# Patient Record
Sex: Female | Born: 1945 | Race: White | Hispanic: No | Marital: Married | Smoking: Never smoker
Health system: Southern US, Community
[De-identification: ages and names within clinical notes are randomized; demographics above are authoritative.]

## PROBLEM LIST (undated history)

## (undated) DIAGNOSIS — I739 Peripheral vascular disease, unspecified: Secondary | ICD-10-CM

## (undated) DIAGNOSIS — I2699 Other pulmonary embolism without acute cor pulmonale: Secondary | ICD-10-CM

## (undated) DIAGNOSIS — Z87440 Personal history of urinary (tract) infections: Secondary | ICD-10-CM

## (undated) DIAGNOSIS — Z9289 Personal history of other medical treatment: Secondary | ICD-10-CM

## (undated) DIAGNOSIS — R569 Unspecified convulsions: Secondary | ICD-10-CM

## (undated) DIAGNOSIS — R51 Headache: Secondary | ICD-10-CM

## (undated) DIAGNOSIS — I82409 Acute embolism and thrombosis of unspecified deep veins of unspecified lower extremity: Secondary | ICD-10-CM

## (undated) HISTORY — DX: Personal history of other medical treatment: Z92.89

## (undated) HISTORY — DX: Other pulmonary embolism without acute cor pulmonale: I26.99

## (undated) HISTORY — PX: OTHER SURGICAL HISTORY: SHX169

---

## 1994-12-14 HISTORY — PX: KNEE SURGERY: SHX244

## 2003-12-15 DIAGNOSIS — I2699 Other pulmonary embolism without acute cor pulmonale: Secondary | ICD-10-CM

## 2003-12-15 DIAGNOSIS — I82409 Acute embolism and thrombosis of unspecified deep veins of unspecified lower extremity: Secondary | ICD-10-CM

## 2003-12-15 HISTORY — DX: Acute embolism and thrombosis of unspecified deep veins of unspecified lower extremity: I82.409

## 2003-12-15 HISTORY — DX: Other pulmonary embolism without acute cor pulmonale: I26.99

## 2008-09-24 ENCOUNTER — Emergency Department (HOSPITAL_COMMUNITY): Admission: EM | Admit: 2008-09-24 | Discharge: 2008-09-24 | Payer: Self-pay | Admitting: Emergency Medicine

## 2008-12-14 HISTORY — PX: INSERTION OF VENA CAVA FILTER: SHX5871

## 2009-03-08 ENCOUNTER — Inpatient Hospital Stay (HOSPITAL_COMMUNITY): Admission: EM | Admit: 2009-03-08 | Discharge: 2009-03-11 | Payer: Self-pay | Admitting: Emergency Medicine

## 2009-03-08 ENCOUNTER — Ambulatory Visit: Payer: Self-pay | Admitting: Vascular Surgery

## 2009-03-09 ENCOUNTER — Ambulatory Visit: Payer: Self-pay | Admitting: Surgery

## 2009-03-09 ENCOUNTER — Encounter (INDEPENDENT_AMBULATORY_CARE_PROVIDER_SITE_OTHER): Payer: Self-pay | Admitting: Internal Medicine

## 2009-03-11 ENCOUNTER — Encounter (INDEPENDENT_AMBULATORY_CARE_PROVIDER_SITE_OTHER): Payer: Self-pay | Admitting: Internal Medicine

## 2009-03-11 DIAGNOSIS — Z9289 Personal history of other medical treatment: Secondary | ICD-10-CM

## 2009-03-11 HISTORY — DX: Personal history of other medical treatment: Z92.89

## 2009-10-08 ENCOUNTER — Ambulatory Visit (HOSPITAL_COMMUNITY): Admission: RE | Admit: 2009-10-08 | Discharge: 2009-10-08 | Payer: Self-pay | Admitting: Cardiology

## 2009-10-11 ENCOUNTER — Ambulatory Visit (HOSPITAL_COMMUNITY): Admission: RE | Admit: 2009-10-11 | Discharge: 2009-10-11 | Payer: Self-pay | Admitting: Cardiology

## 2010-01-07 ENCOUNTER — Encounter: Admission: RE | Admit: 2010-01-07 | Discharge: 2010-01-07 | Payer: Self-pay | Admitting: Interventional Radiology

## 2010-03-13 ENCOUNTER — Ambulatory Visit: Payer: Self-pay | Admitting: Oncology

## 2010-03-20 ENCOUNTER — Encounter: Admission: RE | Admit: 2010-03-20 | Discharge: 2010-03-20 | Payer: Self-pay | Admitting: Obstetrics and Gynecology

## 2011-03-19 LAB — CREATININE, SERUM
Creatinine, Ser: 0.77 mg/dL (ref 0.4–1.2)
GFR calc Af Amer: >60 mL/min (ref >60)

## 2011-03-19 LAB — BASIC METABOLIC PANEL
BUN: 16 mg/dL (ref 6–23)
Calcium: 9.1 mg/dL (ref 8.4–10.5)
Creatinine, Ser: 0.83 mg/dL (ref 0.4–1.2)

## 2011-03-19 LAB — CBC
RBC: 4.14 MIL/uL (ref 3.87–5.11)
WBC: 5.4 10*3/uL (ref 4.0–10.5)

## 2011-03-19 LAB — BUN: BUN: 13 mg/dL (ref 6–23)

## 2011-03-26 LAB — LIPID PANEL
LDL Cholesterol: 137 mg/dL — ABNORMAL HIGH (ref 0–99)
Total CHOL/HDL Ratio: 2.5 RATIO
VLDL: 9 mg/dL (ref 0–40)

## 2011-03-26 LAB — BASIC METABOLIC PANEL
BUN: 15 mg/dL (ref 6–23)
CO2: 22 mEq/L (ref 19–32)
Calcium: 8.4 mg/dL (ref 8.4–10.5)
Calcium: 8.7 mg/dL (ref 8.4–10.5)
Chloride: 103 mEq/L (ref 96–112)
Creatinine, Ser: 0.58 mg/dL (ref 0.4–1.2)
Creatinine, Ser: 0.64 mg/dL (ref 0.4–1.2)
Creatinine, Ser: 0.81 mg/dL (ref 0.4–1.2)
GFR calc Af Amer: >60 mL/min (ref >60)
GFR calc Af Amer: >60 mL/min (ref >60)
GFR calc non Af Amer: >60 mL/min (ref >60)
GFR calc non Af Amer: >60 mL/min (ref >60)
GFR calc non Af Amer: >60 mL/min (ref >60)
Glucose, Bld: 88 mg/dL (ref 70–99)
Potassium: 4.3 mEq/L (ref 3.5–5.1)
Potassium: 4.3 mEq/L (ref 3.5–5.1)
Sodium: 134 mEq/L — ABNORMAL LOW (ref 135–145)

## 2011-03-26 LAB — CBC
HCT: 32.1 % — ABNORMAL LOW (ref 36.0–46.0)
HCT: 35.3 % — ABNORMAL LOW (ref 36.0–46.0)
Hemoglobin: 11.1 g/dL — ABNORMAL LOW (ref 12.0–15.0)
Hemoglobin: 11.8 g/dL — ABNORMAL LOW (ref 12.0–15.0)
MCHC: 33.3 g/dL (ref 30.0–36.0)
MCHC: 33.7 g/dL (ref 30.0–36.0)
MCV: 87.9 fL (ref 78.0–100.0)
Platelets: 287 10*3/uL (ref 150–400)
Platelets: 301 10*3/uL (ref 150–400)
RBC: 3.74 MIL/uL — ABNORMAL LOW (ref 3.87–5.11)
RBC: 4.06 MIL/uL (ref 3.87–5.11)
RDW: 13.9 % (ref 11.5–15.5)
RDW: 14.1 % (ref 11.5–15.5)
RDW: 14.1 % (ref 11.5–15.5)
WBC: 4.7 10*3/uL (ref 4.0–10.5)
WBC: 4.7 10*3/uL (ref 4.0–10.5)
WBC: 5.4 10*3/uL (ref 4.0–10.5)

## 2011-03-26 LAB — DIFFERENTIAL
Basophils Absolute: 0 10*3/uL (ref 0.0–0.1)
Eosinophils Absolute: 0.1 10*3/uL (ref 0.0–0.7)
Eosinophils Relative: 2 % (ref 0–5)
Lymphocytes Relative: 32 % (ref 12–46)
Lymphs Abs: 1.5 10*3/uL (ref 0.7–4.0)
Monocytes Absolute: 0.3 10*3/uL (ref 0.1–1.0)
Monocytes Relative: 8 % (ref 3–12)
Neutro Abs: 2.7 10*3/uL (ref 1.7–7.7)

## 2011-03-26 LAB — TSH: TSH: 1.459 u[IU]/mL (ref 0.350–4.500)

## 2011-03-26 LAB — PROTIME-INR
INR: 1.1 (ref 0.00–1.49)
INR: 1.2 (ref 0.00–1.49)
INR: 1.4 (ref 0.00–1.49)
Prothrombin Time: 13.9 seconds (ref 11.6–15.2)
Prothrombin Time: 14.7 seconds (ref 11.6–15.2)
Prothrombin Time: 15.3 seconds — ABNORMAL HIGH (ref 11.6–15.2)
Prothrombin Time: 17.4 seconds — ABNORMAL HIGH (ref 11.6–15.2)

## 2011-03-26 LAB — ANTIPHOSPHOLIPID SYNDROME EVAL, BLD
Anticardiolipin IgA: 11 [APL'U] — ABNORMAL LOW (ref <13)
Antiphosphatidylserine IgA: <20 APS U/mL (ref <20.0)
Antiphosphatidylserine IgM: <25 MPS U/mL (ref <25.0)
Lupus Anticoagulant: NOT DETECTED

## 2011-03-26 LAB — PROTEIN C, TOTAL: Protein C, Total: 108 % (ref 70–140)

## 2011-03-26 LAB — CARDIAC PANEL(CRET KIN+CKTOT+MB+TROPI)
CK, MB: 0.5 ng/mL (ref 0.3–4.0)
CK, MB: 0.6 ng/mL (ref 0.3–4.0)
Relative Index: INVALID (ref 0.0–2.5)
Relative Index: INVALID (ref 0.0–2.5)
Total CK: 39 U/L (ref 7–177)
Troponin I: 0.01 ng/mL (ref 0.00–0.06)

## 2011-03-26 LAB — POCT I-STAT, CHEM 8
BUN: 17 mg/dL (ref 6–23)
Calcium, Ion: 1.06 mmol/L — ABNORMAL LOW (ref 1.12–1.32)
Glucose, Bld: 91 mg/dL (ref 70–99)
HCT: 37 % (ref 36.0–46.0)
Potassium: 4.8 mEq/L (ref 3.5–5.1)

## 2011-03-26 LAB — CK TOTAL AND CKMB (NOT AT ARMC)
Relative Index: INVALID (ref 0.0–2.5)
Total CK: 50 U/L (ref 7–177)

## 2011-03-26 LAB — PROTEIN S, TOTAL: Protein S Ag, Total: 167 % — ABNORMAL HIGH (ref 70–140)

## 2011-03-26 LAB — TROPONIN I: Troponin I: <0.01 ng/mL (ref 0.00–0.06)

## 2011-03-26 LAB — FACTOR 5 LEIDEN

## 2011-04-28 NOTE — Procedures (Signed)
DUPLEX DEEP VENOUS EXAM - LOWER EXTREMITY   INDICATION:  Right lower extremity pain and swelling.   HISTORY:  Edema:  Yes.  Trauma/Surgery:  No.  Pain:  Yes.  PE:  No.  Previous DVT:  Yes.  Anticoagulants:  None.  Other:   DUPLEX EXAM:                CFV   SFV   PopV  PTV    GSV                R  L  R  L  R  L  R   L  R  L  Thrombosis    +  o  +     +     +      o  Spontaneous   0  +  0     0     0      +  Phasic        0  +  0     0     0      +  Augmentation  0  +  0     0     0      +  Compressible  0  +  0     0     0      +  Competent     0  +  0     0     0      +   Legend:  + - yes  o - no  p - partial  D - decreased   IMPRESSION:  Acute deep venous thrombosis noted in the right common  femoral vein, superficial femoral vein, popliteal, posterior tibial, and  the peroneal veins.   Notified Dr. Collins Scotland with results.    _____________________________  Di Kindle. Edilia Bo, M.D.   MG/MEDQ  D:  03/08/2009  T:  03/08/2009  Job:  [id number]

## 2011-04-28 NOTE — H&P (Signed)
Evelyn [name], Evelyn [name]              ACCOUNT NO.:  [id number]   MEDICAL RECORD NO.:  [id number]          PATIENT TYPE:  INP   LOCATION:  1832                         FACILITY:  [facility]   PHYSICIAN:  Eduard Clos, MDDATE OF BIRTH:  11-May-1946   DATE OF ADMISSION:  03/08/2009  DATE OF DISCHARGE:                              HISTORY & PHYSICAL   PRIMARY CARE PHYSICIAN:  At [facility].  She was seen yesterday by Dr. Herb Grays.   CHIEF COMPLAINT:  Swelling of the right lower extremity.   HISTORY OF PRESENT ILLNESS:  A 65 year old female with a history of  epilepsy since age 24, has been seizure free for the last 8 years,  history of previous DVT of the left lower extremity 4 years ago had gone  to Dr. Herb Grays yesterday after the patient was found to have  increased swelling in the right calf over the last 10 days.  The patient  originally had urinary respiratory tract infection over the last 64-month  that was treated with antibiotics.  She was very fatigued and was  spending most of the time in the bed.  She started noticing swelling in  the right lower extremity for the last week and a half and decided to go  to Dr. Herb Grays yesterday, who referred her to a vein specialist  where she had a Doppler done today morning which showed extensive clot  in the right lower extremity and was referred to the ER.   The patient also stated that she did have some pleuritic chest pain 3-  days ago, which resolved spontaneously.  The patient had a CT of the  chest which showed extensive bilateral pulmonary embolism.  The patient  has been admitted for further observation.  The patient personally  denies any chest pain, shortness of breath, denies any tachycardia,  dizziness, loss of consciousness, weakness of limbs.  Denies any  abdominal pain, nausea, vomiting, diarrhea, fever, chills or cough.   PAST MEDICAL HISTORY:  1. History of DVT 4 years ago.  She was treated with Coumadin for 3-4   months and was stopped.  2. History of epilepsy since age 47, seizure free for the last 8      years.   PAST SURGICAL HISTORY:  The patient had a left knee surgery.   MEDICATIONS PRIOR TO ADMISSION:  1. Topamax 75 mg in a.m. and 50 in p.m.  2. Mysoline 250 mg in a.m. and 1/2 in evening and 1 at night.  3. Lamictal 250 mg p.o. b.i.d.  4. Benadryl 25 mg p.o. at bedtime p.r.n. insomnia.   ALLERGIES:  TEGRETOL.   SOCIAL HISTORY:  The patient denies smoking cigarettes, drinking alcohol  or using illegal drugs.   FAMILY HISTORY:  Not significant for any pulmonary embolism, MI or CAD.   REVIEW OF SYSTEMS:  As per history of present illness, nothing else  significant.   PHYSICAL EXAMINATION:  GENERAL:  Patient examined at bedside, not in  acute distress.  VITAL SIGNS:  Blood pressure 134/76, pulse 80 per minute, temperature  97, respirations 18 per minute.  O2 saturations 100%.  HEENT:  Anicteric.  No pallor.  CHEST:  Bilateral air entry present.  No rhonchi, no crepitation.  HEART:  S1-S2 heard.  ABDOMEN:  Soft, nontender.  Bowel sounds heard.  CNS:  Alert, awake, oriented to time, place and person.  Moves upper and  lower extremities 5/5.  EXTREMITIES:  Peripheral pulses felt.  Mild swelling in the right lower  extremity.  Peripheral pulses felt.   LABORATORY DATA:  CT angio of the chest is positive for extensive  pulmonary embolism.  The patient had an outpatient Doppler of the lower  extremities; results are not available, but per ER physician it was  positive for right lower extremity DVT extensive.  CBC - WBC 4.7,  hemoglobin 12.6, hematocrit 37, platelets 310.  Basic metabolic panel;  sodium 134, potassium 4.8, chloride 109, glucose 91, BUN 17, creatinine  0.9.   ASSESSMENT:  1. Bilateral extensive pulmonary embolism.  The patient is      hemodynamically stable.  2. Right lower extremity deep venous thrombosis.  3. History of previous deep venous thrombosis in the left  lower      extremity.  4. Epilepsy, seizure free for 8 years.   PLAN:  Will admit patient to telemetry.  Start patient on Coumadin and  Lovenox.  Get a 2-D echo to check for any right ventricular strain or  pulmonary hypertension.  Will add labs for factor V Leiden, antithrombin  III, protein C, protein S, and antiphospholipid antibodies.  Further  recommendation as condition evolves.  I have explained to the patient as  this is the second episode, the patient will probably will need Coumadin  lifelong.      Eduard Clos, MD  Electronically Signed     ANK/MEDQ  D:  03/08/2009  T:  03/08/2009  Job:  [id number]

## 2011-04-28 NOTE — Discharge Summary (Signed)
Evelyn [name], Evelyn [name]              ACCOUNT NO.:  [id number]   MEDICAL RECORD NO.:  [id number]          PATIENT TYPE:  INP   LOCATION:  3736                         FACILITY:  [facility]   PHYSICIAN:  Isidor Holts, M.D.  DATE OF BIRTH:  [birth year]   DATE OF ADMISSION:  03/08/2009  DATE OF DISCHARGE:  03/11/2009                               DISCHARGE SUMMARY   PRIMARY MEDICAL DOCTOR:  Tammy R. Collins Scotland, M.D.   DISCHARGE DIAGNOSES:  1. Right lower extremity deep venous thrombosis/bilateral pulmonary      emboli, status post inferior vena cava filter, March 10, 2009.  2. History of previous left lower extremity deep venous thrombosis.  3. Seizure disorder.  4. Remote history of left knee surgery.   DISCHARGE MEDICATIONS:  1. Topamax 75 mg p.o. q.a.m. and 50 mg p.o. q.p.m.  2. Mysoline 250 mg p.o. q.a.m., 125 mg p.o. q.p.m., 250 mg p.o.      bedtime.  3. Lamictal 250 mg p.o. b.i.d.  4. Benadryl 25 mg p.o. p.r.n. at bedtime.  5. Darvocet-N 100 one p.o. p.r.n. q.6 h. A total of 20 pills have been      dispensed.  6. Coumadin 10 mg p.o. at 6 p.m. daily, otherwise per INR.  7. Lovenox 100 mg subcutaneously daily until INR equal 2-3 for 48      hours, then stop.   PROCEDURES:  1. Chest CT angiogram dated March 08, 2009.  This showed extensive      pulmonary embolus with large clot identified in the right main      pulmonary artery and descending right interlobar artery.  Clot is      also seen in the descending left intralobar artery.  Lungs      demonstrate only some slight atelectatic change, no nodule, mass or      consolidative process, airways unremarkable.  2. Bilateral lower extremity venous Doppler dated March 09, 2009.      This showed occlusive clot in the posterior right tibial vein,      popliteal profunda and femoral veins.  The common femoral vein is      partially occluded.  No DVT was noted in the left lower extremity.  3. Ultrasound-guided IVC filter placement done March 10, 2009 by Dr.      Richarda Overlie, interventional radiologist.  This was an uncomplicated      procedure with satisfactory deployment of retrievable IVC filter.   CONSULTATIONS:  Dr. Abundio Miu, interventional radiologist.   ADMISSION HISTORY:  As in H and P note of March 08, 2009 dictated by Dr.  Midge Minium.  However, in brief, this 65 year old female, with  known history of seizure disorder diagnosed since age 64 years, now  seizure-free for the past 8 years, history of remote left knee surgery  approximately 10 years ago, status post DVT left lower extremity  approximately 4-5 years ago, treated with Coumadin anticoagulation for  about 3-4 months, now presenting with right calf swelling of  approximately 10 days duration.  She had experienced pleuritic-type  chest pain approximately 3 days ago, which has since  resolved.  She was  seen by her primary MD, Dr. Herb Grays, who then referred her to a vein  specialist, where she had the left Doppler done in the a.m. of March 08, 2009 showing extensive clot in the right lower extremity.  She was  referred to the emergency department and subsequent chest CT angiogram  demonstrated bilateral PE.  She was admitted for further evaluation,  investigation and management.   CLINICAL COURSE:  1. Venous thromboembolic disease:  For details of the presentation,      refer to admission history above.  The patient's chest CT angiogram      as mentioned above, confirmed bilateral pulmonary emboli.  For      details, refer to procedure list above.  The patient was placed on      concomitant subcutaneous Lovenox and p.o. Coumadin.  She remained      hemodynamically stable, and as of March 11, 2009 appeared      asymptomatic, had no discernible shortness of breath or chest pain.      Lower extremity venous Doppler confirmed extensive right lower      extremity DVT and the patient underwent IVC filter placement on      March 10, 2009, an  uncomplicated procedure carried out by Dr. Richarda Overlie, interventional radiologist.  As of the a.m. of March 11, 2009, INR was still subtherapeutic at 1.4, and the patient was on      day #4 of Lovenox treatment.  She is anticipated to complete a      mandatory 5-day Lovenox overlap with Coumadin on March 12, 2009,      but Lovenox is to be continued until INR is therapeutic at between      2-3, for 48 hours.  This has been communicated to patient.   1. Seizure disorder:  There were no problems referable to this.   DISPOSITION:  The patient was on March 11, 2009 asymptomatic, very keen  to go home, considered clinically stable, and therefore discharged  accordingly.  Vitals in the a.m. of March 11, 2009 showed a BP of  122/76, pulse rate was  normal at 79, respiratory rate was 18, and the  patient was saturating at 98% on room air.  Although thrombophilic  profile was ordered at the time of presentation, as of the date of this  dictation on March 11, 2009 results were still pending.  The patient did  undergo 2D echocardiogram in the a.m. of March 11, 2009, and report is  still pending.  Given the fact that this is recurrent venous  thromboembolic disease and a large clot burden, it is anticipated that  the patient will be on long-term, i.e., lifelong anticoagulation.  However, final decision will be deferred to patient's primary MD.  The  patient is recommended to abstain from her regular duties for at least 1  week and then thereafter, as indicated by her primary MD.   ACTIVITY:  As tolerated.  Recommended to increase activity slowly.   DIET:  No restrictions.   FOLLOW-UP INSTRUCTIONS:  The patient is to follow up with her primary  MD, Dr. Herb Grays within 1 week of discharge.  She has been instructed  to call for an appointment.  In addition, she has been instructed to  have INR checked at Dr. Alda Berthold office in the a.m. of March 13, 2009.  Dr. Collins Scotland will adjust her  Coumadin  dosage thereafter, as indicated.  All  this has been communicated to patient and her spouse.  They verbalized  understanding.      Isidor Holts, M.D.  Electronically Signed     CO/MEDQ  D:  03/11/2009  T:  03/11/2009  Job:  [id number]   cc:   Tammy R. Collins Scotland, M.D.

## 2013-02-01 ENCOUNTER — Encounter (HOSPITAL_COMMUNITY): Payer: Self-pay | Admitting: Emergency Medicine

## 2013-02-01 ENCOUNTER — Emergency Department (HOSPITAL_COMMUNITY)
Admission: EM | Admit: 2013-02-01 | Discharge: 2013-02-01 | Disposition: A | Discharge status: Home / Self Care | Payer: BC Managed Care – PPO | Attending: Emergency Medicine | Admitting: Emergency Medicine

## 2013-02-01 ENCOUNTER — Emergency Department (HOSPITAL_COMMUNITY): Payer: BC Managed Care – PPO

## 2013-02-01 DIAGNOSIS — Y9389 Activity, other specified: Secondary | ICD-10-CM | POA: Insufficient documentation

## 2013-02-01 DIAGNOSIS — Z86718 Personal history of other venous thrombosis and embolism: Secondary | ICD-10-CM | POA: Insufficient documentation

## 2013-02-01 DIAGNOSIS — Z7901 Long term (current) use of anticoagulants: Secondary | ICD-10-CM | POA: Insufficient documentation

## 2013-02-01 DIAGNOSIS — I82409 Acute embolism and thrombosis of unspecified deep veins of unspecified lower extremity: Secondary | ICD-10-CM | POA: Insufficient documentation

## 2013-02-01 DIAGNOSIS — S42209A Unspecified fracture of upper end of unspecified humerus, initial encounter for closed fracture: Secondary | ICD-10-CM | POA: Insufficient documentation

## 2013-02-01 DIAGNOSIS — Y9239 Other specified sports and athletic area as the place of occurrence of the external cause: Secondary | ICD-10-CM | POA: Insufficient documentation

## 2013-02-01 DIAGNOSIS — Z79899 Other long term (current) drug therapy: Secondary | ICD-10-CM | POA: Insufficient documentation

## 2013-02-01 DIAGNOSIS — G40909 Epilepsy, unspecified, not intractable, without status epilepticus: Secondary | ICD-10-CM | POA: Insufficient documentation

## 2013-02-01 DIAGNOSIS — W1789XA Other fall from one level to another, initial encounter: Secondary | ICD-10-CM | POA: Insufficient documentation

## 2013-02-01 HISTORY — DX: Acute embolism and thrombosis of unspecified deep veins of unspecified lower extremity: I82.409

## 2013-02-01 HISTORY — DX: Unspecified convulsions: R56.9

## 2013-02-01 MED ORDER — OXYCODONE-ACETAMINOPHEN 5-325 MG PO TABS: 1.0000 | ORAL_TABLET | ORAL | Status: DC | PRN

## 2013-02-01 MED ORDER — HYDROCODONE-ACETAMINOPHEN 5-325 MG PO TABS
1.0000 | ORAL_TABLET | Freq: Once | ORAL | Status: AC
  Administered 2013-02-01: 1 via ORAL
  Filled 2013-02-01: qty 1

## 2013-02-01 NOTE — ED Provider Notes (Signed)
History  This chart was scribed for Evelyn Racer, MD by Shari Heritage, ED Scribe. The patient was seen in room D35C/D35C. Patient's care was started at 1717.   CSN: [id number]  Arrival date & time 02/01/13  1716   First MD Initiated Contact with Patient 02/01/13 1717      Chief Complaint  Patient presents with  . Fall    Patient is a 67 y.o. female presenting with fall. The history is provided by the patient. No language interpreter was used.  Fall The accident occurred 1 to 2 hours ago. The fall occurred while recreating/playing. She fell from a height of 3 to 5 ft. She landed on grass. There was no blood loss. The point of impact was the right shoulder. The pain is moderate. Pertinent negatives include no numbness, no headaches, no loss of consciousness and no tingling. She has tried immobilization for the symptoms.    HPI Comments: Shirlean Berman is a 67 y.o. female with history of DVT in bilateral lower extremities and seizures who presents to the Emergency Department complaining of a moderate to severe, constant, dull right shoulder and upper arm pain resulting from a fall that occurred 2 hours ago. Patient states that she was bending over to adjust a yard boulder when she lost her balance and fell landing directly onto her shoulder. She denies head injury or LOC. No neck pain, back pain, headaches, numbness, weakness or tingling. Patient is currently on Coumadin. Last INR was yesterday, 2.44. She reports no other significant past medical or surgical history.    Past Medical History  Diagnosis Date  . Seizures   . DVT (deep venous thrombosis)     History reviewed. No pertinent past surgical history.  History reviewed. No pertinent family history.  History  Substance Use Topics  . Smoking status: Not on file  . Smokeless tobacco: Not on file  . Alcohol Use: Not on file    OB History   Grav Para Term Preterm Abortions TAB SAB Ect Mult Living                  Review  of Systems  HENT: Negative for neck pain.   Musculoskeletal: Negative for back pain.  Neurological: Negative for tingling, loss of consciousness, weakness, numbness and headaches.  All other systems reviewed and are negative.    Allergies  Tegretol  Home Medications  No current outpatient prescriptions on file.  Triage Vitals: Pulse 71  Temp(Src) 98.6 F (37 C) (Oral)  Resp 16  SpO2 99%  Physical Exam  Constitutional: She is oriented to person, place, and time. She appears well-developed and well-nourished.  HENT:  Head: Normocephalic and atraumatic.  No head or neck trauma.  Eyes: Conjunctivae and EOM are normal. Pupils are equal, round, and reactive to light.  Neck: Normal range of motion. Neck supple.  Cardiovascular: Normal rate, regular rhythm, normal heart sounds and intact distal pulses.   Pulmonary/Chest: Effort normal and breath sounds normal.  Abdominal: Soft.  Musculoskeletal: She exhibits tenderness.       Cervical back: She exhibits no bony tenderness.  Tenderness at the proximal right humerus without deformity. No clavicular pain. No posterior cervical midline tenderness.   Neurological: She is alert and oriented to person, place, and time.  Good grip strength. ROM fully intact. Sensation intact. 5/5 strength throughout.  Skin: Skin is warm and dry.    ED Course  Procedures (including critical care time) DIAGNOSTIC STUDIES: Oxygen Saturation is 99% on room  air, normal by my interpretation.    COORDINATION OF CARE: 5:31 PM- Patient informed of current plan for treatment and evaluation and agrees with plan at this time.   6:40 PM- Updated patient on results of x-ray which shows proximal humeral fracture. Have ordered foam sling and long arm splint. Will give 1 tablet of Vicodin 5-325 mg. Patient will follow up with orthopedics. Advised to return for worsening pain or numbness.  9:37 PM- Spoke with ortho who has reviewed patient's chart and has agreed to see  her for followup.   Dg Humerus Right  02/01/2013  *RADIOLOGY REPORT*  Clinical Data: Larey Seat.  RIGHT HUMERUS - 2+ VIEW  Comparison: None  Findings: There is a comminuted fracture of the humeral neck.  This is not appeared evolve humeral head.  The [facility] joint and glenohumeral joints are intact.  The right lung is clear.  IMPRESSION: Comminuted and displaced spiral type fracture involving the humeral neck and upper humeral shaft.   Original Report Authenticated By: Rudie Meyer, M.D.      1. Proximal humerus fracture, right, closed, initial encounter       MDM  I personally performed the services described in this documentation, which was scribed in my presence. The recorded information has been reviewed and is accurate.     Evelyn Racer, MD 02/01/13 [phone]

## 2013-02-01 NOTE — ED Notes (Signed)
Per EMS: pt fell while bending over to pick up a rock; pt c/o right shoulder pain; no obvious deformity noted; pt given fentanyl in route; denies LOC; pt takes coumadin

## 2013-02-01 NOTE — ED Notes (Signed)
Was discharging pt when Dr. Ranae Palms came into room and st's ortho MD is coming to see pt.

## 2013-02-01 NOTE — Progress Notes (Signed)
Orthopedic Tech Progress Note Patient Details:  Evelyn [name] 14-Jun-1946 [id number]  Ortho Devices Type of Ortho Device: Ace wrap;Arm sling;Post (long arm) splint Ortho Device/Splint Location: (R) UE Ortho Device/Splint Interventions: Application   Jennye Moccasin 02/01/2013, 7:04 PM

## 2013-03-01 ENCOUNTER — Ambulatory Visit: Payer: Self-pay | Admitting: Cardiology

## 2013-03-01 DIAGNOSIS — I82409 Acute embolism and thrombosis of unspecified deep veins of unspecified lower extremity: Secondary | ICD-10-CM | POA: Insufficient documentation

## 2013-03-01 HISTORY — DX: Acute embolism and thrombosis of unspecified deep veins of unspecified lower extremity: I82.409

## 2013-05-09 ENCOUNTER — Encounter: Payer: Self-pay | Admitting: *Deleted

## 2013-05-10 ENCOUNTER — Encounter: Payer: Self-pay | Admitting: Cardiovascular Disease

## 2013-05-10 ENCOUNTER — Ambulatory Visit (INDEPENDENT_AMBULATORY_CARE_PROVIDER_SITE_OTHER): Payer: BC Managed Care – PPO | Admitting: Pharmacist Clinician (PhC)/ Clinical Pharmacy Specialist

## 2013-05-10 ENCOUNTER — Encounter: Payer: Self-pay | Admitting: Cardiology

## 2013-05-10 ENCOUNTER — Ambulatory Visit (INDEPENDENT_AMBULATORY_CARE_PROVIDER_SITE_OTHER): Payer: BC Managed Care – PPO | Admitting: Cardiovascular Disease

## 2013-05-10 VITALS — BP 110/68 | HR 71 | Ht 66.0 in | Wt 144.0 lb

## 2013-05-10 DIAGNOSIS — I82403 Acute embolism and thrombosis of unspecified deep veins of lower extremity, bilateral: Secondary | ICD-10-CM

## 2013-05-10 DIAGNOSIS — Z7901 Long term (current) use of anticoagulants: Secondary | ICD-10-CM

## 2013-05-10 DIAGNOSIS — I82409 Acute embolism and thrombosis of unspecified deep veins of unspecified lower extremity: Secondary | ICD-10-CM

## 2013-05-10 DIAGNOSIS — I2699 Other pulmonary embolism without acute cor pulmonale: Secondary | ICD-10-CM | POA: Insufficient documentation

## 2013-05-10 HISTORY — DX: Other pulmonary embolism without acute cor pulmonale: I26.99

## 2013-05-10 NOTE — Progress Notes (Signed)
05/10/2013 Dairl Ponder   1946/08/19  [id number]  Primary Physician Hoyle Sauer, MD Primary Cardiologist: Runell Gess MD Roseanne Reno   HPI:  Ms. 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 Onalee Hua Hardings. She has a history of bilateral DVT and pulmonary emboli on Coumadin anticoagulation status post IVC filter placement. She has no other cardiac risk factors. She apparently had a hypercoagulable workup by Dr. Isaiah Serge at [facility] however she is unaware of the findings.   Current Outpatient Prescriptions  Medication Sig Dispense Refill  . cetirizine (ZYRTEC) 10 MG tablet Take 10 mg by mouth daily.      Marland Kitchen doxylamine, Sleep, (UNISOM) 25 MG tablet Take 25 mg by mouth at bedtime as needed for sleep.      Marland Kitchen HYDROcodone-acetaminophen (NORCO/VICODIN) 5-325 MG per tablet Take 1 tablet by mouth every 6 (six) hours as needed (headaches).      . lamoTRIgine (LAMICTAL) 100 MG tablet Take 50 mg by mouth 2 (two) times daily. Takes with 200mg  tablets for a total dose of 250mg  twice a day      . lamoTRIgine (LAMICTAL) 200 MG tablet Take 200 mg by mouth 2 (two) times daily. Takes with 50mg  tablets for a total dose of 250mg  twice a day      . primidone (MYSOLINE) 250 MG tablet Take 250-375 mg by mouth 3 (three) times daily. 1 tablet in the morning; 1.5 tablets in the evening      . topiramate (TOPAMAX) 25 MG tablet Take 50-75 mg by mouth 2 (two) times daily. 3 tablets in the morning; 2 tablets in the evening      . warfarin (COUMADIN) 7.5 MG tablet Take 11.25-15 mg by mouth daily. 1 & 1/2 mon, wed, fri and 2 tablets all other days       No current facility-administered medications for this visit.    Allergies  Allergen Reactions  . Tegretol (Carbamazepine)     History   Social History  . Marital Status: Married    Spouse Name: N/A    Number of  Children: N/A  . Years of Education: N/A   Occupational History  . Not on file.   Social History Main Topics  . Smoking status: Never Smoker   . Smokeless tobacco: Not on file  . Alcohol Use: Not on file  . Drug Use: Not on file  . Sexually Active: Not on file   Other Topics Concern  . Not on file   Social History Narrative  . No narrative on file     Review of Systems: General: negative for chills, fever, night sweats or weight changes.  Cardiovascular: negative for chest pain, dyspnea on exertion, edema, orthopnea, palpitations, paroxysmal nocturnal dyspnea or shortness of breath Dermatological: negative for rash Respiratory: negative for cough or wheezing Urologic: negative for hematuria Abdominal: negative for nausea, vomiting, diarrhea, bright red blood per rectum, melena, or hematemesis Neurologic: negative for visual changes, syncope, or dizziness All other systems reviewed and are otherwise negative except as noted above.    Blood pressure 110/68, pulse 71, height 5\' 6"  (1.676 m), weight 144 lb (65.318 kg).  General appearance: alert and no distress Neck: no adenopathy, no carotid bruit, no JVD, supple, symmetrical, trachea midline and thyroid not enlarged, symmetric, no tenderness/mass/nodules Lungs: clear to auscultation bilaterally Heart: regular rate and rhythm, S1, S2 normal, no murmur, click,  rub or gallop Abdomen: soft, non-tender; bowel sounds normal; no masses,  no organomegaly Extremities: extremities normal, atraumatic, no cyanosis or edema Pulses: 2+ and symmetric  EKG normal sinus rhythm at 71 without ST or T wave changes  ASSESSMENT AND PLAN:   DVT (deep venous thrombosis) Stable on Coumadin anticoagulation. She is not a candidate for the novel oral anticoagulants because of her seizure medications.      Runell Gess MD FACP,FACC,FAHA, [facility] 05/10/2013 4:37 PM

## 2013-05-10 NOTE — Patient Instructions (Addendum)
Your physician wants you to follow-up in:  12 months.  You will receive a reminder letter in the mail two months in advance. If you don't receive a letter, please call our office to schedule the follow-up appointment.   

## 2013-05-10 NOTE — Assessment & Plan Note (Signed)
Stable on Coumadin anticoagulation. She is not a candidate for the novel oral anticoagulants because of her seizure medications.

## 2013-05-15 ENCOUNTER — Ambulatory Visit: Payer: BC Managed Care – PPO | Admitting: Pharmacist Clinician (PhC)/ Clinical Pharmacy Specialist

## 2013-05-17 ENCOUNTER — Other Ambulatory Visit: Payer: Self-pay | Admitting: Pharmacist Clinician (PhC)/ Clinical Pharmacy Specialist

## 2013-05-17 MED ORDER — WARFARIN SODIUM 7.5 MG PO TABS: 11.2500 mg | ORAL_TABLET | Freq: Every day | ORAL | Status: DC

## 2013-05-22 ENCOUNTER — Other Ambulatory Visit: Payer: Self-pay | Admitting: Orthopedic Surgery

## 2013-05-22 DIAGNOSIS — M79621 Pain in right upper arm: Secondary | ICD-10-CM

## 2013-05-23 ENCOUNTER — Ambulatory Visit
Admission: RE | Admit: 2013-05-23 | Discharge: 2013-05-23 | Disposition: A | Discharge status: Home / Self Care | Payer: BC Managed Care – PPO | Source: Ambulatory Visit | Attending: Orthopedic Surgery | Admitting: Orthopedic Surgery

## 2013-05-23 ENCOUNTER — Other Ambulatory Visit: Payer: Self-pay | Admitting: Orthopedic Surgery

## 2013-05-23 DIAGNOSIS — M79621 Pain in right upper arm: Secondary | ICD-10-CM

## 2013-06-02 ENCOUNTER — Ambulatory Visit (INDEPENDENT_AMBULATORY_CARE_PROVIDER_SITE_OTHER): Payer: BC Managed Care – PPO | Admitting: Pharmacist Clinician (PhC)/ Clinical Pharmacy Specialist

## 2013-06-02 VITALS — BP 110/62 | HR 76

## 2013-06-02 DIAGNOSIS — Z7901 Long term (current) use of anticoagulants: Secondary | ICD-10-CM

## 2013-06-02 DIAGNOSIS — I82409 Acute embolism and thrombosis of unspecified deep veins of unspecified lower extremity: Secondary | ICD-10-CM

## 2013-06-02 LAB — POCT INR: INR: 3.2

## 2013-06-26 ENCOUNTER — Ambulatory Visit (INDEPENDENT_AMBULATORY_CARE_PROVIDER_SITE_OTHER): Payer: BC Managed Care – PPO | Admitting: Pharmacist Clinician (PhC)/ Clinical Pharmacy Specialist

## 2013-06-26 VITALS — BP 110/62 | HR 68

## 2013-06-26 DIAGNOSIS — Z7901 Long term (current) use of anticoagulants: Secondary | ICD-10-CM

## 2013-06-26 DIAGNOSIS — I82409 Acute embolism and thrombosis of unspecified deep veins of unspecified lower extremity: Secondary | ICD-10-CM

## 2013-07-11 ENCOUNTER — Ambulatory Visit (INDEPENDENT_AMBULATORY_CARE_PROVIDER_SITE_OTHER): Payer: BC Managed Care – PPO | Admitting: Pharmacist Clinician (PhC)/ Clinical Pharmacy Specialist

## 2013-07-11 DIAGNOSIS — Z7901 Long term (current) use of anticoagulants: Secondary | ICD-10-CM

## 2013-07-11 DIAGNOSIS — I82409 Acute embolism and thrombosis of unspecified deep veins of unspecified lower extremity: Secondary | ICD-10-CM

## 2013-07-11 LAB — POCT INR: INR: 2.3

## 2013-07-17 ENCOUNTER — Telehealth: Payer: Self-pay | Admitting: Pharmacist Clinician (PhC)/ Clinical Pharmacy Specialist

## 2013-07-17 NOTE — Telephone Encounter (Signed)
She is having Ortho Surgery on 8-19-Need to know how to handle her Coumadin.

## 2013-07-18 NOTE — Telephone Encounter (Signed)
Will send to Dr. Allyson Sabal for clearance.

## 2013-07-19 ENCOUNTER — Encounter (HOSPITAL_COMMUNITY): Payer: Self-pay | Admitting: Pharmacy Technician

## 2013-07-21 ENCOUNTER — Other Ambulatory Visit (HOSPITAL_COMMUNITY): Payer: BC Managed Care – PPO

## 2013-07-21 NOTE — Telephone Encounter (Signed)
Will need Lovenox bridging

## 2013-07-22 NOTE — Pre-Procedure Instructions (Addendum)
Evelyn [name]  07/22/2013   Your procedure is scheduled on: August 19  Report to [facility] at 06:00 AM.  Call this number if you have problems the morning of surgery: [phone]   Remember:   Do not eat food or drink liquids after midnight.   Take these medicines the morning of surgery with A SIP OF WATER: Follow Lovenox Bridge Instructions by Dr. Hazle Coca office, Zyrtec, Hydrocodone or Oxycodone (if needed), Lamictal, Primidone, Topamax    STOP Hair Volume pill, Omega 3, Multiple Vitamins, Co Q10, Calcium with vitamin D, Glucosamine August 1/14  Do not wear jewelry, make-up or nail polish.  Do not wear lotions, powders, or perfumes. You may wear deodorant.  Do not shave 48 hours prior to surgery. Men may shave face and neck.  Do not bring valuables to the hospital.  [facility] is not responsible for any belongings or valuables.  Contacts, dentures or bridgework may not be worn into surgery.  Leave suitcase in the car. After surgery it may be brought to your room.  For patients admitted to the hospital, checkout time is 11:00 AM the day of discharge.   Patients discharged the day of surgery will not be allowed to drive home.  Name and phone number of your driver: Family/ Friend  Special Instructions: Shower using CHG 2 nights before surgery and the night before surgery.  If you shower the day of surgery use CHG.  Use special wash - you have one bottle of CHG for all showers.  You should use approximately 1/3 of the bottle for each shower.   Please read over the following fact sheets that you were given: Pain Booklet, Coughing and Deep Breathing and Surgical Site Infection Prevention

## 2013-07-24 ENCOUNTER — Encounter (HOSPITAL_COMMUNITY)
Admission: RE | Admit: 2013-07-24 | Discharge: 2013-07-24 | Disposition: A | Discharge status: Home / Self Care | Payer: BC Managed Care – PPO | Source: Ambulatory Visit | Attending: Anesthesiology | Admitting: Anesthesiology

## 2013-07-24 ENCOUNTER — Encounter (HOSPITAL_COMMUNITY)
Admission: RE | Admit: 2013-07-24 | Discharge: 2013-07-24 | Disposition: A | Discharge status: Home / Self Care | Payer: BC Managed Care – PPO | Source: Ambulatory Visit | Attending: Orthopedic Surgery | Admitting: Orthopedic Surgery

## 2013-07-24 ENCOUNTER — Encounter: Payer: Self-pay | Admitting: Pharmacist Clinician (PhC)/ Clinical Pharmacy Specialist

## 2013-07-24 ENCOUNTER — Encounter (HOSPITAL_COMMUNITY): Payer: Self-pay

## 2013-07-24 DIAGNOSIS — Z01812 Encounter for preprocedural laboratory examination: Secondary | ICD-10-CM | POA: Insufficient documentation

## 2013-07-24 DIAGNOSIS — Z01818 Encounter for other preprocedural examination: Secondary | ICD-10-CM | POA: Insufficient documentation

## 2013-07-24 HISTORY — DX: Personal history of urinary (tract) infections: Z87.440

## 2013-07-24 HISTORY — DX: Peripheral vascular disease, unspecified: I73.9

## 2013-07-24 HISTORY — DX: Headache: R51

## 2013-07-24 LAB — URINALYSIS, ROUTINE W REFLEX MICROSCOPIC
Glucose, UA: NEGATIVE mg/dL
Leukocytes, UA: NEGATIVE
Nitrite: NEGATIVE
Protein, ur: NEGATIVE mg/dL
pH: 7 (ref 5.0–8.0)

## 2013-07-24 LAB — BASIC METABOLIC PANEL
CO2: 24 mEq/L (ref 19–32)
Calcium: 9.1 mg/dL (ref 8.4–10.5)
Creatinine, Ser: 0.65 mg/dL (ref 0.50–1.10)
GFR calc Af Amer: >90 mL/min (ref >90)
Sodium: 137 mEq/L (ref 135–145)

## 2013-07-24 LAB — CBC
HCT: 35.3 % — ABNORMAL LOW (ref 36.0–46.0)
Hemoglobin: 11.8 g/dL — ABNORMAL LOW (ref 12.0–15.0)
MCH: 29.1 pg (ref 26.0–34.0)
MCHC: 33.4 g/dL (ref 30.0–36.0)
MCV: 86.9 fL (ref 78.0–100.0)
Platelets: 202 10*3/uL (ref 150–400)
RBC: 4.06 MIL/uL (ref 3.87–5.11)
RDW: 15.3 % (ref 11.5–15.5)
WBC: 5.5 10*3/uL (ref 4.0–10.5)

## 2013-07-24 LAB — APTT: aPTT: 35 seconds (ref 24–37)

## 2013-07-24 LAB — PROTIME-INR
INR: 2.15 — ABNORMAL HIGH (ref 0.00–1.49)
Prothrombin Time: 23.3 seconds — ABNORMAL HIGH (ref 11.6–15.2)

## 2013-07-24 MED ORDER — ENOXAPARIN SODIUM 60 MG/0.6ML ~~LOC~~ SOLN: 60.0000 mg | Freq: Two times a day (BID) | SUBCUTANEOUS | Status: DC

## 2013-07-24 NOTE — Telephone Encounter (Signed)
Spoke with patient, will pick up lovenox dosing schedule Wednesday and I will review with her at that time.  Has done injections in the past.  Rx sent to [facility]

## 2013-07-24 NOTE — Telephone Encounter (Signed)
I spoke with Evelyn [name] and Evelyn [name] about the lovenox bridging.  They are agreeable with the plan.  I also spoke with [facility] and informed her of the lovenox bridging

## 2013-07-24 NOTE — Telephone Encounter (Signed)
Please advise 

## 2013-07-24 NOTE — Progress Notes (Signed)
No orders at peadmit. Office called

## 2013-07-25 NOTE — Progress Notes (Signed)
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 Carola Frost.   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 Isaiah Serge at [facility]), seizure disorder, non-smoker, headaches.  PCP is Dr. Felipa Eth.  Cardiologist is Dr. Nanetta Batty.  He is aware of plans for surgery and recommended a Lovenox bridge.   EKG on 05/10/13 showed NSR, septal infarct (age undetermined) with T wave abnormality.  T wave abnormality is more apparent in V2, otherwise overall stable since 03/25/12.  Echo on 03/11/09 showed:  Overall left ventricular systolic function was normal. Left ventricular ejection fraction was estimated , range being 60% to 65 %. There were no left ventricular regional wall motion abnormalities. Left ventricular diastolic function parameters were normal.  CXR on 07/24/13 showed no active disease.  Preoperative labs noted.  PT/INR elevated. She is due to meet with [facility] for Lovenox bridging instructions on 07/26/13.  Will plan to repeat PT/PTT on arrival the day of surgery and proceed if results are reasonable.  Velna Ochs [facility] Short Stay Center/Anesthesiology Phone [phone] 07/25/2013 1:41 PM

## 2013-07-31 MED ORDER — CEFAZOLIN SODIUM-DEXTROSE 2-3 GM-% IV SOLR
2.0000 g | INTRAVENOUS | Status: AC
  Administered 2013-08-01: 2 g via INTRAVENOUS
  Filled 2013-07-31: qty 50

## 2013-07-31 MED ORDER — LACTATED RINGERS IV SOLN: INTRAVENOUS | Status: DC

## 2013-07-31 MED ORDER — CHLORHEXIDINE GLUCONATE 4 % EX LIQD: 60.0000 mL | Freq: Once | CUTANEOUS | Status: DC

## 2013-08-01 ENCOUNTER — Ambulatory Visit (HOSPITAL_COMMUNITY): Payer: BC Managed Care – PPO

## 2013-08-01 ENCOUNTER — Encounter (HOSPITAL_COMMUNITY): Payer: Self-pay | Admitting: Vascular Surgery

## 2013-08-01 ENCOUNTER — Ambulatory Visit (HOSPITAL_COMMUNITY)
Admission: RE | Admit: 2013-08-01 | Discharge: 2013-08-01 | Disposition: A | Discharge status: Home / Self Care | Payer: BC Managed Care – PPO | Source: Ambulatory Visit | Attending: Orthopedic Surgery | Admitting: Orthopedic Surgery

## 2013-08-01 ENCOUNTER — Encounter (HOSPITAL_COMMUNITY): Payer: Self-pay | Admitting: Surgery

## 2013-08-01 ENCOUNTER — Ambulatory Visit (HOSPITAL_COMMUNITY): Payer: BC Managed Care – PPO | Admitting: Certified Registered"

## 2013-08-01 ENCOUNTER — Encounter (HOSPITAL_COMMUNITY)
Admission: RE | Disposition: A | Discharge status: Home / Self Care | Payer: Self-pay | Source: Ambulatory Visit | Attending: Orthopedic Surgery

## 2013-08-01 DIAGNOSIS — IMO0002 Reserved for concepts with insufficient information to code with codable children: Secondary | ICD-10-CM | POA: Insufficient documentation

## 2013-08-01 DIAGNOSIS — Z7901 Long term (current) use of anticoagulants: Secondary | ICD-10-CM | POA: Insufficient documentation

## 2013-08-01 DIAGNOSIS — Z86711 Personal history of pulmonary embolism: Secondary | ICD-10-CM | POA: Insufficient documentation

## 2013-08-01 DIAGNOSIS — Z86718 Personal history of other venous thrombosis and embolism: Secondary | ICD-10-CM | POA: Insufficient documentation

## 2013-08-01 HISTORY — PX: ORIF HUMERUS FRACTURE: SHX2126

## 2013-08-01 LAB — PROTIME-INR
INR: 1.03 (ref 0.00–1.49)
Prothrombin Time: 13.3 seconds (ref 11.6–15.2)

## 2013-08-01 LAB — HEPATIC FUNCTION PANEL
ALT: 9 U/L (ref 0–35)
Bilirubin, Direct: <0.1 mg/dL (ref 0.0–0.3)
Total Protein: 5.9 g/dL — ABNORMAL LOW (ref 6.0–8.3)

## 2013-08-01 LAB — APTT: aPTT: 27 seconds (ref 24–37)

## 2013-08-01 SURGERY — OPEN REDUCTION INTERNAL FIXATION (ORIF) PROXIMAL HUMERUS FRACTURE
Anesthesia: General | Site: Arm Upper | Laterality: Right | Wound class: Clean

## 2013-08-01 MED ORDER — HYDROMORPHONE HCL PF 1 MG/ML IJ SOLN
INTRAMUSCULAR | Status: AC
  Filled 2013-08-01: qty 1

## 2013-08-01 MED ORDER — FENTANYL CITRATE 0.05 MG/ML IJ SOLN
INTRAMUSCULAR | Status: DC | PRN
  Administered 2013-08-01 (×2): 50 ug via INTRAVENOUS
  Administered 2013-08-01: 25 ug via INTRAVENOUS
  Administered 2013-08-01: 75 ug via INTRAVENOUS
  Administered 2013-08-01: 50 ug via INTRAVENOUS

## 2013-08-01 MED ORDER — OXYCODONE HCL 5 MG/5ML PO SOLN: 5.0000 mg | Freq: Once | ORAL | Status: AC | PRN

## 2013-08-01 MED ORDER — PROPOFOL 10 MG/ML IV BOLUS
INTRAVENOUS | Status: DC | PRN
  Administered 2013-08-01: 120 mg via INTRAVENOUS

## 2013-08-01 MED ORDER — PROMETHAZINE HCL 25 MG/ML IJ SOLN: 6.2500 mg | INTRAMUSCULAR | Status: DC | PRN

## 2013-08-01 MED ORDER — ROCURONIUM BROMIDE 100 MG/10ML IV SOLN
INTRAVENOUS | Status: DC | PRN
  Administered 2013-08-01 (×2): 10 mg via INTRAVENOUS
  Administered 2013-08-01: 40 mg via INTRAVENOUS
  Administered 2013-08-01: 20 mg via INTRAVENOUS
  Administered 2013-08-01: 10 mg via INTRAVENOUS

## 2013-08-01 MED ORDER — LIDOCAINE HCL (CARDIAC) 20 MG/ML IV SOLN
INTRAVENOUS | Status: DC | PRN
  Administered 2013-08-01: 60 mg via INTRAVENOUS

## 2013-08-01 MED ORDER — DEXAMETHASONE SODIUM PHOSPHATE 10 MG/ML IJ SOLN
INTRAMUSCULAR | Status: DC | PRN
  Administered 2013-08-01: 8 mg via INTRAVENOUS

## 2013-08-01 MED ORDER — OXYCODONE HCL 5 MG PO TABS
5.0000 mg | ORAL_TABLET | Freq: Once | ORAL | Status: AC | PRN
  Administered 2013-08-01: 5 mg via ORAL

## 2013-08-01 MED ORDER — PHENYLEPHRINE HCL 10 MG/ML IJ SOLN
INTRAMUSCULAR | Status: DC | PRN
  Administered 2013-08-01 (×2): 40 ug via INTRAVENOUS

## 2013-08-01 MED ORDER — MEPERIDINE HCL 25 MG/ML IJ SOLN: 6.2500 mg | INTRAMUSCULAR | Status: DC | PRN

## 2013-08-01 MED ORDER — NEOSTIGMINE METHYLSULFATE 1 MG/ML IJ SOLN
INTRAMUSCULAR | Status: DC | PRN
  Administered 2013-08-01: 3 mg via INTRAVENOUS

## 2013-08-01 MED ORDER — OXYCODONE HCL 5 MG PO TABS
ORAL_TABLET | ORAL | Status: AC
  Filled 2013-08-01: qty 1

## 2013-08-01 MED ORDER — 0.9 % SODIUM CHLORIDE (POUR BTL) OPTIME
TOPICAL | Status: DC | PRN
  Administered 2013-08-01: 1000 mL

## 2013-08-01 MED ORDER — MIDAZOLAM HCL 5 MG/5ML IJ SOLN
INTRAMUSCULAR | Status: DC | PRN
  Administered 2013-08-01: 2 mg via INTRAVENOUS

## 2013-08-01 MED ORDER — GLYCOPYRROLATE 0.2 MG/ML IJ SOLN
INTRAMUSCULAR | Status: DC | PRN
  Administered 2013-08-01: 0.4 mg via INTRAVENOUS

## 2013-08-01 MED ORDER — LACTATED RINGERS IV SOLN
INTRAVENOUS | Status: DC | PRN
  Administered 2013-08-01: 07:00:00 via INTRAVENOUS

## 2013-08-01 MED ORDER — ROPIVACAINE HCL 5 MG/ML IJ SOLN
INTRAMUSCULAR | Status: DC | PRN
  Administered 2013-08-01: 30 mL

## 2013-08-01 MED ORDER — ONDANSETRON HCL 4 MG/2ML IJ SOLN
INTRAMUSCULAR | Status: DC | PRN
  Administered 2013-08-01: 4 mg via INTRAVENOUS

## 2013-08-01 MED ORDER — HYDROMORPHONE HCL PF 1 MG/ML IJ SOLN
0.2500 mg | INTRAMUSCULAR | Status: DC | PRN
  Administered 2013-08-01 (×2): 0.5 mg via INTRAVENOUS

## 2013-08-01 SURGICAL SUPPLY — 91 items
BANDAGE GAUZE ELAST BULKY 4 IN (GAUZE/BANDAGES/DRESSINGS) IMPLANT
BENZOIN TINCTURE PRP APPL 2/3 (GAUZE/BANDAGES/DRESSINGS) IMPLANT
BIT DRILL 2.5X110 QC LCP DISP (BIT) ×2 IMPLANT
BIT DRILL 2.8 (BIT) ×1
BIT DRILL CANN QC 2.8X165 (BIT) ×1 IMPLANT
BNDG COHESIVE 4X5 TAN STRL (GAUZE/BANDAGES/DRESSINGS) ×2 IMPLANT
BONE CANC CHIPS 40CC CAN1/2 (Bone Implant) ×2 IMPLANT
BRUSH SCRUB DISP (MISCELLANEOUS) ×4 IMPLANT
CANISTER SUCTION 2500CC (MISCELLANEOUS) ×2 IMPLANT
CATH ROBINSON RED A/P 14FR (CATHETERS) ×2 IMPLANT
CHIPS CANC BONE 40CC CAN1/2 (Bone Implant) ×1 IMPLANT
CLOTH BEACON ORANGE TIMEOUT ST (SAFETY) ×2 IMPLANT
COVER SURGICAL LIGHT HANDLE (MISCELLANEOUS) ×2 IMPLANT
DRAPE C-ARM 42X72 X-RAY (DRAPES) ×2 IMPLANT
DRAPE C-ARMOR (DRAPES) ×2 IMPLANT
DRAPE INCISE IOBAN 66X45 STRL (DRAPES) ×2 IMPLANT
DRAPE ORTHO SPLIT 77X108 STRL (DRAPES) ×2
DRAPE SURG 17X11 SM STRL (DRAPES) ×4 IMPLANT
DRAPE SURG ORHT 6 SPLT 77X108 (DRAPES) ×2 IMPLANT
DRAPE U-SHAPE 47X51 STRL (DRAPES) ×4 IMPLANT
DRILL BIT 2.8MM (BIT) ×1
DRSG ADAPTIC 3X8 NADH LF (GAUZE/BANDAGES/DRESSINGS) ×2 IMPLANT
DRSG PAD ABDOMINAL 8X10 ST (GAUZE/BANDAGES/DRESSINGS) ×2 IMPLANT
ELECT REM PT RETURN 9FT ADLT (ELECTROSURGICAL) ×2
ELECTRODE REM PT RTRN 9FT ADLT (ELECTROSURGICAL) ×1 IMPLANT
EVACUATOR 1/8 PVC DRAIN (DRAIN) IMPLANT
FACESHIELD STD STERILE (MASK) ×2 IMPLANT
GEL ULTRASOUND 20GR AQUASONIC (MISCELLANEOUS) ×2 IMPLANT
GLOVE BIO SURGEON STRL SZ7.5 (GLOVE) ×4 IMPLANT
GLOVE BIO SURGEON STRL SZ8 (GLOVE) ×4 IMPLANT
GLOVE BIOGEL PI IND STRL 6.5 (GLOVE) ×4 IMPLANT
GLOVE BIOGEL PI IND STRL 7.0 (GLOVE) ×1 IMPLANT
GLOVE BIOGEL PI IND STRL 7.5 (GLOVE) ×1 IMPLANT
GLOVE BIOGEL PI IND STRL 8 (GLOVE) ×1 IMPLANT
GLOVE BIOGEL PI INDICATOR 6.5 (GLOVE) ×4
GLOVE BIOGEL PI INDICATOR 7.0 (GLOVE) ×1
GLOVE BIOGEL PI INDICATOR 7.5 (GLOVE) ×1
GLOVE BIOGEL PI INDICATOR 8 (GLOVE) ×1
GLOVE ECLIPSE 6.5 STRL STRAW (GLOVE) ×10 IMPLANT
GOWN PREVENTION PLUS XLARGE (GOWN DISPOSABLE) ×2 IMPLANT
GOWN PREVENTION PLUS XXLARGE (GOWN DISPOSABLE) IMPLANT
GOWN STRL NON-REIN LRG LVL3 (GOWN DISPOSABLE) ×10 IMPLANT
GUIDEWIRE THREADED 150MM (WIRE) ×8 IMPLANT
KIT BASIN OR (CUSTOM PROCEDURE TRAY) ×2 IMPLANT
KIT INFUSE LRG II (Orthopedic Implant) ×2 IMPLANT
KIT ROOM TURNOVER OR (KITS) ×2 IMPLANT
LOOP VESSEL MAXI BLUE (MISCELLANEOUS) IMPLANT
MANIFOLD NEPTUNE II (INSTRUMENTS) IMPLANT
NS IRRIG 1000ML POUR BTL (IV SOLUTION) ×2 IMPLANT
PACK TOTAL JOINT (CUSTOM PROCEDURE TRAY) ×2 IMPLANT
PAD ARMBOARD 7.5X6 YLW CONV (MISCELLANEOUS) ×4 IMPLANT
PLATE LCP 3.5 PROX HUM 3HX90 (Plate) ×2 IMPLANT
SCREW CORTEX 3.5 24MM (Screw) ×1 IMPLANT
SCREW CORTEX 3.5 26MM (Screw) ×1 IMPLANT
SCREW CORTEX 3.5 30MM (Screw) ×1 IMPLANT
SCREW LOCK CORT ST 3.5X24 (Screw) ×1 IMPLANT
SCREW LOCK CORT ST 3.5X26 (Screw) ×1 IMPLANT
SCREW LOCK CORT ST 3.5X30 (Screw) ×1 IMPLANT
SCREW LOCK T15 FT 24X3.5X2.9X (Screw) ×1 IMPLANT
SCREW LOCK T15 FT 40X3.5XST (Screw) ×3 IMPLANT
SCREW LOCKING 3.5X24 (Screw) ×1 IMPLANT
SCREW LOCKING 3.5X40 (Screw) ×3 IMPLANT
SCREW LOCKING 3.5X45 (Screw) ×6 IMPLANT
SCREW LOCKING 3.5X50 (Screw) ×4 IMPLANT
SLING ARM FOAM STRAP MED (SOFTGOODS) ×2 IMPLANT
SPONGE GAUZE 4X4 12PLY (GAUZE/BANDAGES/DRESSINGS) ×2 IMPLANT
SPONGE LAP 18X18 X RAY DECT (DISPOSABLE) IMPLANT
STAPLER VISISTAT 35W (STAPLE) IMPLANT
STOCKINETTE IMPERVIOUS 9X36 MD (GAUZE/BANDAGES/DRESSINGS) ×2 IMPLANT
STOCKINETTE IMPERVIOUS LG (DRAPES) IMPLANT
STRIP CLOSURE SKIN 1/2X4 (GAUZE/BANDAGES/DRESSINGS) ×2 IMPLANT
SUCTION FRAZIER TIP 10 FR DISP (SUCTIONS) ×2 IMPLANT
SUT ETHIBOND 5 LR DA (SUTURE) IMPLANT
SUT FIBERWIRE #2 38 T-5 BLUE (SUTURE)
SUT PDS AB 2-0 CT1 27 (SUTURE) IMPLANT
SUT PROLENE 3 0 PS 2 (SUTURE) ×2 IMPLANT
SUT PROLENE 6 0 C 1 30 (SUTURE) ×2 IMPLANT
SUT SILK 3 0 (SUTURE) ×1
SUT SILK 3-0 18XBRD TIE 12 (SUTURE) ×1 IMPLANT
SUT VIC AB 0 CT1 27 (SUTURE) ×1
SUT VIC AB 0 CT1 27XBRD ANBCTR (SUTURE) ×1 IMPLANT
SUT VIC AB 2-0 CT1 27 (SUTURE) ×1
SUT VIC AB 2-0 CT1 TAPERPNT 27 (SUTURE) ×1 IMPLANT
SUT VIC AB 2-0 CT3 27 (SUTURE) IMPLANT
SUTURE FIBERWR #2 38 T-5 BLUE (SUTURE) IMPLANT
SYR 5ML LL (SYRINGE) IMPLANT
TOWEL OR 17X24 6PK STRL BLUE (TOWEL DISPOSABLE) ×2 IMPLANT
TOWEL OR 17X26 10 PK STRL BLUE (TOWEL DISPOSABLE) ×4 IMPLANT
TRAY FOLEY CATH 16FRSI W/METER (SET/KITS/TRAYS/PACK) IMPLANT
WATER STERILE IRR 1000ML POUR (IV SOLUTION) IMPLANT
YANKAUER SUCT BULB TIP NO VENT (SUCTIONS) ×2 IMPLANT

## 2013-08-01 NOTE — Anesthesia Procedure Notes (Addendum)
Anesthesia Regional Block:  Supraclavicular block  Pre-Anesthetic Checklist: ,, timeout performed, Correct Patient, Correct Site, Correct Laterality, Correct Procedure, Correct Position, site marked, Risks and benefits discussed,  Surgical consent,  Pre-op evaluation,  At surgeon's request and post-op pain management  Laterality: Right  Prep: chloraprep       Needles:  Injection technique: Single-shot  Needle Type: Stimiplex     Needle Length: 10cm 10 cm     Additional Needles:  Procedures: ultrasound guided (picture in chart)  Motor weakness within 5 minutes. Supraclavicular block Narrative:  Start time: 08/01/2013 7:45 AM End time: 08/01/2013 7:55 AM Injection made incrementally with aspirations every 5 mL.  Performed by: Personally  Anesthesiologist: [place]  Additional Notes: Risks, benefits and alternative to block explained extensively.  Patient tolerated procedure well, without complications. U/S picture in paper chart.  Supraclavicular block Procedure Name: Intubation Date/Time: 08/01/2013 8:31 AM Performed by: Ellin Goodie Pre-anesthesia Checklist: Patient identified, Emergency Drugs available, Suction available, Patient being monitored and Timeout performed Patient Re-evaluated:Patient Re-evaluated prior to inductionOxygen Delivery Method: Circle system utilized Preoxygenation: Pre-oxygenation with 100% oxygen Intubation Type: IV induction Ventilation: Mask ventilation without difficulty Laryngoscope Size: Mac and 3 Grade View: Grade II Tube type: Oral Tube size: 7.5 mm Number of attempts: 1 Airway Equipment and Method: Stylet Placement Confirmation: ETT inserted through vocal cords under direct vision,  positive ETCO2 and breath sounds checked- equal and bilateral Secured at: 23 cm Tube secured with: Tape Dental Injury: Teeth and Oropharynx as per pre-operative assessment  Comments: Easy atraumatic induction and intubation with MAC 3 blade.  Dr.  Renold Don verified placement of ETT.  Carlynn Herald, CRNA

## 2013-08-01 NOTE — Progress Notes (Signed)
Report to Maria T. RN as primary caregiver. 

## 2013-08-01 NOTE — Brief Op Note (Signed)
08/01/2013  12:13 PM  PATIENT:  Evelyn [name]  67 y.o. female  PRE-OPERATIVE DIAGNOSIS:  Nonunion right proximal humerus  POST-OPERATIVE DIAGNOSIS:  Nonunion right proximal humerus  PROCEDURE:  Procedure(s): NON-UNION REPAIR RIGHT PROXIMAL HUMERUS FRACTURE (Right) with Infuse and allograft  SURGEON:  Surgeon(s) and Role:    * Budd Palmer, MD - Primary  ANESTHESIA:   general plus regional block  EBL:  Total I/O In: 1300 [I.V.:1300] Out: 1200 [Urine:1000; Blood:200]  BLOOD ADMINISTERED:none  DRAINS: none   LOCAL MEDICATIONS USED:  NONE  SPECIMEN:  No Specimen  DISPOSITION OF SPECIMEN:  N/A  COUNTS:  YES  TOURNIQUET:  * No tourniquets in log *  DICTATION: .Other Dictation: Dictation Number [id number]  PLAN OF CARE: Discharge to home after PACU  PATIENT DISPOSITION:  PACU - hemodynamically stable.   Delay start of Pharmacological VTE agent (>24hrs) due to surgical blood loss or risk of bleeding: no

## 2013-08-01 NOTE — Preoperative (Signed)
Beta Blockers   Reason not to administer Beta Blockers:Not Applicable 

## 2013-08-01 NOTE — Transfer of Care (Signed)
Immediate Anesthesia Transfer of Care Note  Patient: Evelyn [name]  Procedure(s) Performed: Procedure(s): NON-UNION REPAIR RIGHT PROXIMAL HUMERUS FRACTURE (Right)  Patient Location: PACU  Anesthesia Type:General  Level of Consciousness: awake, alert  and oriented  Airway & Oxygen Therapy: Patient connected to face mask oxygen  Post-op Assessment: Report given to PACU RN  Post vital signs: stable  Complications: No apparent anesthesia complications

## 2013-08-01 NOTE — Progress Notes (Signed)
Nurse called Dr. Carola Frost in regards to recent lab work obtained. Patient had a CBC and a CBC with Diff was ordered after recent Pre-Admit visit. Patient also had a BMET and a CMET was ordered. Dr. Carola Frost stated that a CBC was fine and it would be ok to add hepatic liver function lab instead of obtaining a entire CMET. Evelyn [name] (Research scientist (medical)) notified of this.

## 2013-08-01 NOTE — Progress Notes (Signed)
Report given to Gean Quint, RN assuming care of patient. Patient c/o right upper lip swollen and feels like it got pinched

## 2013-08-01 NOTE — H&P (Signed)
Evelyn [name] is an 67 y.o. female.   Chief Complaint: R prox humerus nonunion HPI: 67 yo RHD female with persistent weakness of R sh and CT scan that demonstrates olgigotrophic nonunion of surgical neck now 6 months out.  D/w patient and her husband risks and benefits of surgical repair and grafting options, autograft, allograft, and Infuse. The patient and her husband recognized those to include the possibility of infection, nerve injury, vessel injury, persistent nonunion, wound breakdown, arthritis, symptomatic hardware, DVT/ PE, (particularlly given her history), loss of motion, and need for further surgery among others.  They understood these risks and wished to proceed.   Past Medical History  Diagnosis Date  . Seizures   . 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 PE (pulmonary embolism) 2005  . History of echocardiogram 03/11/09    nl EF 60-65%  . Peripheral vascular disease     dvt leg rt  . History of recurrent UTIs     feels like may have one now  . ZOXWRUEA(540.9)     Past Surgical History  Procedure Laterality Date  . Knee surgery Left 96    kneecap fx  . Insertion of vena cava filter  10    Family History  Problem Relation Age of Onset  . Diabetes Mother   . Stroke Father    Social History:  reports that she has never smoked. She does not have any smokeless tobacco history on file. She reports that she does not drink alcohol or use illicit drugs.  Allergies:  Allergies  Allergen Reactions  . Tegretol [Carbamazepine] Rash    Medications Prior to Admission  Medication Sig Dispense Refill  . Calcium Citrate-Vitamin D (CALCIUM CITRATE + D PO) Take 1 tablet by mouth 2 (two) times daily with a meal.      . cetirizine (ZYRTEC) 10 MG tablet Take 10 mg by mouth daily.      . Coenzyme Q10 50 MG CAPS Take 1 capsule by mouth every  morning.      Marland Kitchen doxylamine, Sleep, (UNISOM) 25 MG tablet Take 25 mg by mouth at bedtime.       . enoxaparin (LOVENOX) 60 MG/0.6ML injection Inject 0.6 mLs (60 mg total) into the skin every 12 (twelve) hours.  16 Syringe  0  . Glucosamine-Chondroitin (GLUCOSAMINE CHONDR COMPLEX PO) Take 1 tablet by mouth 2 (two) times daily with a meal.      . lamoTRIgine (LAMICTAL) 100 MG tablet Take 50 mg by mouth 2 (two) times daily. Takes with 200mg  tablets for a total dose of 250mg  twice a day      . lamoTRIgine (LAMICTAL) 200 MG tablet Take 200 mg by mouth 2 (two) times daily. Takes with 50mg  tablets for a total dose of 250mg  twice a day      . Multiple Vitamin (MULTIVITAMIN WITH MINERALS) TABS tablet Take 1 tablet by mouth 2 (two) times daily with a meal.      . Omega-3 Fatty Acids (OMEGA 3 PO) Take 1 capsule by mouth every morning.      Marland Kitchen OVER THE COUNTER MEDICATION Take 1 tablet by mouth every morning. Hair Volume.      Marland Kitchen OVER THE COUNTER MEDICATION Place 1 spray into the nose as needed (dry/bloody nose). Equate Saline Nasal Spray.      Marland Kitchen oxyCODONE-acetaminophen (  PERCOCET/ROXICET) 5-325 MG per tablet Take 1 tablet by mouth every 8 (eight) hours as needed for pain.      . primidone (MYSOLINE) 250 MG tablet Take 125-250 mg by mouth 3 (three) times daily. Take one tablet (250 mg) at 10:00am; Take one half-tablet (125 mg) at 4:00 pm; Take one tablet (250 mg) at 10:00 pm.      . topiramate (TOPAMAX) 25 MG tablet Take 50-75 mg by mouth 2 (two) times daily. 3 tablets in the morning; 2 tablets in the evening      . warfarin (COUMADIN) 7.5 MG tablet Take 11.25-15 mg by mouth daily. Take 2 tablets (15 mg) on Monday and Friday. Take 1 and one-half (11.25 mg) on Sunday, Tuesday, Wednesday, Thursday, Saturday.      Marland Kitchen HYDROcodone-acetaminophen (VICODIN) 5-500 MG per tablet Take 1 tablet by mouth every 4 (four) hours as needed for pain (headaches).        Results for orders placed during the hospital encounter of 08/01/13  (from the past 48 hour(s))  APTT     Status: None   Collection Time    08/01/13  7:12 AM      Result Value Range   aPTT 27  24 - 37 seconds  PROTIME-INR     Status: None   Collection Time    08/01/13  7:12 AM      Result Value Range   Prothrombin Time 13.3  11.6 - 15.2 seconds   INR 1.03  0.00 - 1.49  HEPATIC FUNCTION PANEL     Status: Abnormal   Collection Time    08/01/13  7:12 AM      Result Value Range   Total Protein 5.9 (*) 6.0 - 8.3 g/dL   Albumin 3.0 (*) 3.5 - 5.2 g/dL   AST 27  0 - 37 U/L   ALT 9  0 - 35 U/L   Alkaline Phosphatase 102  39 - 117 U/L   Total Bilirubin 0.1 (*) 0.3 - 1.2 mg/dL   Bilirubin, Direct <1.6  0.0 - 0.3 mg/dL   Indirect Bilirubin NOT CALCULATED  0.3 - 0.9 mg/dL   No results found.  ROS as noted above.  Blood pressure 116/56, pulse 69, temperature 97.6 F (36.4 C), temperature source Oral, resp. rate 20, SpO2 100.00%. A&O, very pleasant RRR CTA S/ NT/ ND RUEx  shoulder outstanding PROM  elbow, wrist, digits- no skin wounds, nontender, no instability, no blocks to motion  Sens  Ax/R/M/U intact  Mot   Ax/ R/ PIN/ M/ AIN/ U intact  Rad 2+    Assessment/Plan R prox humerus nonunion for surgical repair with plating and Infuse allografting Excellent block preop so anticipating outpatient depending on post-op evaluation.  Myrene Galas, MD [facility] [phone] (p)   08/01/2013, 8:17 AM

## 2013-08-01 NOTE — Anesthesia Preprocedure Evaluation (Addendum)
Anesthesia Evaluation  Patient identified by MRN, date of birth, ID band Patient awake    Reviewed: Allergy & Precautions, H&P , NPO status , Patient's Chart, lab work & pertinent test results, reviewed documented beta blocker date and time   Airway Mallampati: II TM Distance: >3 FB     Dental  (+) Dental Advisory Given and Teeth Intact   Pulmonary neg pulmonary ROS,  breath sounds clear to auscultation        Cardiovascular DVT - Peripheral Vascular Disease negative cardio ROS  Rhythm:Regular  Echo 2010  Overall left ventricular systolic function was normal. Left ventricular ejection fraction was estimated, range being 60% to 65 %. There were no left ventricular regional wall motion abnormalities. Left ventricular diastolic function parameters were normal.    Neuro/Psych  Headaches, Seizures -, Well Controlled,  negative psych ROS   GI/Hepatic negative GI ROS, Neg liver ROS,   Endo/Other  negative endocrine ROS  Renal/GU negative Renal ROS     Musculoskeletal negative musculoskeletal ROS (+)   Abdominal (+)  Abdomen: soft. Bowel sounds: normal.  Peds  Hematology negative hematology ROS (+)   Anesthesia Other Findings Pt has IVC filter and takes coumadin last dose approx 4 days ago.    Reproductive/Obstetrics negative OB ROS                      Anesthesia Physical Anesthesia Plan  ASA: II  Anesthesia Plan: General   Post-op Pain Management:    Induction: Intravenous  Airway Management Planned:   Additional Equipment:   Intra-op Plan:   Post-operative Plan:   Informed Consent: I have reviewed the patients History and Physical, chart, labs and discussed the procedure including the risks, benefits and alternatives for the proposed anesthesia with the patient or authorized representative who has indicated his/her understanding and acceptance.   Dental advisory given  Plan Discussed  with: CRNA  Anesthesia Plan Comments:         Anesthesia Quick Evaluation

## 2013-08-01 NOTE — Anesthesia Postprocedure Evaluation (Signed)
Anesthesia Post Note  Patient: Evelyn [name]  Procedure(s) Performed: Procedure(s) (LRB): NON-UNION REPAIR RIGHT PROXIMAL HUMERUS FRACTURE (Right)  Anesthesia type: general  Patient location: PACU  Post pain: Pain level controlled  Post assessment: Patient's Cardiovascular Status Stable  Last Vitals:  Filed Vitals:   08/01/13 1424  BP: 104/52  Pulse:   Temp: 36.7 C  Resp:     Post vital signs: Reviewed and stable  Level of consciousness: sedated  Complications: No apparent anesthesia complications

## 2013-08-02 ENCOUNTER — Encounter (HOSPITAL_COMMUNITY): Payer: Self-pay

## 2013-08-02 ENCOUNTER — Emergency Department (HOSPITAL_COMMUNITY): Payer: BC Managed Care – PPO

## 2013-08-02 ENCOUNTER — Emergency Department (HOSPITAL_COMMUNITY)
Admission: EM | Admit: 2013-08-02 | Discharge: 2013-08-02 | Disposition: A | Discharge status: Home / Self Care | Payer: BC Managed Care – PPO | Attending: Emergency Medicine | Admitting: Emergency Medicine

## 2013-08-02 DIAGNOSIS — IMO0002 Reserved for concepts with insufficient information to code with codable children: Secondary | ICD-10-CM | POA: Insufficient documentation

## 2013-08-02 DIAGNOSIS — Z7901 Long term (current) use of anticoagulants: Secondary | ICD-10-CM | POA: Insufficient documentation

## 2013-08-02 DIAGNOSIS — Y9301 Activity, walking, marching and hiking: Secondary | ICD-10-CM | POA: Insufficient documentation

## 2013-08-02 DIAGNOSIS — Z87440 Personal history of urinary (tract) infections: Secondary | ICD-10-CM | POA: Insufficient documentation

## 2013-08-02 DIAGNOSIS — Z79899 Other long term (current) drug therapy: Secondary | ICD-10-CM | POA: Insufficient documentation

## 2013-08-02 DIAGNOSIS — Z8679 Personal history of other diseases of the circulatory system: Secondary | ICD-10-CM | POA: Insufficient documentation

## 2013-08-02 DIAGNOSIS — G40909 Epilepsy, unspecified, not intractable, without status epilepticus: Secondary | ICD-10-CM | POA: Insufficient documentation

## 2013-08-02 DIAGNOSIS — S41009A Unspecified open wound of unspecified shoulder, initial encounter: Secondary | ICD-10-CM | POA: Insufficient documentation

## 2013-08-02 DIAGNOSIS — S4980XA Other specified injuries of shoulder and upper arm, unspecified arm, initial encounter: Secondary | ICD-10-CM | POA: Insufficient documentation

## 2013-08-02 DIAGNOSIS — Z86718 Personal history of other venous thrombosis and embolism: Secondary | ICD-10-CM | POA: Insufficient documentation

## 2013-08-02 DIAGNOSIS — Z86711 Personal history of pulmonary embolism: Secondary | ICD-10-CM | POA: Insufficient documentation

## 2013-08-02 DIAGNOSIS — W1809XA Striking against other object with subsequent fall, initial encounter: Secondary | ICD-10-CM | POA: Insufficient documentation

## 2013-08-02 DIAGNOSIS — Y9289 Other specified places as the place of occurrence of the external cause: Secondary | ICD-10-CM | POA: Insufficient documentation

## 2013-08-02 DIAGNOSIS — S46909A Unspecified injury of unspecified muscle, fascia and tendon at shoulder and upper arm level, unspecified arm, initial encounter: Secondary | ICD-10-CM | POA: Insufficient documentation

## 2013-08-02 DIAGNOSIS — R55 Syncope and collapse: Secondary | ICD-10-CM | POA: Insufficient documentation

## 2013-08-02 LAB — URINALYSIS, ROUTINE W REFLEX MICROSCOPIC
Hgb urine dipstick: NEGATIVE
Specific Gravity, Urine: 1.012 (ref 1.005–1.030)
Urobilinogen, UA: 0.2 mg/dL (ref 0.0–1.0)
pH: 6.5 (ref 5.0–8.0)

## 2013-08-02 LAB — CBC WITH DIFFERENTIAL/PLATELET
Eosinophils Absolute: 0 10*3/uL (ref 0.0–0.7)
Hemoglobin: 11.1 g/dL — ABNORMAL LOW (ref 12.0–15.0)
Lymphocytes Relative: 11 % — ABNORMAL LOW (ref 12–46)
Lymphs Abs: 1.2 10*3/uL (ref 0.7–4.0)
Monocytes Relative: 10 % (ref 3–12)
Neutro Abs: 8.7 10*3/uL — ABNORMAL HIGH (ref 1.7–7.7)
Neutrophils Relative %: 79 % — ABNORMAL HIGH (ref 43–77)
Platelets: 181 10*3/uL (ref 150–400)
RBC: 3.72 MIL/uL — ABNORMAL LOW (ref 3.87–5.11)
WBC: 11.1 10*3/uL — ABNORMAL HIGH (ref 4.0–10.5)

## 2013-08-02 LAB — PROTIME-INR
INR: 1.43 (ref 0.00–1.49)
Prothrombin Time: 17.1 seconds — ABNORMAL HIGH (ref 11.6–15.2)

## 2013-08-02 LAB — BASIC METABOLIC PANEL
BUN: 14 mg/dL (ref 6–23)
CO2: 20 mEq/L (ref 19–32)
Chloride: 103 mEq/L (ref 96–112)
GFR calc non Af Amer: >90 mL/min (ref >90)
Glucose, Bld: 111 mg/dL — ABNORMAL HIGH (ref 70–99)
Potassium: 3.6 mEq/L (ref 3.5–5.1)
Sodium: 136 mEq/L (ref 135–145)

## 2013-08-02 LAB — URINE MICROSCOPIC-ADD ON

## 2013-08-02 LAB — D-DIMER, QUANTITATIVE: D-Dimer, Quant: <0.27 ug/mL-FEU (ref 0.00–0.48)

## 2013-08-02 MED ORDER — MORPHINE SULFATE 4 MG/ML IJ SOLN
4.0000 mg | Freq: Once | INTRAMUSCULAR | Status: DC
Start: 2013-08-02 — End: 2013-08-02
  Filled 2013-08-02: qty 1

## 2013-08-02 MED ORDER — OXYCODONE-ACETAMINOPHEN 5-325 MG PO TABS
1.0000 | ORAL_TABLET | Freq: Once | ORAL | Status: AC
  Administered 2013-08-02: 1 via ORAL
  Filled 2013-08-02: qty 1

## 2013-08-02 MED ORDER — SODIUM CHLORIDE 0.9 % IV BOLUS (SEPSIS)
1000.0000 mL | Freq: Once | INTRAVENOUS | Status: AC
  Administered 2013-08-02: 1000 mL via INTRAVENOUS

## 2013-08-02 NOTE — ED Provider Notes (Signed)
CSN: [id number]     Arrival date & time 08/02/13  0503 History     First MD Initiated Contact with Patient 08/02/13 0518     Chief Complaint  Patient presents with  . Fall   (Consider location/radiation/quality/duration/timing/severity/associated sxs/prior Treatment) HPI Comments: 67 year old female presenting after her syncope. She woke up in the middle the night and went to the bathroom. After using the bathroom she was walking back to her bedroom and she suddenly became lightheaded. She then fell to the floor unconscious. She struck the back of her head. She complains of back pain (which was present before fall) and shoulder pain.    Patient is a 67 y.o. female presenting with syncope.  Loss of Consciousness Episode history:  Single Most recent episode:  Today Duration:  1 minute Timing:  Constant Progression:  Resolved Chronicity:  New Context comment:  Right shoulder surgery yesterday Witnessed: yes   Relieved by:  Nothing Worsened by:  Nothing tried Associated symptoms: dizziness   Associated symptoms: no chest pain, no difficulty breathing, no fever, no nausea, no shortness of breath and no vomiting     Past Medical History  Diagnosis Date  . Seizures   . 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 PE (pulmonary embolism) 2005  . History of echocardiogram 03/11/09    nl EF 60-65%  . Peripheral vascular disease     dvt leg rt  . History of recurrent UTIs     feels like may have one now  . OZDGUYQI(347.4)    Past Surgical History  Procedure Laterality Date  . Knee surgery Left 96    kneecap fx  . Insertion of vena cava filter  10   Family History  Problem Relation Age of Onset  . Diabetes Mother   . Stroke Father    History  Substance Use Topics  . Smoking status: Never Smoker   . Smokeless tobacco: Not on file  . Alcohol  Use: No   OB History   Grav Para Term Preterm Abortions TAB SAB Ect Mult Living                 Review of Systems  Constitutional: Negative for fever.  HENT: Negative for congestion.   Respiratory: Negative for cough and shortness of breath.   Cardiovascular: Positive for syncope. Negative for chest pain.  Gastrointestinal: Negative for nausea, vomiting, abdominal pain and diarrhea.  Neurological: Positive for dizziness.  All other systems reviewed and are negative.    Allergies  Tegretol  Home Medications   Current Outpatient Rx  Name  Route  Sig  Dispense  Refill  . Calcium Citrate-Vitamin D (CALCIUM CITRATE + D PO)   Oral   Take 1 tablet by mouth 2 (two) times daily with a meal.         . cetirizine (ZYRTEC) 10 MG tablet   Oral   Take 10 mg by mouth daily.         . Coenzyme Q10 50 MG CAPS   Oral   Take 1 capsule by mouth every morning.         Marland Kitchen doxylamine, Sleep, (UNISOM) 25 MG tablet   Oral   Take 25 mg by mouth at bedtime.          . Glucosamine-Chondroitin (GLUCOSAMINE CHONDR COMPLEX PO)  Oral   Take 1 tablet by mouth 2 (two) times daily with a meal.         . lamoTRIgine (LAMICTAL) 100 MG tablet   Oral   Take 50 mg by mouth 2 (two) times daily. Takes with 200mg  tablets for a total dose of 250mg  twice a day         . lamoTRIgine (LAMICTAL) 200 MG tablet   Oral   Take 200 mg by mouth 2 (two) times daily. Takes with 50mg  tablets for a total dose of 250mg  twice a day         . Multiple Vitamin (MULTIVITAMIN WITH MINERALS) TABS tablet   Oral   Take 1 tablet by mouth 2 (two) times daily with a meal.         . Omega-3 Fatty Acids (OMEGA 3 PO)   Oral   Take 1 capsule by mouth every morning.         Marland Kitchen OVER THE COUNTER MEDICATION   Oral   Take 1 tablet by mouth every morning. Hair Volume.         Marland Kitchen OVER THE COUNTER MEDICATION   Nasal   Place 1 spray into the nose as needed (dry/bloody nose). Equate Saline Nasal Spray.          Marland Kitchen oxyCODONE-acetaminophen (PERCOCET/ROXICET) 5-325 MG per tablet   Oral   Take 1 tablet by mouth every 8 (eight) hours as needed for pain.         . primidone (MYSOLINE) 250 MG tablet   Oral   Take 125-250 mg by mouth 3 (three) times daily. Take one tablet (250 mg) at 10:00am; Take one half-tablet (125 mg) at 4:00 pm; Take one tablet (250 mg) at 10:00 pm.         . topiramate (TOPAMAX) 25 MG tablet   Oral   Take 50-75 mg by mouth 2 (two) times daily. 3 tablets in the morning; 2 tablets in the evening         . warfarin (COUMADIN) 7.5 MG tablet   Oral   Take 15 mg by mouth daily. For 4 days then have INR checked         . enoxaparin (LOVENOX) 60 MG/0.6ML injection   Subcutaneous   Inject 0.6 mLs (60 mg total) into the skin every 12 (twelve) hours.   16 Syringe   0    BP 100/42  Pulse 80  Temp(Src) 98.7 F (37.1 C) (Oral)  Resp 20  SpO2 97% Physical Exam  Nursing note and vitals reviewed. Constitutional: She is oriented to person, place, and time. She appears well-developed and well-nourished. No distress.  HENT:  Head: Normocephalic and atraumatic.  Mouth/Throat: Oropharynx is clear and moist.  Eyes: Conjunctivae are normal. Pupils are equal, round, and reactive to light. No scleral icterus.  Neck: Neck supple.  Cardiovascular: Normal rate, regular rhythm, normal heart sounds and intact distal pulses.   No murmur heard. Pulmonary/Chest: Effort normal and breath sounds normal. No stridor. No respiratory distress. She has no rales.  Abdominal: Soft. Bowel sounds are normal. She exhibits no distension. There is no tenderness.  Musculoskeletal: Normal range of motion.       Right shoulder: She exhibits laceration (surgical incision c/d/i). She exhibits normal pulse.       Cervical back: She exhibits no tenderness and no bony tenderness.       Thoracic back: She exhibits tenderness.       Lumbar back: She exhibits tenderness.  Neurological: She is alert and oriented  to person, place, and time.  Skin: Skin is warm and dry. No rash noted.  Psychiatric: She has a normal mood and affect. Her behavior is normal.    ED Course   Procedures (including critical care time)  Labs Reviewed - No data to display Dg Thoracic Spine 2 View  08/02/2013   *RADIOLOGY REPORT*  Clinical Data: The patient passed out and fell.  Steps with back pain.  Surgery on the right humorous yesterday.  THORACIC SPINE - 2 VIEW  Comparison: Chest 07/24/2013  Findings: Diffuse bone demineralization.  Normal alignment normal alignment of the thoracic vertebrae.  Mild hypertrophic changes in the endplates.  No vertebral compression deformities. Intervertebral disc space heights are mostly preserved.  No focal bone lesions or bone destruction visualized.  Bone cortex and trabecular architecture appear intact.  No paraspinal soft tissue swelling.  IMPRESSION: Mild degenerative changes in the thoracic spine.  Normal alignment. No displaced fractures identified.   Original Report Authenticated By: Burman Nieves, M.D.   Dg Lumbar Spine 2-3 Views  08/02/2013   *RADIOLOGY REPORT*  Clinical Data: The patient passed out and fell.  Back pain. Surgery on right humorous yesterday.  LUMBAR SPINE - 2-3 VIEW  Comparison: None.  Findings: Five lumbar type vertebrae.  Normal alignment of the lumbar vertebrae.  Intervertebral disc space heights are preserved. No vertebral compression deformities.  Degenerative changes with endplate hypertrophic changes.  No focal bone lesion or bone destruction.  Bone cortex and trabecular architecture appear intact.  Inferior vena caval filter.  IMPRESSION: Mild degenerative changes in the lumbar spine.  Normal alignment. No displaced fractures identified.   Original Report Authenticated By: Burman Nieves, M.D.   Ct Head Wo Contrast  08/02/2013   *RADIOLOGY REPORT*  Clinical Data:  Syncopal episode and fall.  Dizziness.  CT HEAD WITHOUT CONTRAST CT CERVICAL SPINE WITHOUT CONTRAST   Technique:  Multidetector CT imaging of the head and cervical spine was performed following the standard protocol without intravenous contrast.  Multiplanar CT image reconstructions of the cervical spine were also generated.  Comparison:   None  CT HEAD  Findings: Mild ventricular dilatation probably representing central atrophy although normal pressure hydrocephalus is not excluded.  No significant sulci atrophy.  No mass effect or midline shift.  No abnormal extra-axial fluid collections.  Gray-white matter junctions are distinct.  Basal cisterns are not effaced.  No evidence of acute intracranial hemorrhage.  No depressed skull fractures.  Visualized paranasal sinuses and mastoid air cells are not opacified.  IMPRESSION: No acute intracranial abnormalities.  Ventricular dilatation which could represent central atrophy or normal pressure hydrocephalus. Clinical correlation is recommended.  CT CERVICAL SPINE  Findings: There is reversal of the usual cervical lordosis.  This is likely due to patient positioning but ligamentous injury muscle spasm can also have this appearance.  Degenerative changes with narrowed cervical interspaces and endplate hypertrophic changes at C3-4 and C5-6 levels.  No anterior subluxation of the cervical vertebrae.  Facet joints demonstrate normal alignment. Degenerative changes in the facet joints.  Lateral masses of C1 appear symmetrical.  The odontoid process appears intact.  No vertebral compression deformities.  No prevertebral soft tissue swelling.  No focal bone lesion or bone destruction.  Bone cortex and trabecular architecture appear intact.  Vascular calcifications in the cervical carotid arteries.  IMPRESSION: Degenerative changes in the cervical spine.  Reversal of cervical lordosis.  No displaced fractures identified.   Original Report Authenticated By: Chrissie Noa  Andria Meuse, M.D.   Ct Cervical Spine Wo Contrast  08/02/2013   *RADIOLOGY REPORT*  Clinical Data:  Syncopal episode  and fall.  Dizziness.  CT HEAD WITHOUT CONTRAST CT CERVICAL SPINE WITHOUT CONTRAST  Technique:  Multidetector CT imaging of the head and cervical spine was performed following the standard protocol without intravenous contrast.  Multiplanar CT image reconstructions of the cervical spine were also generated.  Comparison:   None  CT HEAD  Findings: Mild ventricular dilatation probably representing central atrophy although normal pressure hydrocephalus is not excluded.  No significant sulci atrophy.  No mass effect or midline shift.  No abnormal extra-axial fluid collections.  Gray-white matter junctions are distinct.  Basal cisterns are not effaced.  No evidence of acute intracranial hemorrhage.  No depressed skull fractures.  Visualized paranasal sinuses and mastoid air cells are not opacified.  IMPRESSION: No acute intracranial abnormalities.  Ventricular dilatation which could represent central atrophy or normal pressure hydrocephalus. Clinical correlation is recommended.  CT CERVICAL SPINE  Findings: There is reversal of the usual cervical lordosis.  This is likely due to patient positioning but ligamentous injury muscle spasm can also have this appearance.  Degenerative changes with narrowed cervical interspaces and endplate hypertrophic changes at C3-4 and C5-6 levels.  No anterior subluxation of the cervical vertebrae.  Facet joints demonstrate normal alignment. Degenerative changes in the facet joints.  Lateral masses of C1 appear symmetrical.  The odontoid process appears intact.  No vertebral compression deformities.  No prevertebral soft tissue swelling.  No focal bone lesion or bone destruction.  Bone cortex and trabecular architecture appear intact.  Vascular calcifications in the cervical carotid arteries.  IMPRESSION: Degenerative changes in the cervical spine.  Reversal of cervical lordosis.  No displaced fractures identified.   Original Report Authenticated By: Burman Nieves, M.D.   Today's  radiology studies independently viewed by me.     EKG - NSR, rate 75, normal axis, normal intervals, no ST/T changes, no brugada's, normal QTc,  1. Syncope     MDM  Syncope in setting of recent surgery.  Occurred after going to the bathroom.  CT head and c spine negative.  Back plain films negative.  BPs borderline, but pt reports this is her baseline. Syncope possibly from combination of dehydration, narcotic pain meds, and orthostatic response.  Labwork pending.  She also has hx of PE, but no CP, SOB, tachycardia, or hypoxia.  DDimer pending. Care transferred to Dr. Wilkie Aye pending remainder of workup.    Candyce Churn, MD 08/03/13 [phone]

## 2013-08-02 NOTE — ED Notes (Signed)
Syncopal epsiode while in restroom, per ems, nystagmus, and pt reports being dizzy. Recent shoulder surgery

## 2013-08-02 NOTE — ED Notes (Signed)
Family at bedside. 

## 2013-08-02 NOTE — ED Notes (Signed)
MD aware of BP, pt sitting up in bed, states laying down is too painful, pt alert and oriented, skin pink warm and dry, no distress noted, denies feeling dizzy, fluid bolus initiated. Pt states her BP normally runs low.

## 2013-08-02 NOTE — ED Notes (Signed)
Removed from backboard

## 2013-08-02 NOTE — ED Notes (Signed)
Called lab for update on pending results, states the labs are currently running and results should process soon. Delay explained to patient.

## 2013-08-02 NOTE — Op Note (Signed)
NAMESAIDY, ORMAND              ACCOUNT NO.:  [id number]  MEDICAL RECORD NO.:  [id number]  LOCATION:  [facility]                         FACILITY:  [facility]  PHYSICIAN:  Doralee Albino. Carola Frost, M.D. 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  PROCEDURE:  Repair of right humerus nonunion with the infuse and the cancellous allograft, and open reduction and internal fixation using Synthes locking plate.  SURGEON:  Doralee Albino. Carola Frost, M.D.  ASSISTANT:  None.  ANESTHESIA:  General.  COMPLICATIONS:  None.  I/O:  1300 mL.  IV FLUIDS:  Urinary output 1000 mL.  EBL 100 mL.  SPECIMENS:  None.  COMPLICATIONS:  None.  DISPOSITION:  To PACU.  CONDITION:  Stable.  BRIEF SUMMARY OF INDICATION FOR PROCEDURE:  Fulmore is a 67 year old right-hand dominant female who sustained a right proximal humerus fracture nearly 6 months ago.  Because of her multiple medical problems and a reasonable alignment she opted for nonsurgical management, but had persistent lack of active motion despite outstanding passive motion of the right shoulder.  CT scan confirmed a nonunion.  We discussed preoperatively the risks and benefits of the surgery including possibility of infection, nerve injury, vessel injury, persistent nonunion, symptomatic hardware, DVT, PE particularly given her history of pulmonary embolus, and need for further surgery in any event of complication.  She and her husband understood these complications clearly and did wish to proceed despite the risks.  BRIEF DESCRIPTION OF PROCEDURE:  Ms. Sherk received preoperative antibiotics, was taken to the operating room, where general anesthesia was induced.  Her right upper extremity was prepped  and draped in the usual sterile fashion, as she laid supine on the radiolucent table.  A standard deltopectoral approach was made retracting the cephalic vein laterally with the deltoid.  The clavipectoral fascia was then incised and deep retractors were placed.  The biceps tendon was identified and the pseudo capsule also was visualized which was quite prominent and did have some pseudarthrosis that was present.  This was evacuated and all of the pseudo capsule excised.  The bone ends were cleaned with curette and rongeur.  The bone ends were rasped as well back to bleeding healthy surface.  After a thorough clean out revealed a sizable defect the large Infuse sponge was placed getting excellent coverage along the outer cortex of the bone posteriorly, and then cancellous graft was placed deep to this.  A third of the sponge was then placed along the medial side, such that the graft was placed into it and then the Infuse flapped over anteriorly and similarly also along the lateral aspect of the bone, the remaining 3rd of the Infuse sponge was placed with more allograft and then this flapped over as well anteriorly in stepwise fashion, and the entire defect was filled.  We obtained provisional fixation with K- wires using the Synthes proximal humeral locking plate.  This was followed by standard fixation while my assistant maintained reduction and restoration  of length, and then final standard screws were placed followed by multiple locking screws.  The screw position, length, and trajectory was checked on multiple images with C-arm.  The wound was irrigated thoroughly, closed in standard layered fashion using a 0 Vicryl to reapproximate the deltopectoral interval, 2-0 Vicryl, and then a running 3-0 Prolene with Steri-Strips for the skin.  Sterile gently compressive dressing was applied and then a sling.  The patient was awakened from anesthesia and transported to the PACU in  stable condition.  We did place a I&O catheter at the end of the procedure.  PROGNOSIS:  Ms. Devora will be allowed gentle passive motion of the shoulder primarily focusing on pendulum for the next 2 weeks.  We will plan to see her back in the office for removal of her sutures in 10-14 days.  She may shower in 2 days.  She will have Percocet, plain oxycodone, and Zofran as needed postoperatively.  Discharge to home is anticipated if the block continues to produce good pain control.  She is at increased risk for persistent nonunion given the size of the defect and the well established pseudoarthrosis, but we are hopeful these factors mitigate by the solid fixation and biologic graft.     Doralee Albino. Carola Frost, M.D.     [facility]MEDQ  D:  08/01/2013  T:  08/02/2013  Job:  [id number]

## 2013-08-02 NOTE — ED Notes (Signed)
Pt may self administer own lovenox per home health orders per EDP [place].

## 2013-08-02 NOTE — ED Provider Notes (Signed)
Patient was received in signout by Dr. Levin Bacon. Lab work has been reviewed. D-dimer is negative. Patient has had IV hydration and has ambulated without difficulty.  The patient will be discharged home.   Shon Baton, MD 08/02/13 1059

## 2013-08-07 ENCOUNTER — Ambulatory Visit (INDEPENDENT_AMBULATORY_CARE_PROVIDER_SITE_OTHER): Payer: BC Managed Care – PPO | Admitting: Pharmacist Clinician (PhC)/ Clinical Pharmacy Specialist

## 2013-08-07 DIAGNOSIS — I82409 Acute embolism and thrombosis of unspecified deep veins of unspecified lower extremity: Secondary | ICD-10-CM

## 2013-08-07 DIAGNOSIS — Z7901 Long term (current) use of anticoagulants: Secondary | ICD-10-CM

## 2013-08-08 ENCOUNTER — Ambulatory Visit: Payer: BC Managed Care – PPO | Admitting: Pharmacist Clinician (PhC)/ Clinical Pharmacy Specialist

## 2013-08-21 ENCOUNTER — Ambulatory Visit (INDEPENDENT_AMBULATORY_CARE_PROVIDER_SITE_OTHER): Payer: BC Managed Care – PPO | Admitting: Pharmacist Clinician (PhC)/ Clinical Pharmacy Specialist

## 2013-08-21 VITALS — BP 120/66 | HR 76

## 2013-08-21 DIAGNOSIS — Z7901 Long term (current) use of anticoagulants: Secondary | ICD-10-CM

## 2013-08-21 DIAGNOSIS — I82409 Acute embolism and thrombosis of unspecified deep veins of unspecified lower extremity: Secondary | ICD-10-CM

## 2013-09-04 ENCOUNTER — Ambulatory Visit (INDEPENDENT_AMBULATORY_CARE_PROVIDER_SITE_OTHER): Payer: BC Managed Care – PPO | Admitting: Pharmacist Clinician (PhC)/ Clinical Pharmacy Specialist

## 2013-09-04 VITALS — BP 118/64 | HR 80

## 2013-09-04 DIAGNOSIS — Z7901 Long term (current) use of anticoagulants: Secondary | ICD-10-CM

## 2013-09-04 DIAGNOSIS — I82409 Acute embolism and thrombosis of unspecified deep veins of unspecified lower extremity: Secondary | ICD-10-CM

## 2013-09-04 LAB — POCT INR: INR: 2.5

## 2013-09-11 ENCOUNTER — Telehealth: Payer: Self-pay | Admitting: Pharmacist Clinician (PhC)/ Clinical Pharmacy Specialist

## 2013-09-18 ENCOUNTER — Telehealth: Payer: Self-pay | Admitting: Cardiovascular Disease

## 2013-09-18 NOTE — Telephone Encounter (Signed)
Scheduled appt for 10/27

## 2013-09-18 NOTE — Telephone Encounter (Signed)
Please regarding next coumadin visit.  Someone was supposed to call her back bout next appt

## 2013-10-09 ENCOUNTER — Ambulatory Visit (INDEPENDENT_AMBULATORY_CARE_PROVIDER_SITE_OTHER): Payer: BC Managed Care – PPO | Admitting: Pharmacist Clinician (PhC)/ Clinical Pharmacy Specialist

## 2013-10-09 VITALS — BP 120/72 | HR 88

## 2013-10-09 DIAGNOSIS — Z7901 Long term (current) use of anticoagulants: Secondary | ICD-10-CM

## 2013-10-09 DIAGNOSIS — I82409 Acute embolism and thrombosis of unspecified deep veins of unspecified lower extremity: Secondary | ICD-10-CM

## 2013-10-09 LAB — POCT INR: INR: 2.8

## 2013-11-13 ENCOUNTER — Ambulatory Visit (INDEPENDENT_AMBULATORY_CARE_PROVIDER_SITE_OTHER): Payer: BC Managed Care – PPO | Admitting: Pharmacist Clinician (PhC)/ Clinical Pharmacy Specialist

## 2013-11-13 VITALS — BP 120/64 | HR 76

## 2013-11-13 DIAGNOSIS — I82409 Acute embolism and thrombosis of unspecified deep veins of unspecified lower extremity: Secondary | ICD-10-CM

## 2013-11-13 DIAGNOSIS — Z7901 Long term (current) use of anticoagulants: Secondary | ICD-10-CM

## 2013-12-11 ENCOUNTER — Ambulatory Visit (INDEPENDENT_AMBULATORY_CARE_PROVIDER_SITE_OTHER): Payer: BC Managed Care – PPO | Admitting: Pharmacist Clinician (PhC)/ Clinical Pharmacy Specialist

## 2013-12-11 VITALS — BP 126/68 | HR 76

## 2013-12-11 DIAGNOSIS — Z7901 Long term (current) use of anticoagulants: Secondary | ICD-10-CM

## 2013-12-11 DIAGNOSIS — I82409 Acute embolism and thrombosis of unspecified deep veins of unspecified lower extremity: Secondary | ICD-10-CM

## 2013-12-11 LAB — POCT INR: INR: 2.3

## 2014-01-08 ENCOUNTER — Ambulatory Visit (INDEPENDENT_AMBULATORY_CARE_PROVIDER_SITE_OTHER): Payer: BC Managed Care – PPO | Admitting: Pharmacist Clinician (PhC)/ Clinical Pharmacy Specialist

## 2014-01-08 VITALS — BP 132/68 | HR 76

## 2014-01-08 DIAGNOSIS — Z7901 Long term (current) use of anticoagulants: Secondary | ICD-10-CM

## 2014-01-08 DIAGNOSIS — I82409 Acute embolism and thrombosis of unspecified deep veins of unspecified lower extremity: Secondary | ICD-10-CM

## 2014-01-08 LAB — POCT INR: INR: 2.5

## 2014-02-05 ENCOUNTER — Ambulatory Visit (INDEPENDENT_AMBULATORY_CARE_PROVIDER_SITE_OTHER): Payer: BC Managed Care – PPO | Admitting: Pharmacist Clinician (PhC)/ Clinical Pharmacy Specialist

## 2014-02-05 VITALS — BP 122/70 | HR 66

## 2014-02-05 DIAGNOSIS — I82409 Acute embolism and thrombosis of unspecified deep veins of unspecified lower extremity: Secondary | ICD-10-CM

## 2014-02-05 DIAGNOSIS — Z7901 Long term (current) use of anticoagulants: Secondary | ICD-10-CM

## 2014-02-05 LAB — POCT INR: INR: 2.6

## 2014-02-05 MED ORDER — WARFARIN SODIUM 7.5 MG PO TABS
ORAL_TABLET | ORAL | Status: DC
Start: 2014-02-05 — End: 2014-05-18

## 2014-03-19 ENCOUNTER — Ambulatory Visit (INDEPENDENT_AMBULATORY_CARE_PROVIDER_SITE_OTHER): Payer: BC Managed Care – PPO | Admitting: Pharmacist Clinician (PhC)/ Clinical Pharmacy Specialist

## 2014-03-19 DIAGNOSIS — Z7901 Long term (current) use of anticoagulants: Secondary | ICD-10-CM

## 2014-03-19 DIAGNOSIS — I82409 Acute embolism and thrombosis of unspecified deep veins of unspecified lower extremity: Secondary | ICD-10-CM

## 2014-03-19 LAB — POCT INR: INR: 1.5

## 2014-04-04 NOTE — Telephone Encounter (Signed)
Encounter closed---TP 04/04/14 

## 2014-04-06 ENCOUNTER — Ambulatory Visit (INDEPENDENT_AMBULATORY_CARE_PROVIDER_SITE_OTHER): Payer: BC Managed Care – PPO | Admitting: Pharmacist Clinician (PhC)/ Clinical Pharmacy Specialist

## 2014-04-06 VITALS — BP 128/66 | HR 68

## 2014-04-06 DIAGNOSIS — I82409 Acute embolism and thrombosis of unspecified deep veins of unspecified lower extremity: Secondary | ICD-10-CM

## 2014-04-06 DIAGNOSIS — Z7901 Long term (current) use of anticoagulants: Secondary | ICD-10-CM

## 2014-04-06 LAB — POCT INR: INR: 4.1

## 2014-04-18 ENCOUNTER — Ambulatory Visit (INDEPENDENT_AMBULATORY_CARE_PROVIDER_SITE_OTHER): Payer: BC Managed Care – PPO | Admitting: Pharmacist Clinician (PhC)/ Clinical Pharmacy Specialist

## 2014-04-18 DIAGNOSIS — Z7901 Long term (current) use of anticoagulants: Secondary | ICD-10-CM

## 2014-04-18 DIAGNOSIS — I82409 Acute embolism and thrombosis of unspecified deep veins of unspecified lower extremity: Secondary | ICD-10-CM

## 2014-04-18 LAB — POCT INR: INR: 1.2

## 2014-04-25 ENCOUNTER — Ambulatory Visit: Payer: BC Managed Care – PPO | Admitting: Pharmacist Clinician (PhC)/ Clinical Pharmacy Specialist

## 2014-04-25 ENCOUNTER — Ambulatory Visit (INDEPENDENT_AMBULATORY_CARE_PROVIDER_SITE_OTHER): Payer: BC Managed Care – PPO | Admitting: Pharmacist Clinician (PhC)/ Clinical Pharmacy Specialist

## 2014-04-25 DIAGNOSIS — I82409 Acute embolism and thrombosis of unspecified deep veins of unspecified lower extremity: Secondary | ICD-10-CM

## 2014-04-25 DIAGNOSIS — Z7901 Long term (current) use of anticoagulants: Secondary | ICD-10-CM

## 2014-04-25 LAB — POCT INR: INR: 2.4

## 2014-05-18 ENCOUNTER — Ambulatory Visit (INDEPENDENT_AMBULATORY_CARE_PROVIDER_SITE_OTHER): Payer: BC Managed Care – PPO | Admitting: Pharmacist Clinician (PhC)/ Clinical Pharmacy Specialist

## 2014-05-18 DIAGNOSIS — Z7901 Long term (current) use of anticoagulants: Secondary | ICD-10-CM

## 2014-05-18 DIAGNOSIS — I82409 Acute embolism and thrombosis of unspecified deep veins of unspecified lower extremity: Secondary | ICD-10-CM

## 2014-05-18 LAB — POCT INR: INR: 1.7

## 2014-05-18 MED ORDER — WARFARIN SODIUM 7.5 MG PO TABS: ORAL_TABLET | ORAL | Status: DC

## 2014-06-08 ENCOUNTER — Ambulatory Visit (INDEPENDENT_AMBULATORY_CARE_PROVIDER_SITE_OTHER): Payer: BC Managed Care – PPO | Admitting: Pharmacist Clinician (PhC)/ Clinical Pharmacy Specialist

## 2014-06-08 DIAGNOSIS — I82409 Acute embolism and thrombosis of unspecified deep veins of unspecified lower extremity: Secondary | ICD-10-CM

## 2014-06-08 DIAGNOSIS — Z7901 Long term (current) use of anticoagulants: Secondary | ICD-10-CM

## 2014-06-08 LAB — POCT INR: INR: 2.7

## 2014-07-03 ENCOUNTER — Encounter: Payer: Self-pay | Admitting: Gastroenterology

## 2014-07-04 ENCOUNTER — Ambulatory Visit (INDEPENDENT_AMBULATORY_CARE_PROVIDER_SITE_OTHER): Payer: BC Managed Care – PPO | Admitting: Pharmacist Clinician (PhC)/ Clinical Pharmacy Specialist

## 2014-07-04 ENCOUNTER — Encounter: Payer: Self-pay | Admitting: Cardiovascular Disease

## 2014-07-04 ENCOUNTER — Ambulatory Visit (INDEPENDENT_AMBULATORY_CARE_PROVIDER_SITE_OTHER): Payer: BC Managed Care – PPO | Admitting: Cardiovascular Disease

## 2014-07-04 VITALS — BP 110/64 | HR 77 | Ht 66.0 in | Wt 143.6 lb

## 2014-07-04 DIAGNOSIS — I82409 Acute embolism and thrombosis of unspecified deep veins of unspecified lower extremity: Secondary | ICD-10-CM

## 2014-07-04 DIAGNOSIS — Z7901 Long term (current) use of anticoagulants: Secondary | ICD-10-CM

## 2014-07-04 DIAGNOSIS — I2699 Other pulmonary embolism without acute cor pulmonale: Secondary | ICD-10-CM

## 2014-07-04 DIAGNOSIS — I82403 Acute embolism and thrombosis of unspecified deep veins of lower extremity, bilateral: Secondary | ICD-10-CM

## 2014-07-04 LAB — POCT INR: INR: 3.2

## 2014-07-04 NOTE — Progress Notes (Signed)
07/04/2014 ANTWONETTE FELIZ   [birth year]  [id number]  Primary Physician Tivis Ringer, MD Primary Cardiologist: [name] MD Renae Gloss   HPI:  Ms. 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 Shanon Brow Hardings. She has a history of bilateral DVT and pulmonary emboli on Coumadin anticoagulation status post IVC filter placement. She has no other cardiac risk factors. She apparently had a hypercoagulable workup by Dr. Joan Flores at [facility] however she is unaware of the findings. Since I saw her last 05/10/13 she has been completely asymptomatic    Current Outpatient Prescriptions  Medication Sig Dispense Refill  . Calcium Citrate-Vitamin D (CALCIUM CITRATE + D PO) Take 1 tablet by mouth 2 (two) times daily with a meal.      . cetirizine (ZYRTEC) 10 MG tablet Take 10 mg by mouth daily.      . Coenzyme Q10 50 MG CAPS Take 1 capsule by mouth every morning.      Marland Kitchen doxylamine, Sleep, (UNISOM) 25 MG tablet Take 25 mg by mouth at bedtime.       . Glucosamine-Chondroitin (GLUCOSAMINE CHONDR COMPLEX PO) Take 1 tablet by mouth 2 (two) times daily with a meal.      . lamoTRIgine (LAMICTAL) 100 MG tablet Take 50 mg by mouth 2 (two) times daily. Takes with 200mg  tablets for a total dose of 250mg  twice a day      . lamoTRIgine (LAMICTAL) 200 MG tablet Take 200 mg by mouth 2 (two) times daily. Takes with 50mg  tablets for a total dose of 250mg  twice a day      . Multiple Vitamin (MULTIVITAMIN WITH MINERALS) TABS tablet Take 1 tablet by mouth 2 (two) times daily with a meal.      . Omega-3 Fatty Acids (OMEGA 3 PO) Take 1 capsule by mouth every morning.      Marland Kitchen OVER THE COUNTER MEDICATION Take 1 tablet by mouth every morning. Hair Volume.      Marland Kitchen OVER THE COUNTER MEDICATION Place 1 spray into the nose as needed (dry/bloody nose). Equate Saline Nasal Spray.      Marland Kitchen  oxyCODONE-acetaminophen (PERCOCET/ROXICET) 5-325 MG per tablet Take 1 tablet by mouth every 8 (eight) hours as needed for pain.      . primidone (MYSOLINE) 250 MG tablet Take 125-250 mg by mouth 3 (three) times daily. Take one tablet (250 mg) at 10:00am; Take one half-tablet (125 mg) at 4:00 pm; Take one tablet (250 mg) at 10:00 pm.      . topiramate (TOPAMAX) 25 MG tablet Take 50-75 mg by mouth 2 (two) times daily. 3 tablets in the morning; 2 tablets in the evening      . warfarin (COUMADIN) 7.5 MG tablet Take 1 & 1/2 - 2 tablets by mouth daily as directed  165 tablet  1   No current facility-administered medications for this visit.    Allergies  Allergen Reactions  . Tegretol [Carbamazepine] Rash    History   Social History  . Marital Status: Married    Spouse Name: N/A    Number of Children: N/A  . Years of Education: N/A   Occupational History  . Not on file.   Social History Main Topics  . Smoking status: Never Smoker   . Smokeless tobacco: Not on file  . Alcohol Use: No  . Drug Use: No  .  Sexual Activity: Not on file   Other Topics Concern  . Not on file   Social History Narrative  . No narrative on file     Review of Systems: General: negative for chills, fever, night sweats or weight changes.  Cardiovascular: negative for chest pain, dyspnea on exertion, edema, orthopnea, palpitations, paroxysmal nocturnal dyspnea or shortness of breath Dermatological: negative for rash Respiratory: negative for cough or wheezing Urologic: negative for hematuria Abdominal: negative for nausea, vomiting, diarrhea, bright red blood per rectum, melena, or hematemesis Neurologic: negative for visual changes, syncope, or dizziness All other systems reviewed and are otherwise negative except as noted above.    Blood pressure 110/64, pulse 77, height 5\' 6"  (1.676 m), weight 143 lb 9.6 oz (65.137 kg).  General appearance: alert and no distress Neck: no adenopathy, no carotid bruit,  no JVD, supple, symmetrical, trachea midline and thyroid not enlarged, symmetric, no tenderness/mass/nodules Lungs: clear to auscultation bilaterally Heart: regular rate and rhythm, S1, S2 normal, no murmur, click, rub or gallop Extremities: extremities normal, atraumatic, no cyanosis or edema  EKG normal sinus rhythm at 77 without ST or T wave changes  ASSESSMENT AND PLAN:   DVT (deep venous thrombosis) On Coumadin anticoagulation      [name] MD [facility], [facility] 07/04/2014 4:53 PM

## 2014-07-04 NOTE — Patient Instructions (Signed)
Your physician recommends that you schedule a follow-up appointment in the coumadin clinic as scheduled per [facility]. Back to see Dr. Gwenlyn Found as needed.

## 2014-07-04 NOTE — Assessment & Plan Note (Signed)
On Coumadin anticoagulation

## 2014-07-13 ENCOUNTER — Telehealth: Payer: Self-pay | Admitting: *Deleted

## 2014-07-13 ENCOUNTER — Encounter: Payer: Self-pay | Admitting: Gastroenterology

## 2014-07-13 ENCOUNTER — Ambulatory Visit (INDEPENDENT_AMBULATORY_CARE_PROVIDER_SITE_OTHER): Payer: BC Managed Care – PPO | Admitting: Gastroenterology

## 2014-07-13 VITALS — BP 98/64 | HR 60 | Ht 66.0 in | Wt 142.9 lb

## 2014-07-13 DIAGNOSIS — Z1211 Encounter for screening for malignant neoplasm of colon: Secondary | ICD-10-CM | POA: Insufficient documentation

## 2014-07-13 DIAGNOSIS — Z7901 Long term (current) use of anticoagulants: Secondary | ICD-10-CM

## 2014-07-13 MED ORDER — MOVIPREP 100 G PO SOLR
1.0000 | Freq: Once | ORAL | Status: DC
Start: 2014-07-13 — End: 2014-08-10

## 2014-07-13 NOTE — Progress Notes (Signed)
Reviewed, I have talked to J.Nolte,CRNA, seizure disorder, OK to do in [place], no problem using Propofol

## 2014-07-13 NOTE — Progress Notes (Signed)
07/13/2014 Evelyn [name] [id number] February 01, 1946   HISTORY OF PRESENT ILLNESS:  This is a pleasant 68 year old female who is knew to our practice.  She has been referred here by Dr. Dagmar Hait, her PCP, to discuss colonoscopy.  She had a colonoscopy in 02/2005 at which time she had a polyp removed that was a tubular adenoma (this was done in [facility], [place]).  Never had a colonoscopy repeated in 5 years as recommended.  She denies any rectal bleeding.  Says that she's had constipation her entire life.  Right now she is only using Miralax prn, which she says works for her, but its usually a few days before she has any results.  No family history of colon cancer.  She is requesting Dr. Olevia Perches for her colonoscopy.  She has history of seizures, but the last was 14 years ago.  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    Past Medical History  Diagnosis Date  . Seizures   . 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 PE (pulmonary embolism) 2005  . History of echocardiogram 03/11/09    nl EF 60-65%  . Peripheral vascular disease     dvt leg rt  . History of recurrent UTIs     feels like may have one now  . HTDSKAJG(811.5)    Past Surgical History  Procedure Laterality Date  . Knee surgery Left 96    kneecap fx  . Insertion of vena cava filter  10  . Orif humerus fracture Right 08/01/2013    Procedure: NON-UNION REPAIR RIGHT PROXIMAL HUMERUS FRACTURE;  Surgeon: Rozanna Box, MD;  Location: [place];  Service: Orthopedics;  Laterality: Right;    reports that she has never smoked. She does not have any smokeless tobacco history on file. She reports that she does not drink alcohol or use illicit drugs. family history includes Diabetes in her mother; Stroke in her father. Allergies  Allergen Reactions  . Tegretol  [Carbamazepine] Rash      Outpatient Encounter Prescriptions as of 07/13/2014  Medication Sig  . Calcium Citrate-Vitamin D (CALCIUM CITRATE + D PO) Take 1 tablet by mouth 2 (two) times daily with a meal.  . cetirizine (ZYRTEC) 10 MG tablet Take 10 mg by mouth daily.  . Coenzyme Q10 50 MG CAPS Take 1 capsule by mouth every morning.  Marland Kitchen doxylamine, Sleep, (UNISOM) 25 MG tablet Take 25 mg by mouth at bedtime.   . Glucosamine-Chondroitin (GLUCOSAMINE CHONDR COMPLEX PO) Take 1 tablet by mouth 2 (two) times daily with a meal.  . lamoTRIgine (LAMICTAL) 100 MG tablet Take 50 mg by mouth 2 (two) times daily. Takes with 200mg  tablets for a total dose of 250mg  twice a day  . lamoTRIgine (LAMICTAL) 200 MG tablet Take 200 mg by mouth 2 (two) times daily. Takes with 50mg  tablets for a total dose of 250mg  twice a day  . Multiple Vitamin (MULTIVITAMIN WITH MINERALS) TABS tablet Take 1 tablet by mouth 2 (two) times daily with a meal.  . Omega-3 Fatty Acids (OMEGA 3 PO) Take 1 capsule by mouth every morning.  Marland Kitchen OVER THE COUNTER MEDICATION Take 1 tablet by mouth every morning. Hair Volume.  Marland Kitchen OVER THE COUNTER MEDICATION Place 1 spray into  the nose as needed (dry/bloody nose). Equate Saline Nasal Spray.  Marland Kitchen oxyCODONE-acetaminophen (PERCOCET/ROXICET) 5-325 MG per tablet Take 1 tablet by mouth every 8 (eight) hours as needed for pain.  . primidone (MYSOLINE) 250 MG tablet Take 125-250 mg by mouth 3 (three) times daily. Take one tablet (250 mg) at 10:00am; Take one half-tablet (125 mg) at 4:00 pm; Take one tablet (250 mg) at 10:00 pm.  . topiramate (TOPAMAX) 25 MG tablet Take 50-75 mg by mouth 2 (two) times daily. 3 tablets in the morning; 2 tablets in the evening  . warfarin (COUMADIN) 7.5 MG tablet Take 1 & 1/2 - 2 tablets by mouth daily as directed     REVIEW OF SYSTEMS  : All other systems reviewed and negative except where noted in the History of Present Illness.   PHYSICAL EXAM: BP 98/64  Pulse 60  Ht 5'  6" (1.676 m)  Wt 142 lb 14.4 oz (64.819 kg)  BMI 23.08 kg/m2 General: Well developed white female in no acute distress Head: Normocephalic and atraumatic Eyes:  Sclerae anicteric, conjunctiva pink. Ears: Normal auditory acuity  Lungs: Clear throughout to auscultation Heart: Regular rate and rhythm Abdomen: Soft, non-distended.  Normal bowel sounds.  Non-tender. Rectal: Deferred.  Will be done at the time of colonoscopy. Musculoskeletal: Symmetrical with no gross deformities  Skin: No lesions on visible extremities Extremities: No edema  Neurological: Alert oriented x 4, grossly non-focal Psychological:  Alert and cooperative. Normal mood and affect  ASSESSMENT AND PLAN: -Screening colonoscopy:  Will schedule with Dr. Olevia Perches at patient's request.  The risks, benefits, and alternatives were discussed with the patient and she consents to proceed.  The risks benefits and alternatives to a temporary hold of anti-coagulants/anti-platelets for the procedure were discussed with the patient she consents to proceed. Obtain clearance to hold coumadin from Dr. Gwenlyn Found or Dr. Dagmar Hait. -Constipation:  Will begin using Miralax daily. -History of DVT's and PE's:  On coumadin but also has IVC filter. -Chronic anticoagulation with coumadin -History of seizures:  Last seizure was 13 years ago

## 2014-07-13 NOTE — Telephone Encounter (Signed)
07/13/2014   RE: Evelyn [name]  DOB: 1946/06/11 MRN: [id number]   Dear Dr. Gwenlyn Found,    We have scheduled the above patient for an Colonoscopy. Our records show that she is on anticoagulation therapy.   Please advise as to how long the patient may come off her therapy of Coumadin prior to the procedure, which is scheduled for 08-10-2014.  Please fax back/ or route the completed form to Evette Georges., CMA   Sincerely,    Hope Pigeon

## 2014-07-13 NOTE — Patient Instructions (Signed)
You have been scheduled for a colonoscopy with Dr. Olevia Perches. Please follow written instructions given to you at your visit today.  Please pick up your prep kit at the pharmacy within the next 1-3 days. If you use inhalers (even only as needed), please bring them with you on the day of your procedure. Your physician has requested that you go to www.startemmi.com and enter the access code given to you at your visit today. This web site gives a general overview about your procedure. However, you should still follow specific instructions given to you by our office regarding your preparation for the procedure.

## 2014-07-16 ENCOUNTER — Encounter: Payer: Self-pay | Admitting: Internal Medicine

## 2014-07-16 NOTE — Telephone Encounter (Signed)
Pt has IVC filter. OK to interrupt coumadin A/C for colonoscopy. JJB ----- Message ----- From: Carlyle Dolly, CMA Sent: 07/13/2014 9:17 AM To: [name], MD   [facility] for patient to call back

## 2014-07-16 NOTE — Telephone Encounter (Signed)
Patient notified to stop Coumadin 5 days before procedure  Patient verbalized understanding

## 2014-07-27 ENCOUNTER — Ambulatory Visit (INDEPENDENT_AMBULATORY_CARE_PROVIDER_SITE_OTHER): Payer: BC Managed Care – PPO | Admitting: Pharmacist Clinician (PhC)/ Clinical Pharmacy Specialist

## 2014-07-27 DIAGNOSIS — Z7901 Long term (current) use of anticoagulants: Secondary | ICD-10-CM

## 2014-07-27 DIAGNOSIS — I82409 Acute embolism and thrombosis of unspecified deep veins of unspecified lower extremity: Secondary | ICD-10-CM

## 2014-07-27 LAB — POCT INR: INR: 2.5

## 2014-08-02 ENCOUNTER — Telehealth: Payer: Self-pay | Admitting: Pharmacist Clinician (PhC)/ Clinical Pharmacy Specialist

## 2014-08-02 NOTE — Telephone Encounter (Signed)
Patient has not heard from you regarding Lovenox.

## 2014-08-03 MED ORDER — ENOXAPARIN SODIUM 60 MG/0.6ML ~~LOC~~ SOLN: 60.0000 mg | Freq: Two times a day (BID) | SUBCUTANEOUS | Status: DC

## 2014-08-03 NOTE — Telephone Encounter (Signed)
Reviewed bridging with patient, will mail hard copy to her today.

## 2014-08-03 NOTE — Telephone Encounter (Signed)
[name], MD Tommy Medal, RPH-CPP            No concerns   JJB      Previous Messages      ----- Message -----  From: Tommy Medal, RPH-CPP  Sent: 08/02/2014 2:35 PM  To: Chauncy Lean, RN, [name], MD   Dr. Gwenlyn Found   Evelyn [name] has been given clearance to hold warfarin x 5 days for colonoscopy. She has IVC filter after DVT. She was told she didn't need lovenox, but has had it in the past and is more comfortable with it. Any concerns if I set that up for her?   Erasmo Downer     [facility] for patient to call with date of colonoscopy

## 2014-08-10 ENCOUNTER — Encounter: Payer: Self-pay | Admitting: Internal Medicine

## 2014-08-10 ENCOUNTER — Ambulatory Visit (AMBULATORY_SURGERY_CENTER): Payer: BC Managed Care – PPO | Admitting: Internal Medicine

## 2014-08-10 VITALS — BP 132/76 | HR 65 | Temp 97.2°F | Resp 19 | Ht 66.0 in | Wt 142.0 lb

## 2014-08-10 DIAGNOSIS — Z86010 Personal history of colonic polyps: Secondary | ICD-10-CM

## 2014-08-10 DIAGNOSIS — Z1211 Encounter for screening for malignant neoplasm of colon: Secondary | ICD-10-CM

## 2014-08-10 DIAGNOSIS — D120 Benign neoplasm of cecum: Secondary | ICD-10-CM

## 2014-08-10 DIAGNOSIS — D126 Benign neoplasm of colon, unspecified: Secondary | ICD-10-CM

## 2014-08-10 MED ORDER — SODIUM CHLORIDE 0.9 % IV SOLN: 500.0000 mL | INTRAVENOUS | Status: DC

## 2014-08-10 NOTE — Op Note (Signed)
[place]  [address]. [place], [address]   COLONOSCOPY PROCEDURE REPORT  PATIENT: Evelyn [name], Evelyn [name]  MR#: [id number] BIRTHDATE: 21-Jan-1946 , 69  yrs. old GENDER: Female ENDOSCOPIST: [place] Dragon, MD REFERRED [id number] Avva, M.D. PROCEDURE DATE:  08/10/2014 PROCEDURE:   Colonoscopy with cold biopsy polypectomy First Screening Colonoscopy - Avg.  risk and is 50 yrs.  old or older - No.  Prior Negative Screening - Now for repeat screening. N/A  History of Adenoma - Now for follow-up colonoscopy & has been > or = to 3 yrs.  Yes hx of adenoma.  Has been 3 or more years since last colonoscopy.  Polyps Removed Today? Yes. ASA CLASS:   Class II INDICATIONS:pprior colonoscopy in March 2006 showed tubular adenoma. The patient has been on Coumadin which is on hold she is bridged with Lovenox. MEDICATIONS: MAC sedation, administered by CRNA and propofol (Diprivan) 300mg  IV  DESCRIPTION OF PROCEDURE:   After the risks benefits and alternatives of the procedure were thoroughly explained, informed consent was obtained.  A digital rectal exam revealed no abnormalities of the rectum.   The LB [id number]  endoscope was introduced through the anus and advanced to the cecum, which was identified by both the appendix and ileocecal valve. No adverse events experienced.   The quality of the prep was excellent, using MoviPrep  The instrument was then slowly withdrawn as the colon was fully examined.      COLON FINDINGS: A sessile polyp ranging between 3-61mm in size was found at the cecum.  A polypectomy was performed with cold forceps. The resection was complete and the polyp tissue was completely retrieved.  Retroflexed views revealed no abnormalities. The time to cecum=13 minutes 52 seconds.  Withdrawal time=7 minutes 17 seconds.  The scope was withdrawn and the procedure completed. COMPLICATIONS: There were no complications.  ENDOSCOPIC IMPRESSION: Sessile  polyp ranging between 3-34mm in size was found at the cecum; polypectomy was performed with cold forceps  RECOMMENDATIONS: 1.  Await pathology results 2.  high fiber diet 3. Resume Coumadin tomorrow since the polyp was very small 4. Continue Lovenox until INR therapeutic, have INR checked on Mon 08/13/2014 5. recall colonoscopy pending path report  eSigned:  [place] Dragon, MD 08/10/2014 9:09 AM   cc:   PATIENT NAME:  Malicia, Blasdel MR#: [id number]

## 2014-08-10 NOTE — Progress Notes (Signed)
Called to room to assist during endoscopic procedure.  Patient ID and intended procedure confirmed with present staff. Received instructions for my participation in the procedure from the performing physician.  

## 2014-08-10 NOTE — Patient Instructions (Signed)
YOU HAD AN ENDOSCOPIC PROCEDURE TODAY AT THE [facility]: Refer to the procedure report that was given to you for any specific questions about what was found during the examination.  If the procedure report does not answer your questions, please call your gastroenterologist to clarify.  If you requested that your care partner not be given the details of your procedure findings, then the procedure report has been included in a sealed envelope for you to review at your convenience later.  YOU SHOULD EXPECT: Some feelings of bloating in the abdomen. Passage of more gas than usual.  Walking can help get rid of the air that was put into your GI tract during the procedure and reduce the bloating. If you had a lower endoscopy (such as a colonoscopy or flexible sigmoidoscopy) you may notice spotting of blood in your stool or on the toilet paper. If you underwent a bowel prep for your procedure, then you may not have a normal bowel movement for a few days.  DIET: Your first meal following the procedure should be a light meal and then it is ok to progress to your normal diet.  A half-sandwich or bowl of soup is an example of a good first meal.  Heavy or fried foods are harder to digest and may make you feel nauseous or bloated.  Likewise meals heavy in dairy and vegetables can cause extra gas to form and this can also increase the bloating.  Drink plenty of fluids but you should avoid alcoholic beverages for 24 hours.  ACTIVITY: Your care partner should take you home directly after the procedure.  You should plan to take it easy, moving slowly for the rest of the day.  You can resume normal activity the day after the procedure however you should NOT DRIVE or use heavy machinery for 24 hours (because of the sedation medicines used during the test).    SYMPTOMS TO REPORT IMMEDIATELY: A gastroenterologist can be reached at any hour.  During normal business hours, 8:30 AM to 5:00 PM Monday through Friday,  call [phone].  After hours and on weekends, please call the GI answering service at [phone] who will take a message and have the physician on call contact you.   Following lower endoscopy (colonoscopy or flexible sigmoidoscopy):  Excessive amounts of blood in the stool  Significant tenderness or worsening of abdominal pains  Swelling of the abdomen that is new, acute  Fever of 100F or higher    FOLLOW UP: If any biopsies were taken you will be contacted by phone or by letter within the next 1-3 weeks.  Call your gastroenterologist if you have not heard about the biopsies in 3 weeks.  Our staff will call the home number listed on your records the next business day following your procedure to check on you and address any questions or concerns that you may have at that time regarding the information given to you following your procedure. This is a courtesy call and so if there is no answer at the home number and we have not heard from you through the emergency physician on call, we will assume that you have returned to your regular daily activities without incident.  SIGNATURES/CONFIDENTIALITY: You and/or your care partner have signed paperwork which will be entered into your electronic medical record.  These signatures attest to the fact that that the information above on your After Visit Summary has been reviewed and is understood.  Full responsibility of the confidentiality   of this discharge information lies with you and/or your care-partner.   INFORMATION ON POLYPS AND HIGH FIBER DIET GIVEN TO YOU TODAY  RESUME COUMADIN TOMORROW 08/11/14  CONTINUE LOVENOX UNTIL INR THERAPEUTIC, HAVE INR CHECKED ON Monday 08/13/14

## 2014-08-10 NOTE — Progress Notes (Signed)
Procedure ends, to recovery, report given and VSS. 

## 2014-08-13 ENCOUNTER — Telehealth: Payer: Self-pay

## 2014-08-13 NOTE — Telephone Encounter (Signed)
Left message on answering machine. 

## 2014-08-15 ENCOUNTER — Ambulatory Visit: Payer: BC Managed Care – PPO | Admitting: Pharmacist Clinician (PhC)/ Clinical Pharmacy Specialist

## 2014-08-17 ENCOUNTER — Ambulatory Visit (INDEPENDENT_AMBULATORY_CARE_PROVIDER_SITE_OTHER): Payer: BC Managed Care – PPO | Admitting: Pharmacist Clinician (PhC)/ Clinical Pharmacy Specialist

## 2014-08-17 ENCOUNTER — Ambulatory Visit: Payer: BC Managed Care – PPO | Admitting: Pharmacist Clinician (PhC)/ Clinical Pharmacy Specialist

## 2014-08-17 DIAGNOSIS — Z7901 Long term (current) use of anticoagulants: Secondary | ICD-10-CM

## 2014-08-17 DIAGNOSIS — I82409 Acute embolism and thrombosis of unspecified deep veins of unspecified lower extremity: Secondary | ICD-10-CM

## 2014-08-17 LAB — POCT INR: INR: 2.4

## 2014-08-28 ENCOUNTER — Encounter: Payer: Self-pay | Admitting: Internal Medicine

## 2014-08-29 ENCOUNTER — Encounter: Payer: Self-pay | Admitting: *Deleted

## 2014-09-14 ENCOUNTER — Ambulatory Visit: Payer: BC Managed Care – PPO | Admitting: Pharmacist Clinician (PhC)/ Clinical Pharmacy Specialist

## 2014-09-21 ENCOUNTER — Ambulatory Visit (INDEPENDENT_AMBULATORY_CARE_PROVIDER_SITE_OTHER): Payer: BC Managed Care – PPO | Admitting: Pharmacist Clinician (PhC)/ Clinical Pharmacy Specialist

## 2014-09-21 DIAGNOSIS — I82409 Acute embolism and thrombosis of unspecified deep veins of unspecified lower extremity: Secondary | ICD-10-CM

## 2014-09-21 DIAGNOSIS — Z7901 Long term (current) use of anticoagulants: Secondary | ICD-10-CM

## 2014-09-21 LAB — POCT INR: INR: 2.5

## 2014-10-19 ENCOUNTER — Ambulatory Visit (INDEPENDENT_AMBULATORY_CARE_PROVIDER_SITE_OTHER): Payer: BC Managed Care – PPO | Admitting: Pharmacist Clinician (PhC)/ Clinical Pharmacy Specialist

## 2014-10-19 DIAGNOSIS — Z7901 Long term (current) use of anticoagulants: Secondary | ICD-10-CM

## 2014-10-19 DIAGNOSIS — I82409 Acute embolism and thrombosis of unspecified deep veins of unspecified lower extremity: Secondary | ICD-10-CM

## 2014-10-19 LAB — POCT INR: INR: 1.8

## 2014-11-07 IMAGING — CR DG CHEST 2V
3 series · 3 of 3 positions shown · non-contrast
Comparison: 05/23/2013

CLINICAL DATA: Preadmission, preop

CHEST - 2 VIEW

[w chest pa (1 of 2)]
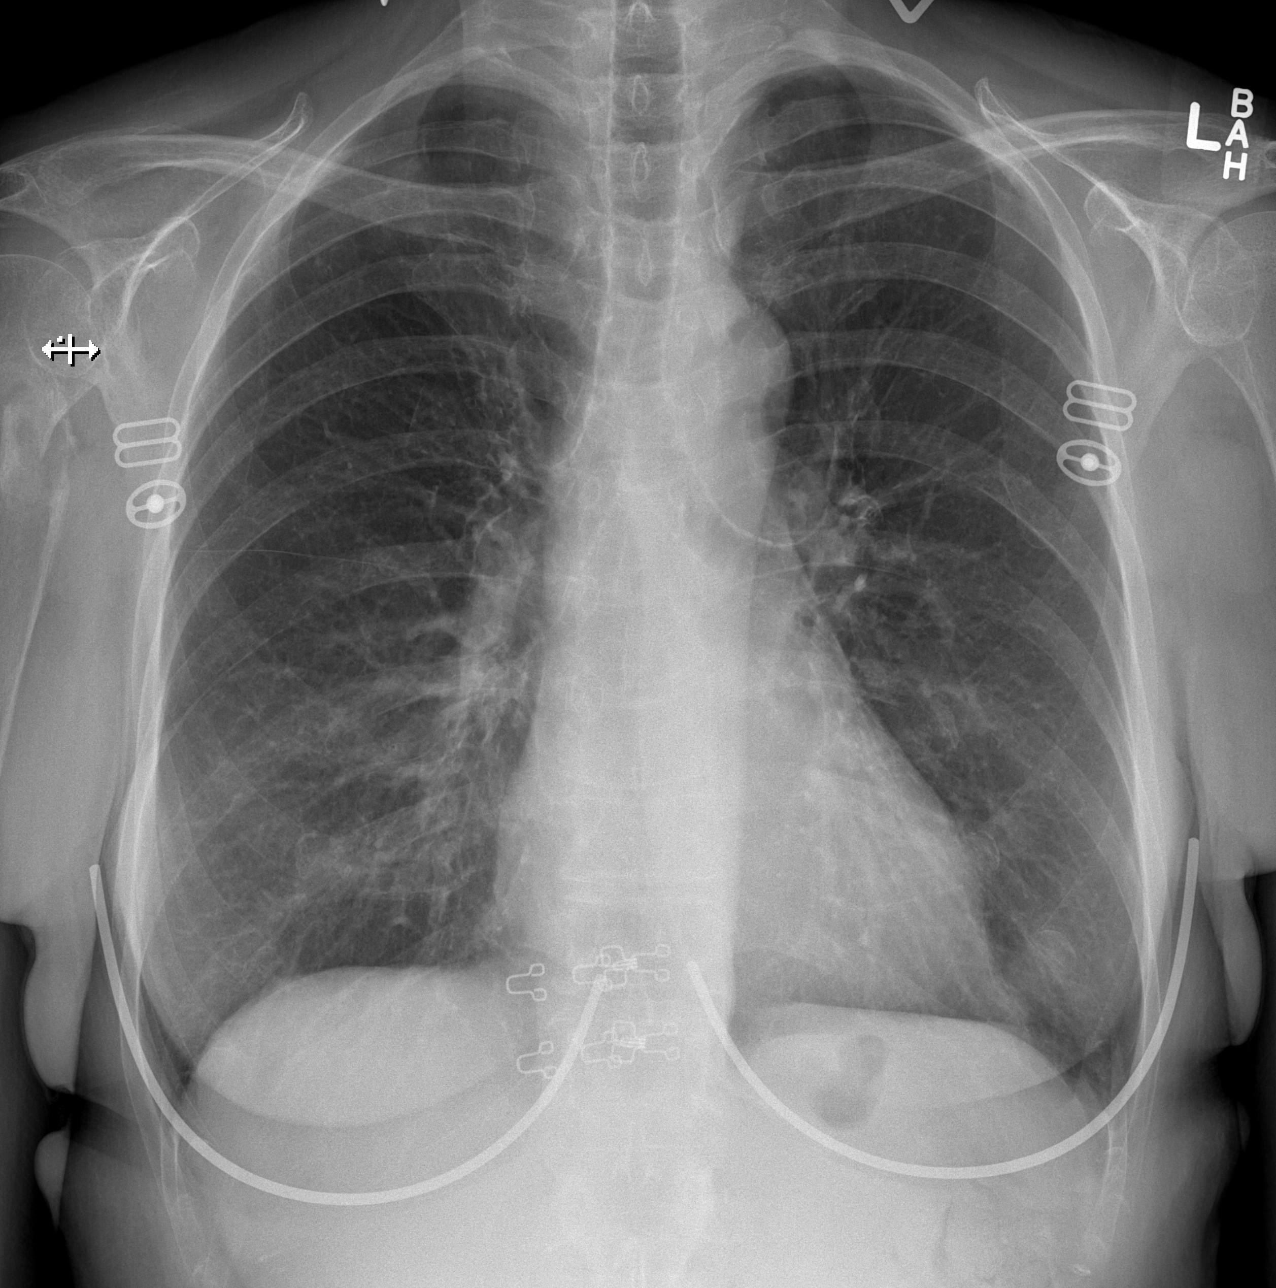

[w chest lat]
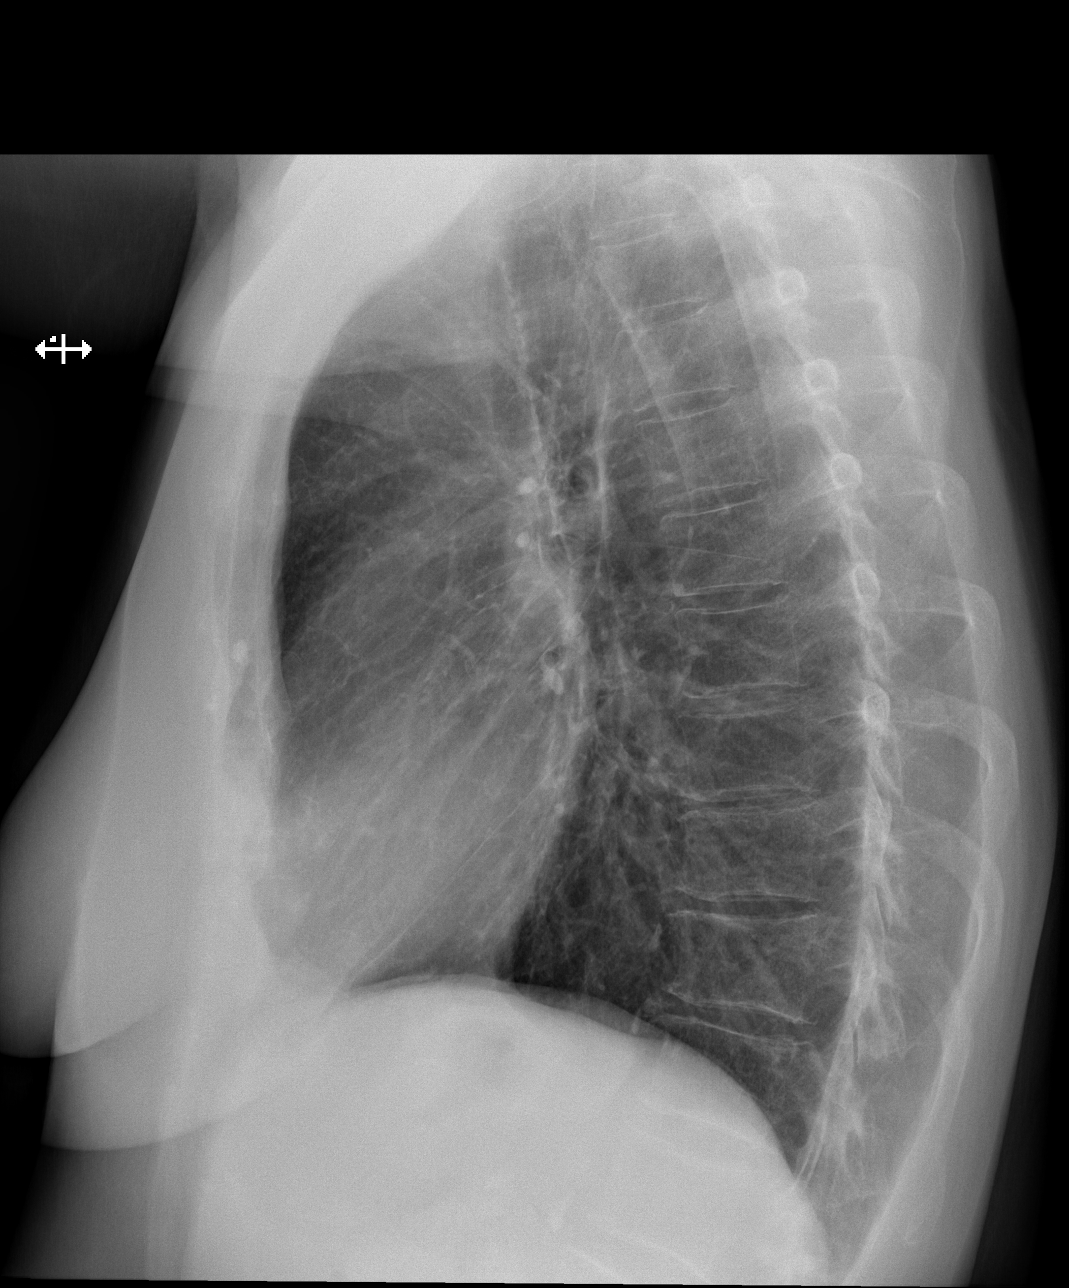

[w chest pa (2 of 2)]
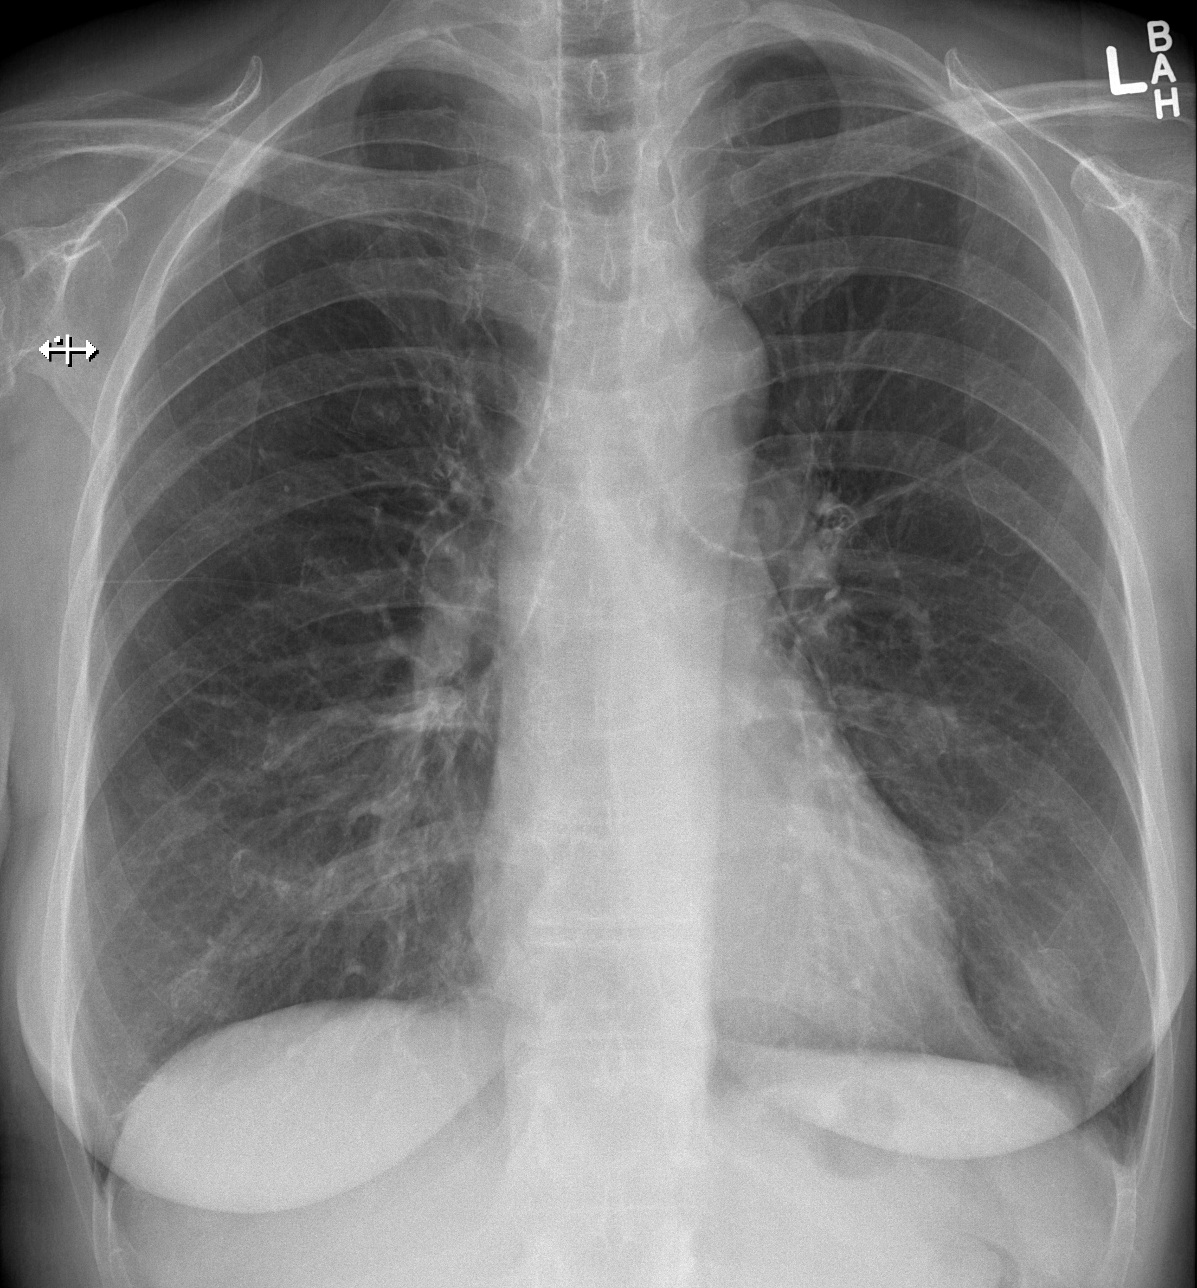

[3 of 3 positions shown; findings below may reference images not displayed]

FINDINGS: Cardiomediastinal silhouette is unremarkable.  Displaced
fracture of proximal right humerus partially visualized.

No acute infiltrate or pleural effusion.  No pulmonary edema.
IMPRESSION: No active disease.

## 2014-11-16 ENCOUNTER — Ambulatory Visit (INDEPENDENT_AMBULATORY_CARE_PROVIDER_SITE_OTHER): Payer: BC Managed Care – PPO | Admitting: Pharmacist Clinician (PhC)/ Clinical Pharmacy Specialist

## 2014-11-16 DIAGNOSIS — I82409 Acute embolism and thrombosis of unspecified deep veins of unspecified lower extremity: Secondary | ICD-10-CM

## 2014-11-16 DIAGNOSIS — Z7901 Long term (current) use of anticoagulants: Secondary | ICD-10-CM

## 2014-11-16 LAB — POCT INR: INR: 2.9

## 2014-11-16 MED ORDER — WARFARIN SODIUM 7.5 MG PO TABS: ORAL_TABLET | ORAL | Status: DC

## 2014-12-21 ENCOUNTER — Ambulatory Visit (INDEPENDENT_AMBULATORY_CARE_PROVIDER_SITE_OTHER): Payer: BLUE CROSS/BLUE SHIELD | Admitting: Pharmacist Clinician (PhC)/ Clinical Pharmacy Specialist

## 2014-12-21 DIAGNOSIS — Z7901 Long term (current) use of anticoagulants: Secondary | ICD-10-CM

## 2014-12-21 DIAGNOSIS — I82409 Acute embolism and thrombosis of unspecified deep veins of unspecified lower extremity: Secondary | ICD-10-CM

## 2014-12-21 LAB — POCT INR: INR: 2

## 2014-12-24 DIAGNOSIS — G40109 Localization-related (focal) (partial) symptomatic epilepsy and epileptic syndromes with simple partial seizures, not intractable, without status epilepticus: Secondary | ICD-10-CM

## 2015-01-18 ENCOUNTER — Ambulatory Visit (INDEPENDENT_AMBULATORY_CARE_PROVIDER_SITE_OTHER): Payer: BLUE CROSS/BLUE SHIELD | Admitting: Pharmacist Clinician (PhC)/ Clinical Pharmacy Specialist

## 2015-01-18 ENCOUNTER — Ambulatory Visit: Payer: BLUE CROSS/BLUE SHIELD | Admitting: Pharmacist Clinician (PhC)/ Clinical Pharmacy Specialist

## 2015-01-18 DIAGNOSIS — Z7901 Long term (current) use of anticoagulants: Secondary | ICD-10-CM

## 2015-01-18 DIAGNOSIS — I82409 Acute embolism and thrombosis of unspecified deep veins of unspecified lower extremity: Secondary | ICD-10-CM

## 2015-01-18 LAB — POCT INR: INR: 1.3

## 2015-02-01 ENCOUNTER — Ambulatory Visit (INDEPENDENT_AMBULATORY_CARE_PROVIDER_SITE_OTHER): Payer: BLUE CROSS/BLUE SHIELD | Admitting: Pharmacist Clinician (PhC)/ Clinical Pharmacy Specialist

## 2015-02-01 DIAGNOSIS — I82409 Acute embolism and thrombosis of unspecified deep veins of unspecified lower extremity: Secondary | ICD-10-CM

## 2015-02-01 DIAGNOSIS — Z7901 Long term (current) use of anticoagulants: Secondary | ICD-10-CM

## 2015-02-01 LAB — POCT INR: INR: 2.3

## 2015-03-01 ENCOUNTER — Ambulatory Visit (INDEPENDENT_AMBULATORY_CARE_PROVIDER_SITE_OTHER): Payer: BLUE CROSS/BLUE SHIELD | Admitting: Pharmacist Clinician (PhC)/ Clinical Pharmacy Specialist

## 2015-03-01 DIAGNOSIS — Z7901 Long term (current) use of anticoagulants: Secondary | ICD-10-CM

## 2015-03-01 DIAGNOSIS — I82409 Acute embolism and thrombosis of unspecified deep veins of unspecified lower extremity: Secondary | ICD-10-CM

## 2015-03-01 LAB — POCT INR: INR: 2.3

## 2015-03-29 ENCOUNTER — Ambulatory Visit (INDEPENDENT_AMBULATORY_CARE_PROVIDER_SITE_OTHER): Payer: BLUE CROSS/BLUE SHIELD | Admitting: Pharmacist Clinician (PhC)/ Clinical Pharmacy Specialist

## 2015-03-29 DIAGNOSIS — Z7901 Long term (current) use of anticoagulants: Secondary | ICD-10-CM | POA: Diagnosis not present

## 2015-03-29 DIAGNOSIS — I82409 Acute embolism and thrombosis of unspecified deep veins of unspecified lower extremity: Secondary | ICD-10-CM | POA: Diagnosis not present

## 2015-03-29 LAB — POCT INR: INR: 1.5

## 2015-04-12 ENCOUNTER — Ambulatory Visit (INDEPENDENT_AMBULATORY_CARE_PROVIDER_SITE_OTHER): Payer: BLUE CROSS/BLUE SHIELD | Admitting: Pharmacist Clinician (PhC)/ Clinical Pharmacy Specialist

## 2015-04-12 DIAGNOSIS — I82409 Acute embolism and thrombosis of unspecified deep veins of unspecified lower extremity: Secondary | ICD-10-CM

## 2015-04-12 DIAGNOSIS — Z7901 Long term (current) use of anticoagulants: Secondary | ICD-10-CM | POA: Diagnosis not present

## 2015-04-12 LAB — POCT INR: INR: 2.7

## 2015-04-19 ENCOUNTER — Other Ambulatory Visit: Payer: Self-pay | Admitting: Pharmacist Clinician (PhC)/ Clinical Pharmacy Specialist

## 2015-05-10 ENCOUNTER — Ambulatory Visit: Payer: BLUE CROSS/BLUE SHIELD | Admitting: Pharmacist Clinician (PhC)/ Clinical Pharmacy Specialist

## 2015-05-10 ENCOUNTER — Ambulatory Visit (INDEPENDENT_AMBULATORY_CARE_PROVIDER_SITE_OTHER): Payer: BLUE CROSS/BLUE SHIELD | Admitting: Pharmacist Clinician (PhC)/ Clinical Pharmacy Specialist

## 2015-05-10 DIAGNOSIS — I82409 Acute embolism and thrombosis of unspecified deep veins of unspecified lower extremity: Secondary | ICD-10-CM | POA: Diagnosis not present

## 2015-05-10 DIAGNOSIS — Z7901 Long term (current) use of anticoagulants: Secondary | ICD-10-CM | POA: Diagnosis not present

## 2015-05-10 LAB — POCT INR: INR: 3.6

## 2015-05-24 ENCOUNTER — Ambulatory Visit (INDEPENDENT_AMBULATORY_CARE_PROVIDER_SITE_OTHER): Payer: BLUE CROSS/BLUE SHIELD | Admitting: Pharmacist Clinician (PhC)/ Clinical Pharmacy Specialist

## 2015-05-24 DIAGNOSIS — Z7901 Long term (current) use of anticoagulants: Secondary | ICD-10-CM | POA: Diagnosis not present

## 2015-05-24 DIAGNOSIS — I82409 Acute embolism and thrombosis of unspecified deep veins of unspecified lower extremity: Secondary | ICD-10-CM | POA: Diagnosis not present

## 2015-05-24 LAB — POCT INR: INR: 4.1

## 2015-05-24 NOTE — Patient Instructions (Signed)
Xarelto - taken with largest meal of the day, once daily  Eliquis - taken twice daily  Savaysa - taken once daily

## 2015-06-07 ENCOUNTER — Ambulatory Visit (INDEPENDENT_AMBULATORY_CARE_PROVIDER_SITE_OTHER): Payer: BLUE CROSS/BLUE SHIELD | Admitting: Pharmacist

## 2015-06-07 DIAGNOSIS — Z7901 Long term (current) use of anticoagulants: Secondary | ICD-10-CM

## 2015-06-07 DIAGNOSIS — I82409 Acute embolism and thrombosis of unspecified deep veins of unspecified lower extremity: Secondary | ICD-10-CM | POA: Diagnosis not present

## 2015-06-07 LAB — POCT INR: INR: 3.1

## 2015-06-21 ENCOUNTER — Ambulatory Visit (INDEPENDENT_AMBULATORY_CARE_PROVIDER_SITE_OTHER): Payer: BLUE CROSS/BLUE SHIELD | Admitting: Pharmacist Clinician (PhC)/ Clinical Pharmacy Specialist

## 2015-06-21 DIAGNOSIS — Z7901 Long term (current) use of anticoagulants: Secondary | ICD-10-CM | POA: Diagnosis not present

## 2015-06-21 DIAGNOSIS — I82409 Acute embolism and thrombosis of unspecified deep veins of unspecified lower extremity: Secondary | ICD-10-CM | POA: Diagnosis not present

## 2015-06-21 LAB — POCT INR: INR: 5.2

## 2015-07-01 ENCOUNTER — Ambulatory Visit (INDEPENDENT_AMBULATORY_CARE_PROVIDER_SITE_OTHER): Payer: BLUE CROSS/BLUE SHIELD | Admitting: Pharmacist Clinician (PhC)/ Clinical Pharmacy Specialist

## 2015-07-01 DIAGNOSIS — I82409 Acute embolism and thrombosis of unspecified deep veins of unspecified lower extremity: Secondary | ICD-10-CM | POA: Diagnosis not present

## 2015-07-01 DIAGNOSIS — Z7901 Long term (current) use of anticoagulants: Secondary | ICD-10-CM

## 2015-07-01 LAB — POCT INR: INR: 3.5

## 2015-07-10 ENCOUNTER — Ambulatory Visit (INDEPENDENT_AMBULATORY_CARE_PROVIDER_SITE_OTHER): Payer: BLUE CROSS/BLUE SHIELD | Admitting: Pharmacist Clinician (PhC)/ Clinical Pharmacy Specialist

## 2015-07-10 DIAGNOSIS — I82409 Acute embolism and thrombosis of unspecified deep veins of unspecified lower extremity: Secondary | ICD-10-CM | POA: Diagnosis not present

## 2015-07-10 DIAGNOSIS — Z7901 Long term (current) use of anticoagulants: Secondary | ICD-10-CM

## 2015-07-10 LAB — POCT INR: INR: 5.6

## 2015-07-22 LAB — PROTIME-INR: INR: 2.7 — AB (ref 0.9–1.1)

## 2015-07-23 ENCOUNTER — Ambulatory Visit (INDEPENDENT_AMBULATORY_CARE_PROVIDER_SITE_OTHER): Payer: BLUE CROSS/BLUE SHIELD | Admitting: Pharmacist Clinician (PhC)/ Clinical Pharmacy Specialist

## 2015-07-23 DIAGNOSIS — I82409 Acute embolism and thrombosis of unspecified deep veins of unspecified lower extremity: Secondary | ICD-10-CM

## 2015-08-21 ENCOUNTER — Ambulatory Visit (INDEPENDENT_AMBULATORY_CARE_PROVIDER_SITE_OTHER): Payer: BLUE CROSS/BLUE SHIELD | Admitting: Pharmacist Clinician (PhC)/ Clinical Pharmacy Specialist

## 2015-08-21 ENCOUNTER — Other Ambulatory Visit: Payer: Self-pay | Admitting: Pharmacist Clinician (PhC)/ Clinical Pharmacy Specialist

## 2015-08-21 DIAGNOSIS — Z7901 Long term (current) use of anticoagulants: Secondary | ICD-10-CM

## 2015-08-21 DIAGNOSIS — I82409 Acute embolism and thrombosis of unspecified deep veins of unspecified lower extremity: Secondary | ICD-10-CM | POA: Diagnosis not present

## 2015-08-21 LAB — POCT INR: INR: 3.6

## 2015-08-21 MED ORDER — WARFARIN SODIUM 7.5 MG PO TABS: ORAL_TABLET | ORAL | Status: DC

## 2015-08-27 ENCOUNTER — Other Ambulatory Visit: Payer: Self-pay | Admitting: Obstetrics and Gynecology

## 2015-08-28 ENCOUNTER — Emergency Department (INDEPENDENT_AMBULATORY_CARE_PROVIDER_SITE_OTHER): Payer: BLUE CROSS/BLUE SHIELD

## 2015-08-28 ENCOUNTER — Emergency Department (HOSPITAL_COMMUNITY)
Admission: EM | Admit: 2015-08-28 | Discharge: 2015-08-28 | Disposition: A | Discharge status: Home / Self Care | Payer: BLUE CROSS/BLUE SHIELD | Source: Home / Self Care | Attending: Emergency Medicine | Admitting: Emergency Medicine

## 2015-08-28 ENCOUNTER — Encounter (HOSPITAL_COMMUNITY): Payer: Self-pay | Admitting: Emergency Medicine

## 2015-08-28 DIAGNOSIS — S60152A Contusion of left little finger with damage to nail, initial encounter: Secondary | ICD-10-CM

## 2015-08-28 DIAGNOSIS — S6992XA Unspecified injury of left wrist, hand and finger(s), initial encounter: Secondary | ICD-10-CM | POA: Diagnosis not present

## 2015-08-28 LAB — CYTOLOGY - PAP

## 2015-08-28 NOTE — ED Notes (Signed)
C/o left pinky finger injury States she smashed her pinky finger in the trunk this morning while at store Bandage finger was used as tx

## 2015-08-28 NOTE — Discharge Instructions (Signed)
You have some bruising under your fingernail. Nothing is broken. Please apply ice to the finger several times today. This will heal up nicely on its own. You can take Tylenol as needed for pain.

## 2015-08-28 NOTE — ED Provider Notes (Signed)
CSN: [id number]     Arrival date & time 08/28/15  1314 History   First MD Initiated Contact with Patient 08/28/15 1418     Chief Complaint  Patient presents with  . Finger Injury   (Consider location/radiation/quality/duration/timing/severity/associated sxs/prior Treatment) HPI She is a 69 year old woman here for evaluation of left little finger injury. She states that about 12:45pm she was at the grocery store and accidentally shut her little finger in the trunk. She states initially she had quite a bit of pain at the tip of her finger. She notes a bruise under the nail. At this time, she does not have much pain. She is able to fully flex and extend the finger without discomfort. She is on Coumadin for history of blood clots. She states her INR was checked last week and was slightly elevated. They have decreased her dose and she is following up later this week.  Past Medical History  Diagnosis Date  . Seizures   . 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 PE (pulmonary embolism) 2005  . History of echocardiogram 03/11/09    nl EF 60-65%  . Peripheral vascular disease     dvt leg rt  . History of recurrent UTIs     feels like may have one now  . BHALPFXT(024.0)    Past Surgical History  Procedure Laterality Date  . Knee surgery Left 96    kneecap fx  . Insertion of vena cava filter  10  . Orif humerus fracture Right 08/01/2013    Procedure: NON-UNION REPAIR RIGHT PROXIMAL HUMERUS FRACTURE;  Surgeon: Rozanna Box, MD;  Location: [facility];  Service: Orthopedics;  Laterality: Right;   Family History  Problem Relation Age of Onset  . Diabetes Mother   . Stroke Father    Social History  Substance Use Topics  . Smoking status: Never Smoker   . Smokeless tobacco: None  . Alcohol Use: No   OB History    No data available     Review of  Systems As in history of present illness Allergies  Tegretol  Home Medications   Prior to Admission medications   Medication Sig Start Date End Date Taking? Authorizing Provider  Calcium Citrate-Vitamin D (CALCIUM CITRATE + D PO) Take 1 tablet by mouth 2 (two) times daily with a meal.    Historical Provider, MD  cetirizine (ZYRTEC) 10 MG tablet Take 10 mg by mouth daily.    Historical Provider, MD  Coenzyme Q10 50 MG CAPS Take 1 capsule by mouth every morning.    Historical Provider, MD  doxylamine, Sleep, (UNISOM) 25 MG tablet Take 25 mg by mouth at bedtime.     Historical Provider, MD  enoxaparin (LOVENOX) 60 MG/0.6ML injection Inject 0.6 mLs (60 mg total) into the skin every 12 (twelve) hours. 08/03/14   [name], MD  Glucosamine-Chondroitin (GLUCOSAMINE CHONDR COMPLEX PO) Take 1 tablet by mouth 2 (two) times daily with a meal.    Historical Provider, MD  lamoTRIgine (LAMICTAL) 100 MG tablet Take 50 mg by mouth 2 (two) times daily. Takes with 200mg  tablets for a total dose of 250mg  twice a day    Historical Provider, MD  lamoTRIgine (LAMICTAL) 200 MG tablet Take 200 mg by mouth 2 (two) times daily. Takes with 50mg  tablets for a total dose of 250mg   twice a day    Historical Provider, MD  mirtazapine (REMERON) 7.5 MG tablet Take 0.5 tablets by mouth at bedtime. 04/26/15   Historical Provider, MD  Multiple Vitamin (MULTIVITAMIN WITH MINERALS) TABS tablet Take 1 tablet by mouth 2 (two) times daily with a meal.    Historical Provider, MD  Omega-3 Fatty Acids (OMEGA 3 PO) Take 1 capsule by mouth every morning.    Historical Provider, MD  OVER THE COUNTER MEDICATION Take 1 tablet by mouth every morning. Hair Volume.    Historical Provider, MD  OVER THE COUNTER MEDICATION Place 1 spray into the nose as needed (dry/bloody nose). Equate Saline Nasal Spray.    Historical Provider, MD  oxyCODONE-acetaminophen (PERCOCET/ROXICET) 5-325 MG per tablet Take 1 tablet by mouth every 8 (eight) hours as  needed for pain.    Historical Provider, MD  topiramate (TOPAMAX) 25 MG tablet Take 50-75 mg by mouth 2 (two) times daily. 3 tablets in the morning; 2 tablets in the evening    Historical Provider, MD  warfarin (COUMADIN) 7.5 MG tablet TAKE 1&1/2 TO 2 TABLETS BY MOUTH DAILY AS DIRECTED 08/21/15   [name], MD   Meds Ordered and Administered this Visit  Medications - No data to display  BP 135/58 mmHg  Pulse 85  Temp(Src) 97.5 F (36.4 C) (Oral)  Resp 18  SpO2 100% No data found.   Physical Exam  Constitutional: She is oriented to person, place, and time. She appears well-developed and well-nourished. No distress.  Cardiovascular: Normal rate.   Pulmonary/Chest: Effort normal.  Musculoskeletal:  Left little finger: No swelling. She does have a small subungual hematoma. Mild tenderness at the tip of the little finger. She has full active range of motion.  Neurological: She is alert and oriented to person, place, and time.    ED Course  Procedures (including critical care time)  Labs Review Labs Reviewed - No data to display  Imaging Review Dg Finger Little Left  08/28/2015   CLINICAL DATA:  Slammed finger in a car trunk.  EXAM: LEFT LITTLE FINGER 2+V  COMPARISON:  None.  FINDINGS: The joint spaces are maintained.  No acute fracture is identified.  IMPRESSION: No acute fracture.   Electronically Signed   By: Marijo Sanes M.D.   On: 08/28/2015 15:02      MDM   1. Injury of left little finger, initial encounter   2. Subungual hematoma of fifth finger of left hand, initial encounter    X-ray negative for fracture. Discussed symptomatic care with ice and Tylenol. Follow-up as needed.    Melony Overly, MD 08/28/15 [phone]

## 2015-09-09 ENCOUNTER — Ambulatory Visit (INDEPENDENT_AMBULATORY_CARE_PROVIDER_SITE_OTHER): Payer: BLUE CROSS/BLUE SHIELD | Admitting: Pharmacist Clinician (PhC)/ Clinical Pharmacy Specialist

## 2015-09-09 ENCOUNTER — Ambulatory Visit: Payer: BLUE CROSS/BLUE SHIELD | Admitting: Pharmacist Clinician (PhC)/ Clinical Pharmacy Specialist

## 2015-09-09 DIAGNOSIS — I82409 Acute embolism and thrombosis of unspecified deep veins of unspecified lower extremity: Secondary | ICD-10-CM | POA: Diagnosis not present

## 2015-09-09 DIAGNOSIS — Z7901 Long term (current) use of anticoagulants: Secondary | ICD-10-CM | POA: Diagnosis not present

## 2015-09-09 LAB — POCT INR: INR: 3.1

## 2015-09-23 ENCOUNTER — Ambulatory Visit (INDEPENDENT_AMBULATORY_CARE_PROVIDER_SITE_OTHER): Payer: BLUE CROSS/BLUE SHIELD | Admitting: Pharmacist Clinician (PhC)/ Clinical Pharmacy Specialist

## 2015-09-23 DIAGNOSIS — Z7901 Long term (current) use of anticoagulants: Secondary | ICD-10-CM

## 2015-09-23 DIAGNOSIS — I82409 Acute embolism and thrombosis of unspecified deep veins of unspecified lower extremity: Secondary | ICD-10-CM | POA: Diagnosis not present

## 2015-09-23 LAB — POCT INR: INR: 2.5

## 2015-10-21 ENCOUNTER — Ambulatory Visit (INDEPENDENT_AMBULATORY_CARE_PROVIDER_SITE_OTHER): Payer: BLUE CROSS/BLUE SHIELD | Admitting: Pharmacist Clinician (PhC)/ Clinical Pharmacy Specialist

## 2015-10-21 DIAGNOSIS — I82409 Acute embolism and thrombosis of unspecified deep veins of unspecified lower extremity: Secondary | ICD-10-CM

## 2015-10-21 DIAGNOSIS — Z7901 Long term (current) use of anticoagulants: Secondary | ICD-10-CM

## 2015-10-21 LAB — POCT INR: INR: 3.6

## 2015-11-04 ENCOUNTER — Ambulatory Visit: Payer: BLUE CROSS/BLUE SHIELD | Admitting: Pharmacist Clinician (PhC)/ Clinical Pharmacy Specialist

## 2015-11-05 ENCOUNTER — Ambulatory Visit (INDEPENDENT_AMBULATORY_CARE_PROVIDER_SITE_OTHER): Payer: BLUE CROSS/BLUE SHIELD | Admitting: Pharmacist Clinician (PhC)/ Clinical Pharmacy Specialist

## 2015-11-05 DIAGNOSIS — Z7901 Long term (current) use of anticoagulants: Secondary | ICD-10-CM

## 2015-11-05 DIAGNOSIS — I82409 Acute embolism and thrombosis of unspecified deep veins of unspecified lower extremity: Secondary | ICD-10-CM | POA: Diagnosis not present

## 2015-11-05 LAB — POCT INR: INR: 3.8

## 2015-11-19 ENCOUNTER — Ambulatory Visit: Payer: BLUE CROSS/BLUE SHIELD | Admitting: Pharmacist Clinician (PhC)/ Clinical Pharmacy Specialist

## 2015-11-25 ENCOUNTER — Ambulatory Visit: Payer: BLUE CROSS/BLUE SHIELD | Admitting: Pharmacist Clinician (PhC)/ Clinical Pharmacy Specialist

## 2015-11-28 ENCOUNTER — Ambulatory Visit (INDEPENDENT_AMBULATORY_CARE_PROVIDER_SITE_OTHER): Payer: BLUE CROSS/BLUE SHIELD | Admitting: Pharmacist Clinician (PhC)/ Clinical Pharmacy Specialist

## 2015-11-28 DIAGNOSIS — Z7901 Long term (current) use of anticoagulants: Secondary | ICD-10-CM | POA: Diagnosis not present

## 2015-11-28 DIAGNOSIS — I82409 Acute embolism and thrombosis of unspecified deep veins of unspecified lower extremity: Secondary | ICD-10-CM | POA: Diagnosis not present

## 2015-11-28 LAB — POCT INR: INR: 4.3

## 2015-12-12 ENCOUNTER — Ambulatory Visit (INDEPENDENT_AMBULATORY_CARE_PROVIDER_SITE_OTHER): Payer: BLUE CROSS/BLUE SHIELD | Admitting: Pharmacist Clinician (PhC)/ Clinical Pharmacy Specialist

## 2015-12-12 DIAGNOSIS — Z7901 Long term (current) use of anticoagulants: Secondary | ICD-10-CM | POA: Diagnosis not present

## 2015-12-12 DIAGNOSIS — I82409 Acute embolism and thrombosis of unspecified deep veins of unspecified lower extremity: Secondary | ICD-10-CM

## 2015-12-12 LAB — POCT INR: INR: 2.7

## 2015-12-29 ENCOUNTER — Emergency Department (HOSPITAL_COMMUNITY): Payer: BLUE CROSS/BLUE SHIELD

## 2015-12-29 ENCOUNTER — Emergency Department (HOSPITAL_COMMUNITY)
Admission: EM | Admit: 2015-12-29 | Discharge: 2015-12-29 | Disposition: A | Discharge status: Home / Self Care | Payer: BLUE CROSS/BLUE SHIELD | Attending: Emergency Medicine | Admitting: Emergency Medicine

## 2015-12-29 DIAGNOSIS — Z86711 Personal history of pulmonary embolism: Secondary | ICD-10-CM | POA: Insufficient documentation

## 2015-12-29 DIAGNOSIS — Z7901 Long term (current) use of anticoagulants: Secondary | ICD-10-CM | POA: Insufficient documentation

## 2015-12-29 DIAGNOSIS — Z8679 Personal history of other diseases of the circulatory system: Secondary | ICD-10-CM | POA: Insufficient documentation

## 2015-12-29 DIAGNOSIS — R569 Unspecified convulsions: Secondary | ICD-10-CM | POA: Diagnosis not present

## 2015-12-29 DIAGNOSIS — Z86718 Personal history of other venous thrombosis and embolism: Secondary | ICD-10-CM | POA: Insufficient documentation

## 2015-12-29 DIAGNOSIS — Z87440 Personal history of urinary (tract) infections: Secondary | ICD-10-CM | POA: Diagnosis not present

## 2015-12-29 DIAGNOSIS — W01198A Fall on same level from slipping, tripping and stumbling with subsequent striking against other object, initial encounter: Secondary | ICD-10-CM | POA: Insufficient documentation

## 2015-12-29 DIAGNOSIS — Y998 Other external cause status: Secondary | ICD-10-CM | POA: Insufficient documentation

## 2015-12-29 DIAGNOSIS — Y9389 Activity, other specified: Secondary | ICD-10-CM | POA: Diagnosis not present

## 2015-12-29 DIAGNOSIS — Z79899 Other long term (current) drug therapy: Secondary | ICD-10-CM | POA: Diagnosis not present

## 2015-12-29 DIAGNOSIS — Z043 Encounter for examination and observation following other accident: Secondary | ICD-10-CM | POA: Diagnosis not present

## 2015-12-29 DIAGNOSIS — Z87898 Personal history of other specified conditions: Secondary | ICD-10-CM | POA: Insufficient documentation

## 2015-12-29 DIAGNOSIS — Y9289 Other specified places as the place of occurrence of the external cause: Secondary | ICD-10-CM | POA: Diagnosis not present

## 2015-12-29 DIAGNOSIS — R42 Dizziness and giddiness: Secondary | ICD-10-CM | POA: Diagnosis not present

## 2015-12-29 DIAGNOSIS — W19XXXA Unspecified fall, initial encounter: Secondary | ICD-10-CM

## 2015-12-29 LAB — I-STAT CHEM 8, ED
BUN: 18 mg/dL (ref 6–20)
Calcium, Ion: 1.26 mmol/L (ref 1.13–1.30)
Chloride: 107 mmol/L (ref 101–111)
Creatinine, Ser: 0.8 mg/dL (ref 0.44–1.00)
Glucose, Bld: 97 mg/dL (ref 65–99)
HEMATOCRIT: 38 % (ref 36.0–46.0)
HEMOGLOBIN: 12.9 g/dL (ref 12.0–15.0)
Potassium: 4.2 mmol/L (ref 3.5–5.1)
SODIUM: 140 mmol/L (ref 135–145)
TCO2: 21 mmol/L (ref 0–100)

## 2015-12-29 LAB — CBC
HEMATOCRIT: 36.6 % (ref 36.0–46.0)
Hemoglobin: 11.6 g/dL — ABNORMAL LOW (ref 12.0–15.0)
MCH: 27.2 pg (ref 26.0–34.0)
MCHC: 31.7 g/dL (ref 30.0–36.0)
MCV: 85.7 fL (ref 78.0–100.0)
Platelets: 211 10*3/uL (ref 150–400)
RBC: 4.27 MIL/uL (ref 3.87–5.11)
RDW: 14.5 % (ref 11.5–15.5)
WBC: 4.3 10*3/uL (ref 4.0–10.5)

## 2015-12-29 LAB — DIFFERENTIAL
BASOS ABS: 0.1 10*3/uL (ref 0.0–0.1)
BASOS PCT: 2 %
EOS ABS: 0.1 10*3/uL (ref 0.0–0.7)
Eosinophils Relative: 2 %
Lymphocytes Relative: 37 %
Lymphs Abs: 1.6 10*3/uL (ref 0.7–4.0)
MONO ABS: 0.4 10*3/uL (ref 0.1–1.0)
MONOS PCT: 9 %
Neutro Abs: 2.1 10*3/uL (ref 1.7–7.7)
Neutrophils Relative %: 50 %

## 2015-12-29 LAB — COMPREHENSIVE METABOLIC PANEL
ALT: 11 U/L — ABNORMAL LOW (ref 14–54)
AST: 30 U/L (ref 15–41)
Albumin: 3.6 g/dL (ref 3.5–5.0)
Alkaline Phosphatase: 108 U/L (ref 38–126)
Anion gap: 11 (ref 5–15)
BUN: 11 mg/dL (ref 6–20)
CHLORIDE: 106 mmol/L (ref 101–111)
CO2: 23 mmol/L (ref 22–32)
Calcium: 9.6 mg/dL (ref 8.9–10.3)
Creatinine, Ser: 0.87 mg/dL (ref 0.44–1.00)
GFR calc non Af Amer: >60 mL/min (ref >60)
Glucose, Bld: 106 mg/dL — ABNORMAL HIGH (ref 65–99)
POTASSIUM: 4.2 mmol/L (ref 3.5–5.1)
Sodium: 140 mmol/L (ref 135–145)
Total Bilirubin: 0.2 mg/dL — ABNORMAL LOW (ref 0.3–1.2)
Total Protein: 7 g/dL (ref 6.5–8.1)

## 2015-12-29 LAB — I-STAT TROPONIN, ED: TROPONIN I, POC: 0 ng/mL (ref 0.00–0.08)

## 2015-12-29 LAB — CBG MONITORING, ED: Glucose-Capillary: 81 mg/dL (ref 65–99)

## 2015-12-29 LAB — PROTIME-INR
INR: 3.1 — ABNORMAL HIGH (ref 0.00–1.49)
PROTHROMBIN TIME: 31.4 s — AB (ref 11.6–15.2)

## 2015-12-29 LAB — APTT: APTT: 34 s (ref 24–37)

## 2015-12-29 MED ORDER — GADOBENATE DIMEGLUMINE 529 MG/ML IV SOLN
15.0000 mL | Freq: Once | INTRAVENOUS | Status: AC | PRN
  Administered 2015-12-29: 15 mL via INTRAVENOUS

## 2015-12-29 NOTE — ED Notes (Signed)
Pt still in MRI 

## 2015-12-29 NOTE — ED Notes (Signed)
Pt c/o dizziness onset this am @ 9am, pt reported by family to have difficulty speaking witnessed by husband with bil arm weakness, pt hx of epilespy, pt reported LSN @ 9am while getting ready for church, pt reported to have a similar gaze when she has a seizure, last seizure 13 yrs ago, no slurred speech or facial droop at this time, pt continues to report bil weakness, A&O x4, pt family reports similar activity when she has seizures

## 2015-12-29 NOTE — ED Provider Notes (Addendum)
4:10 PM patient is asymptomatic alert Glasgow Coma Score 15. Dr. Leonel Ramsay has evaluate patient feels she can be discharged increase Topamax to 75 mill grams twice a day. Follow-up with neurologist in [place], [place] area she is alert and annulus without difficulty on discharge. Results for orders placed or performed during the hospital encounter of 12/29/15  Protime-INR  Result Value Ref Range   Prothrombin Time 31.4 (H) 11.6 - 15.2 seconds   INR 3.10 (H) 0.00 - 1.49  APTT  Result Value Ref Range   aPTT 34 24 - 37 seconds  CBC  Result Value Ref Range   WBC 4.3 4.0 - 10.5 K/uL   RBC 4.27 3.87 - 5.11 MIL/uL   Hemoglobin 11.6 (L) 12.0 - 15.0 g/dL   HCT 36.6 36.0 - 46.0 %   MCV 85.7 78.0 - 100.0 fL   MCH 27.2 26.0 - 34.0 pg   MCHC 31.7 30.0 - 36.0 g/dL   RDW 14.5 11.5 - 15.5 %   Platelets 211 150 - 400 K/uL  Differential  Result Value Ref Range   Neutrophils Relative % 50 %   Neutro Abs 2.1 1.7 - 7.7 K/uL   Lymphocytes Relative 37 %   Lymphs Abs 1.6 0.7 - 4.0 K/uL   Monocytes Relative 9 %   Monocytes Absolute 0.4 0.1 - 1.0 K/uL   Eosinophils Relative 2 %   Eosinophils Absolute 0.1 0.0 - 0.7 K/uL   Basophils Relative 2 %   Basophils Absolute 0.1 0.0 - 0.1 K/uL  Comprehensive metabolic panel  Result Value Ref Range   Sodium 140 135 - 145 mmol/L   Potassium 4.2 3.5 - 5.1 mmol/L   Chloride 106 101 - 111 mmol/L   CO2 23 22 - 32 mmol/L   Glucose, Bld 106 (H) 65 - 99 mg/dL   BUN 11 6 - 20 mg/dL   Creatinine, Ser 0.87 0.44 - 1.00 mg/dL   Calcium 9.6 8.9 - 10.3 mg/dL   Total Protein 7.0 6.5 - 8.1 g/dL   Albumin 3.6 3.5 - 5.0 g/dL   AST 30 15 - 41 U/L   ALT 11 (L) 14 - 54 U/L   Alkaline Phosphatase 108 38 - 126 U/L   Total Bilirubin 0.2 (L) 0.3 - 1.2 mg/dL   GFR calc non Af Amer >60 >60 mL/min   GFR calc Af Amer >60 >60 mL/min   Anion gap 11 5 - 15  I-stat troponin, ED (not at [facility])  Result Value Ref Range   Troponin i, poc 0.00 0.00 - 0.08 ng/mL   Comment 3          CBG  monitoring, ED  Result Value Ref Range   Glucose-Capillary 81 65 - 99 mg/dL  I-Stat Chem 8, ED  (not at [facility])  Result Value Ref Range   Sodium 140 135 - 145 mmol/L   Potassium 4.2 3.5 - 5.1 mmol/L   Chloride 107 101 - 111 mmol/L   BUN 18 6 - 20 mg/dL   Creatinine, Ser 0.80 0.44 - 1.00 mg/dL   Glucose, Bld 97 65 - 99 mg/dL   Calcium, Ion 1.26 1.13 - 1.30 mmol/L   TCO2 21 0 - 100 mmol/L   Hemoglobin 12.9 12.0 - 15.0 g/dL   HCT 38.0 36.0 - 46.0 %   Ct Head Wo Contrast  12/29/2015  CLINICAL DATA:  Code stroke, became dizzy at 900 hours and fell backwards down steps striking back of head, headache, BILATERAL upper extremity weakness,  history seizures, pulmonary embolism EXAM: CT HEAD WITHOUT CONTRAST TECHNIQUE: Contiguous axial images were obtained from the base of the skull through the vertex without intravenous contrast. COMPARISON:  08/02/2013 FINDINGS: Diffuse dilatation of the ventricular system similar to the previous exam, out of proportion to the degree of cortical atrophy, question normal pressure hydrocephalus. No midline shift or mass effect. Otherwise normal appearance of brain parenchyma. No intracranial hemorrhage, mass lesion, or evidence acute infarction. No definite extra-axial collections. Few streak artifacts of calvarial origin along the lateral margins the temporal lobes bilaterally. Atherosclerotic calcifications at the carotid siphons bilaterally. Bones and sinuses unremarkable. IMPRESSION: Persistent dilatation of the ventricular system similar to previous exam question normal pressure hydrocephalus. No acute intracranial abnormalities. Findings called to Dr. Leonel Ramsay on 12/29/2015 at 1039 hours. Electronically Signed   By: Lavonia Dana M.D.   On: 12/29/2015 10:39   Ct Cervical Spine Wo Contrast  12/29/2015  CLINICAL DATA:  Became dizzy and fell backwards down stairs striking back of head, BILATERAL upper extremity weakness EXAM: CT CERVICAL SPINE WITHOUT CONTRAST  TECHNIQUE: Multidetector CT imaging of the cervical spine was performed without intravenous contrast. Multiplanar CT image reconstructions were also generated. COMPARISON:  08/02/2013 FINDINGS: Prevertebral soft tissues normal thickness. Bones appear demineralized. Disc space narrowing C3-C4 and C5-C6. Vertebral body heights maintained without fracture or subluxation. Scattered minimal degenerative facet disease changes. Visualized skullbase intact. Extensive atherosclerotic calcifications at the carotid bifurcations. Lung apices clear. IMPRESSION: Degenerative disc disease changes of the cervical spine greatest at C3-C4 and C5-C6. No definite acute cervical spine abnormalities. Electronically Signed   By: Lavonia Dana M.D.   On: 12/29/2015 10:44   Mr Jodene Nam Head Wo Contrast  12/29/2015  CLINICAL DATA:  Pt c/o dizziness onset this am @ 9 am, pt reported by family to have difficulty speaking witnessed by husband with bil arm weakness, pt hx of epilepsy, pt reported LSN @ 9 am while getting ready for church. EXAM: MRI HEAD WITHOUT AND WITH CONTRAST AND MRA HEAD WITHOUT AND WITH CONTRAST AND MRI NECK WITHOUT AND WITH CONTRAST TECHNIQUE: Multiplanar, multiecho pulse sequences of the brain and surrounding structures were obtained without and with intravenous contrast. Angiographic images of the head were obtained using MRA technique without and with contrast. Multiplanar, multiecho pulse sequences of the neck and surrounding structures were obtained without and with intravenous contrast. CONTRAST:  6mL MULTIHANCE GADOBENATE DIMEGLUMINE 529 MG/ML IV SOLN COMPARISON:  CT head and cervical spine 12/29/2015. Also head CT 08/02/2013. FINDINGS: MRI HEAD FINDINGS No evidence for acute stroke, acute hemorrhage, mass lesion, or extra-axial fluid. Marked prominence of the lateral ventricles, and third ventricle, with mildly enlarged fourth ventricle, out of proportion to the degree of cortical atrophy or brain substance loss,  could signify normal pressure hydrocephalus. There is not florid transependymal absorption, but there may not be, given that the ventricles of been dilated for 2.5 years based on review of prior 2014 head CT. There are focal areas of subcortical white matter signal abnormality, favored to represent chronic microvascular ischemic change. Flow voids are maintained throughout the carotid, basilar, and vertebral arteries. There are no areas of chronic hemorrhage. Normal pituitary and cerebellar tonsils.  Mild cervical spondylosis. MRA HEAD FINDINGS The internal carotid arteries are widely patent. The basilar artery is widely patent with vertebrals codominant. Fetal origin RIGHT PCA. No intracranial stenosis or visible aneurysm. MRI NECK FINDINGS Unremarkable transverse arch. Conventional branching of the great vessels without proximal stenosis. No subclavian lesions. Carotid bifurcations are free of  disease. No stenosis, ulceration, dissection, or fibromuscular changes. Both vertebral arteries are robust, without ostial stenosis or irregularity in the neck. They contribute equally to the formation of the basilar. IMPRESSION: No acute stroke or intracranial mass lesion. Ventriculomegaly, out of proportion to the degree of cortical atrophy, suggesting normal pressure hydrocephalus. Similar appearance was seen in 2014. No extracranial or intracranial stenosis or large vessel occlusion. Electronically Signed   By: Staci Righter M.D.   On: 12/29/2015 14:48   Mr Angiogram Neck W Wo Contrast  12/29/2015  CLINICAL DATA:  Pt c/o dizziness onset this am @ 9 am, pt reported by family to have difficulty speaking witnessed by husband with bil arm weakness, pt hx of epilepsy, pt reported LSN @ 9 am while getting ready for church. EXAM: MRI HEAD WITHOUT AND WITH CONTRAST AND MRA HEAD WITHOUT AND WITH CONTRAST AND MRI NECK WITHOUT AND WITH CONTRAST TECHNIQUE: Multiplanar, multiecho pulse sequences of the brain and surrounding  structures were obtained without and with intravenous contrast. Angiographic images of the head were obtained using MRA technique without and with contrast. Multiplanar, multiecho pulse sequences of the neck and surrounding structures were obtained without and with intravenous contrast. CONTRAST:  25mL MULTIHANCE GADOBENATE DIMEGLUMINE 529 MG/ML IV SOLN COMPARISON:  CT head and cervical spine 12/29/2015. Also head CT 08/02/2013. FINDINGS: MRI HEAD FINDINGS No evidence for acute stroke, acute hemorrhage, mass lesion, or extra-axial fluid. Marked prominence of the lateral ventricles, and third ventricle, with mildly enlarged fourth ventricle, out of proportion to the degree of cortical atrophy or brain substance loss, could signify normal pressure hydrocephalus. There is not florid transependymal absorption, but there may not be, given that the ventricles of been dilated for 2.5 years based on review of prior 2014 head CT. There are focal areas of subcortical white matter signal abnormality, favored to represent chronic microvascular ischemic change. Flow voids are maintained throughout the carotid, basilar, and vertebral arteries. There are no areas of chronic hemorrhage. Normal pituitary and cerebellar tonsils.  Mild cervical spondylosis. MRA HEAD FINDINGS The internal carotid arteries are widely patent. The basilar artery is widely patent with vertebrals codominant. Fetal origin RIGHT PCA. No intracranial stenosis or visible aneurysm. MRI NECK FINDINGS Unremarkable transverse arch. Conventional branching of the great vessels without proximal stenosis. No subclavian lesions. Carotid bifurcations are free of disease. No stenosis, ulceration, dissection, or fibromuscular changes. Both vertebral arteries are robust, without ostial stenosis or irregularity in the neck. They contribute equally to the formation of the basilar. IMPRESSION: No acute stroke or intracranial mass lesion. Ventriculomegaly, out of proportion to  the degree of cortical atrophy, suggesting normal pressure hydrocephalus. Similar appearance was seen in 2014. No extracranial or intracranial stenosis or large vessel occlusion. Electronically Signed   By: Staci Righter M.D.   On: 12/29/2015 14:48   Mr Brain Wo Contrast  12/29/2015  CLINICAL DATA:  Pt c/o dizziness onset this am @ 9 am, pt reported by family to have difficulty speaking witnessed by husband with bil arm weakness, pt hx of epilepsy, pt reported LSN @ 9 am while getting ready for church. EXAM: MRI HEAD WITHOUT AND WITH CONTRAST AND MRA HEAD WITHOUT AND WITH CONTRAST AND MRI NECK WITHOUT AND WITH CONTRAST TECHNIQUE: Multiplanar, multiecho pulse sequences of the brain and surrounding structures were obtained without and with intravenous contrast. Angiographic images of the head were obtained using MRA technique without and with contrast. Multiplanar, multiecho pulse sequences of the neck and surrounding structures were obtained without and  with intravenous contrast. CONTRAST:  28mL MULTIHANCE GADOBENATE DIMEGLUMINE 529 MG/ML IV SOLN COMPARISON:  CT head and cervical spine 12/29/2015. Also head CT 08/02/2013. FINDINGS: MRI HEAD FINDINGS No evidence for acute stroke, acute hemorrhage, mass lesion, or extra-axial fluid. Marked prominence of the lateral ventricles, and third ventricle, with mildly enlarged fourth ventricle, out of proportion to the degree of cortical atrophy or brain substance loss, could signify normal pressure hydrocephalus. There is not florid transependymal absorption, but there may not be, given that the ventricles of been dilated for 2.5 years based on review of prior 2014 head CT. There are focal areas of subcortical white matter signal abnormality, favored to represent chronic microvascular ischemic change. Flow voids are maintained throughout the carotid, basilar, and vertebral arteries. There are no areas of chronic hemorrhage. Normal pituitary and cerebellar tonsils.  Mild  cervical spondylosis. MRA HEAD FINDINGS The internal carotid arteries are widely patent. The basilar artery is widely patent with vertebrals codominant. Fetal origin RIGHT PCA. No intracranial stenosis or visible aneurysm. MRI NECK FINDINGS Unremarkable transverse arch. Conventional branching of the great vessels without proximal stenosis. No subclavian lesions. Carotid bifurcations are free of disease. No stenosis, ulceration, dissection, or fibromuscular changes. Both vertebral arteries are robust, without ostial stenosis or irregularity in the neck. They contribute equally to the formation of the basilar. IMPRESSION: No acute stroke or intracranial mass lesion. Ventriculomegaly, out of proportion to the degree of cortical atrophy, suggesting normal pressure hydrocephalus. Similar appearance was seen in 2014. No extracranial or intracranial stenosis or large vessel occlusion. Electronically Signed   By: Staci Righter M.D.   On: 12/29/2015 14:48     Orlie Dakin, MD 12/29/15 [place], MD 12/29/15 [phone]

## 2015-12-29 NOTE — ED Provider Notes (Signed)
CSN: [id number]     Arrival date & time 12/29/15  [id number] History   First MD Initiated Contact with Patient 12/29/15 1014     Chief Complaint  Patient presents with  . Dizziness     (Consider location/radiation/quality/duration/timing/severity/associated sxs/prior Treatment) Patient is a 70 y.o. female presenting with dizziness. The history is provided by the patient and a relative (The patient states that she had an episode this morning where she felt dizzy and her husband said she couldn't get the words out [employer]. She's had a history of seizures before).  Dizziness Quality:  Imbalance Severity:  Moderate Onset quality:  Sudden Timing:  Constant Progression:  Waxing and waning Chronicity:  New Context: not when bending over   Associated symptoms: no chest pain, no diarrhea and no headaches     Past Medical History  Diagnosis Date  . Seizures   . 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 PE (pulmonary embolism) 2005  . History of echocardiogram 03/11/09    nl EF 60-65%  . Peripheral vascular disease     dvt leg rt  . History of recurrent UTIs     feels like may have one now  . [id number])    Past Surgical History  Procedure Laterality Date  . Knee surgery Left 96    kneecap fx  . Insertion of vena cava filter  10  . Orif humerus fracture Right 08/01/2013    Procedure: NON-UNION REPAIR RIGHT PROXIMAL HUMERUS FRACTURE;  Surgeon: Rozanna Box, MD;  Location: [place];  Service: Orthopedics;  Laterality: Right;   Family History  Problem Relation Age of Onset  . Diabetes Mother   . Stroke Father    Social History  Substance Use Topics  . Smoking status: Never Smoker   . Smokeless tobacco: Not on file  . Alcohol Use: No   OB History    No data available     Review of Systems  Constitutional: Negative for appetite change  and fatigue.  HENT: Negative for congestion, ear discharge and sinus pressure.   Eyes: Negative for discharge.  Respiratory: Negative for cough.   Cardiovascular: Negative for chest pain.  Gastrointestinal: Negative for abdominal pain and diarrhea.  Genitourinary: Negative for frequency and hematuria.  Musculoskeletal: Negative for back pain.  Skin: Negative for rash.  Neurological: Positive for dizziness and speech difficulty. Negative for seizures and headaches.  Psychiatric/Behavioral: Negative for hallucinations.      Allergies  Tegretol  Home Medications   Prior to Admission medications   Medication Sig Start Date End Date Taking? Authorizing Provider  acetaminophen (TYLENOL) 500 MG tablet Take 500 mg by mouth every 6 (six) hours as needed.   Yes Historical Provider, MD  Calcium Citrate-Vitamin D (CALCIUM CITRATE + D PO) Take 1 tablet by mouth 2 (two) times daily with a meal.   Yes Historical Provider, MD  cetirizine (ZYRTEC) 10 MG tablet Take 10 mg by mouth daily.   Yes Historical Provider, MD  Coenzyme Q10 50 MG CAPS Take 1 capsule by mouth every morning.   Yes Historical Provider, MD  Glucosamine-Chondroitin (GLUCOSAMINE CHONDR COMPLEX PO) Take 1 tablet by mouth 2 (two) times daily with a meal.   Yes Historical Provider, MD  HYDROcodone-acetaminophen (NORCO/VICODIN) 5-325 MG tablet Take 1 tablet by mouth every 6 (six) hours as needed for  moderate pain.   Yes Historical Provider, MD  lamoTRIgine (LAMICTAL) 100 MG tablet Take 50 mg by mouth 2 (two) times daily. Takes with 200mg  tablets for a total dose of 250mg  twice a day   Yes Historical Provider, MD  lamoTRIgine (LAMICTAL) 200 MG tablet Take by mouth 2 (two) times daily. Takes with 50mg  tablets for a total dose of 250mg  twice a day   Yes Historical Provider, MD  mirtazapine (REMERON) 7.5 MG tablet Take 0.5 tablets by mouth at bedtime. 04/26/15  Yes Historical Provider, MD  Multiple Vitamin (MULTIVITAMIN WITH MINERALS) TABS  tablet Take 1 tablet by mouth 2 (two) times daily with a meal.   Yes Historical Provider, MD  Omega-3 Fatty Acids (OMEGA 3 PO) Take 1 capsule by mouth every morning.   Yes Historical Provider, MD  topiramate (TOPAMAX) 25 MG tablet Take 50-75 mg by mouth 2 (two) times daily. 3 tablets in the morning; 2 tablets in the evening   Yes Historical Provider, MD  warfarin (COUMADIN) 7.5 MG tablet TAKE 1&1/2 TO 2 TABLETS BY MOUTH DAILY AS DIRECTED Patient taking differently: 7.5 mg. TAKE 1 and 1/2 tablets on sundays then take one tablet Tuesday through Saturday as directed by vascular clinic. 08/21/15  Yes [name], MD  enoxaparin (LOVENOX) 60 MG/0.6ML injection Inject 0.6 mLs (60 mg total) into the skin every 12 (twelve) hours. 08/03/14   [name], MD   BP 118/57 mmHg  Pulse 76  Temp(Src) 97.7 F (36.5 C) (Oral)  Resp 19  Ht 5\' 6"  (1.676 m)  Wt 145 lb (65.772 kg)  BMI 23.41 kg/m2  SpO2 100% Physical Exam  Constitutional: She is oriented to person, place, and time. She appears well-developed.  HENT:  Head: Normocephalic.  Eyes: Conjunctivae and EOM are normal. No scleral icterus.  Neck: Neck supple. No thyromegaly present.  Cardiovascular: Normal rate and regular rhythm.  Exam reveals no gallop and no friction rub.   No murmur heard. Pulmonary/Chest: No stridor. She has no wheezes. She has no rales. She exhibits no tenderness.  Abdominal: She exhibits no distension. There is no tenderness. There is no rebound.  Musculoskeletal: Normal range of motion. She exhibits no edema.  Lymphadenopathy:    She has no cervical adenopathy.  Neurological: She is oriented to person, place, and time. She exhibits normal muscle tone. Coordination normal.  Skin: No rash noted. No erythema.  Psychiatric: She has a normal mood and affect. Her behavior is normal.    ED Course  Procedures (including critical care time) Labs Review Labs Reviewed  PROTIME-INR - Abnormal; Notable for the following:     Prothrombin Time 31.4 (*)    INR 3.10 (*)    All other components within normal limits  CBC - Abnormal; Notable for the following:    Hemoglobin 11.6 (*)    All other components within normal limits  COMPREHENSIVE METABOLIC PANEL - Abnormal; Notable for the following:    Glucose, Bld 106 (*)    ALT 11 (*)    Total Bilirubin 0.2 (*)    All other components within normal limits  APTT  DIFFERENTIAL  I-STAT TROPOININ, ED  CBG MONITORING, ED  I-STAT CHEM 8, ED    Imaging Review Ct Head Wo Contrast  12/29/2015  CLINICAL DATA:  Code stroke, became dizzy at 900 hours and fell backwards down steps striking back of head, headache, BILATERAL upper extremity weakness, history seizures, pulmonary embolism EXAM: CT HEAD WITHOUT CONTRAST TECHNIQUE: Contiguous axial images were  obtained from the base of the skull through the vertex without intravenous contrast. COMPARISON:  08/02/2013 FINDINGS: Diffuse dilatation of the ventricular system similar to the previous exam, out of proportion to the degree of cortical atrophy, question normal pressure hydrocephalus. No midline shift or mass effect. Otherwise normal appearance of brain parenchyma. No intracranial hemorrhage, mass lesion, or evidence acute infarction. No definite extra-axial collections. Few streak artifacts of calvarial origin along the lateral margins the temporal lobes bilaterally. Atherosclerotic calcifications at the carotid siphons bilaterally. Bones and sinuses unremarkable. IMPRESSION: Persistent dilatation of the ventricular system similar to previous exam question normal pressure hydrocephalus. No acute intracranial abnormalities. Findings called to Dr. Leonel Ramsay on 12/29/2015 at 1039 hours. Electronically Signed   By: Lavonia Dana M.D.   On: 12/29/2015 10:39   Ct Cervical Spine Wo Contrast  12/29/2015  CLINICAL DATA:  Became dizzy and fell backwards down stairs striking back of head, BILATERAL upper extremity weakness EXAM: CT CERVICAL  SPINE WITHOUT CONTRAST TECHNIQUE: Multidetector CT imaging of the cervical spine was performed without intravenous contrast. Multiplanar CT image reconstructions were also generated. COMPARISON:  08/02/2013 FINDINGS: Prevertebral soft tissues normal thickness. Bones appear demineralized. Disc space narrowing C3-C4 and C5-C6. Vertebral body heights maintained without fracture or subluxation. Scattered minimal degenerative facet disease changes. Visualized skullbase intact. Extensive atherosclerotic calcifications at the carotid bifurcations. Lung apices clear. IMPRESSION: Degenerative disc disease changes of the cervical spine greatest at C3-C4 and C5-C6. No definite acute cervical spine abnormalities. Electronically Signed   By: Lavonia Dana M.D.   On: 12/29/2015 10:44   I have personally reviewed and evaluated these images and lab results as part of my medical decision-making.   EKG Interpretation   Date/Time:  Sunday December 29 2015 09:31:36 EST Ventricular Rate:  82 PR Interval:  160 QRS Duration: 82 QT Interval:  364 QTC Calculation: 425 R Axis:   51 Text Interpretation:  Normal sinus rhythm Normal ECG Confirmed by Nance Mccombs   MD, Jeanetta Alonzo [phone]) on 12/29/2015 2:09:51 PM      MDM   Final diagnoses:  Fall  Dizziness  Dizziness  Dizziness    Patient with some slurred speech and some dizziness. Labs unremarkable patient in getting an MRI neurology will decide on disposition after an MRI    Milton Ferguson, MD 12/29/15 [phone]

## 2015-12-29 NOTE — ED Notes (Signed)
Pt updated and pt given crackers and sandwich

## 2015-12-29 NOTE — ED Notes (Signed)
Melissa, RN made aware of calling a code stroke

## 2015-12-29 NOTE — Discharge Instructions (Signed)
Dizziness Increase your dose of Topamax to 75 mg twice daily. Contact your neurologist in [place] tomorrow to let him/her know that you were here today. He/she can gain access to your records here, and advise you whether or not you need to be seen in the office before your scheduled appointment next month. Return if concerned for any reason. Dizziness is a common problem. It is a feeling of unsteadiness or light-headedness. You may feel like you are about to faint. Dizziness can lead to injury if you stumble or fall. Anyone can become dizzy, but dizziness is more common in older adults. This condition can be caused by a number of things, including medicines, dehydration, or illness. HOME CARE INSTRUCTIONS Taking these steps may help with your condition: Eating and Drinking  Drink enough fluid to keep your urine clear or pale yellow. This helps to keep you from becoming dehydrated. Try to drink more clear fluids, such as water.  Do not drink alcohol.  Limit your caffeine intake if directed by your health care provider.  Limit your salt intake if directed by your health care provider. Activity  Avoid making quick movements.  Rise slowly from chairs and steady yourself until you feel okay.  In the morning, first sit up on the side of the bed. When you feel okay, stand slowly while you hold onto something until you know that your balance is fine.  Move your legs often if you need to stand in one place for a long time. Tighten and relax your muscles in your legs while you are standing.  Do not drive or operate heavy machinery if you feel dizzy.  Avoid bending down if you feel dizzy. Place items in your home so that they are easy for you to reach without leaning over. Lifestyle  Do not use any tobacco products, including cigarettes, chewing tobacco, or electronic cigarettes. If you need help quitting, ask your health care provider.  Try to reduce your stress level, such as  with yoga or meditation. Talk with your health care provider if you need help. General Instructions  Watch your dizziness for any changes.  Take medicines only as directed by your health care provider. Talk with your health care provider if you think that your dizziness is caused by a medicine that you are taking.  Tell a friend or a family member that you are feeling dizzy. If he or she notices any changes in your behavior, have this person call your health care provider.  Keep all follow-up visits as directed by your health care provider. This is important. SEEK MEDICAL CARE IF:  Your dizziness does not go away.  Your dizziness or light-headedness gets worse.  You feel nauseous.  You have reduced hearing.  You have new symptoms.  You are unsteady on your feet or you feel like the room is spinning. SEEK IMMEDIATE MEDICAL CARE IF:  You vomit or have diarrhea and are unable to eat or drink anything.  You have problems talking, walking, swallowing, or using your arms, hands, or legs.  You feel generally weak.  You are not thinking clearly or you have trouble forming sentences. It may take a friend or family member to notice this.  You have chest pain, abdominal pain, shortness of breath, or sweating.  Your vision changes.  You notice any bleeding.  You have a headache.  You have neck pain or a stiff neck.  You have a fever.   This information is not intended  to replace advice given to you by your health care provider. Make sure you discuss any questions you have with your health care provider.   Document Released: 05/26/2001 Document Revised: 04/16/2015 Document Reviewed: 11/26/2014 Elsevier Interactive Patient Education [employer].

## 2015-12-29 NOTE — ED Notes (Signed)
Notified Dr. Leonel Ramsay that pt's MRI is back. MD will look at scans and adjust meds accordingly.

## 2015-12-29 NOTE — Consult Note (Signed)
Neurology Consultation Reason for Consult: Fall Referring Physician: Roderic Palau,   CC: Fall  History is obtained from: Patient, husband  HPI: Evelyn [name] is a 70 y.o. female who is in her normal state of health this morning when she suddenly became "dizzy" and tried to sit down on the bed. She reached for the bedpost, however her arms bilaterally raised without her intending to raise her arms. They did of their own accord. This caused her to not be able to reach the bed post. Her husband found her in a dazed appearing state, though she was still able to speak. Her husband states that this had the appearance that she used to get with her seizures. She did not have any period of complete loss of consciousness. She then walked to the door to come to the hospital, however while walking down the stairs she did become dizzy and fell hitting her head. She did not lose consciousness with this either. Since that time, her symptoms have steadily improved and she is currently back to her baseline. She felt like her legs were weak bilaterally.  She has not had a seizure in approximately 13 years. She was weaned from primidone 1 year ago. She used to get a very characteristic aura prior to her seizures and did not get it with this episode. She is also not certain if she has ever had a partial seizure with preserved consciousness before.   ROS: A 14 point ROS was performed and is negative except as noted in the HPI.   Past Medical History  Diagnosis Date  . Seizures   . 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  . PE (pulmonary embolism) 2005  . History of echocardiogram 03/11/09    nl EF 60-65%  . Peripheral vascular disease     dvt leg rt  . History of recurrent UTIs     feels like may have one now  . Headache(784.0)      Family History  Problem Relation Age of  Onset  . Diabetes Mother   . Stroke Father      Social History:  reports that she has never smoked. She does not have any smokeless tobacco history on file. She reports that she does not drink alcohol or use illicit drugs.   Exam: Current vital signs: BP 110/60 mmHg  Pulse 79  Temp(Src) 97.7 F (36.5 C) (Oral)  Resp 18  Ht 5\' 6"  (1.676 m)  Wt 65.772 kg (145 lb)  BMI 23.41 kg/m2  SpO2 100% Vital signs in last 24 hours: Temp:  [97.7 F (36.5 C)] 97.7 F (36.5 C) (01/15 0932) Pulse Rate:  [73-91] 79 (01/15 1616) Resp:  [15-19] 18 (01/15 1616) BP: (110-135)/(57-87) 110/60 mmHg (01/15 1616) SpO2:  [100 %] 100 % (01/15 1616) Weight:  [65.772 kg (145 lb)] 65.772 kg (145 lb) (01/15 0932)   Physical Exam  Constitutional: Appears well-developed and well-nourished.  Psych: Affect appropriate to situation Eyes: No scleral injection HENT: No OP obstrucion Head: Normocephalic.  Cardiovascular: Normal rate and regular rhythm.  Respiratory: Effort normal and breath sounds normal to anterior ascultation GI: Soft.  No distension. There is no tenderness.  Skin: WDI  Neuro: Mental Status: Patient is awake, alert, oriented to person, place, month, year, and situation. Patient is able to give a clear and coherent history. No signs  of aphasia or neglect Cranial Nerves: II: Visual Fields are full. Pupils are equal, round, and reactive to light.   III,IV, VI: EOMI without ptosis or diploplia.  V: Facial sensation is symmetric to temperature VII: Facial movement is symmetric.  VIII: hearing is intact to voice X: Uvula elevates symmetrically XI: Shoulder shrug is symmetric. XII: tongue is midline without atrophy or fasciculations.  Motor: Tone is normal. Bulk is normal. 5/5 strength was present in all four extremities.  Sensory: Sensation is symmetric to light touch and temperature in the arms and legs. Deep Tendon Reflexes: 2+ and symmetric in the biceps and patellae.   Plantars: Toes are downgoing bilaterally.  Cerebellar: FNF and HKS are intact bilaterally         I have reviewed labs in epic and the results pertinent to this consultation are: CMP, CBC-unremarkable  I have reviewed the images obtained: CT head-dilated ventricles unchanged from 2014  Impression: 70 year old female with transient episode of involuntary movement, confusion, dizziness. The involuntary movement aspect of it as well as bilateral symptoms make me wonder if this was supplementary motor seizure which does involve bilateral arm raising as the patient describes. Less likely, this could have represented TIA. In that case, she is already on anticoagulation and I would not add antiplatelet therapy. She will need vascular imaging, however if this is negative then I think she can follow-up for checking her lipids and A1c. Again, I have low suspicion that this represented TIA.  Recommendations: 1) continue Coumadin, borderline high and patient should discuss this with her PCP 2) increase Topamax to 75 mg twice a day 3) continue Lamictal 250 twice a day 4) MRI brain with MRA head and neck 5) if imaging is negative, then patient can follow up as an outpatient with her neurologist. I informed her not to drive until cleared by a neurologist or 6 months from the most recent episode.   Roland Rack, MD [facility] [phone]  If 7pm- 7am, please page neurology on call as listed in [place].

## 2015-12-29 NOTE — Significant Event (Signed)
Rapid Response Event Note  Overview:  Called for Code Stroke.    Initial Focused Assessment: MD at bedside, stroke assessment underway.  Interventions: Assisted with evaluation, charting and explanations of possibilities to family and patient.   Event Summary:   at      at          Baron Hamper

## 2015-12-29 NOTE — ED Notes (Signed)
Pt in MRI.

## 2015-12-30 ENCOUNTER — Telehealth: Payer: Self-pay | Admitting: Pharmacist Clinician (PhC)/ Clinical Pharmacy Specialist

## 2015-12-30 NOTE — Telephone Encounter (Signed)
Pt LMOM stating INR in ER yesterday was 3.1  Reviewed ER notes and returned call to patient.  Advised she take only 1/2 tablet today Monday Jan 16 then continue with normal routine.  She states bruising only from IV access sites, none from fall on stairs.  Advised that she can keep appt for Friday this week or move to next week.  She will review her schedule and decide.  Appt left until we hear from her.

## 2015-12-31 ENCOUNTER — Encounter: Payer: BLUE CROSS/BLUE SHIELD | Admitting: Pharmacist Clinician (PhC)/ Clinical Pharmacy Specialist

## 2016-01-03 ENCOUNTER — Encounter: Payer: BLUE CROSS/BLUE SHIELD | Admitting: Pharmacist Clinician (PhC)/ Clinical Pharmacy Specialist

## 2016-01-07 ENCOUNTER — Ambulatory Visit (INDEPENDENT_AMBULATORY_CARE_PROVIDER_SITE_OTHER): Payer: BLUE CROSS/BLUE SHIELD | Admitting: Pharmacist Clinician (PhC)/ Clinical Pharmacy Specialist

## 2016-01-07 DIAGNOSIS — I82409 Acute embolism and thrombosis of unspecified deep veins of unspecified lower extremity: Secondary | ICD-10-CM

## 2016-01-07 DIAGNOSIS — Z7901 Long term (current) use of anticoagulants: Secondary | ICD-10-CM

## 2016-01-07 LAB — POCT INR: INR: 2.8

## 2016-02-04 ENCOUNTER — Ambulatory Visit (INDEPENDENT_AMBULATORY_CARE_PROVIDER_SITE_OTHER): Payer: BLUE CROSS/BLUE SHIELD | Admitting: Pharmacist Clinician (PhC)/ Clinical Pharmacy Specialist

## 2016-02-04 DIAGNOSIS — Z7901 Long term (current) use of anticoagulants: Secondary | ICD-10-CM | POA: Diagnosis not present

## 2016-02-04 DIAGNOSIS — I82409 Acute embolism and thrombosis of unspecified deep veins of unspecified lower extremity: Secondary | ICD-10-CM | POA: Diagnosis not present

## 2016-02-04 LAB — POCT INR: INR: 3.1

## 2016-02-20 ENCOUNTER — Other Ambulatory Visit: Payer: Self-pay | Admitting: Cardiovascular Disease

## 2016-03-03 ENCOUNTER — Ambulatory Visit (INDEPENDENT_AMBULATORY_CARE_PROVIDER_SITE_OTHER): Payer: BLUE CROSS/BLUE SHIELD | Admitting: Pharmacist Clinician (PhC)/ Clinical Pharmacy Specialist

## 2016-03-03 DIAGNOSIS — I82409 Acute embolism and thrombosis of unspecified deep veins of unspecified lower extremity: Secondary | ICD-10-CM | POA: Diagnosis not present

## 2016-03-03 DIAGNOSIS — Z7901 Long term (current) use of anticoagulants: Secondary | ICD-10-CM

## 2016-03-03 LAB — POCT INR: INR: 3.6

## 2016-03-17 ENCOUNTER — Ambulatory Visit (INDEPENDENT_AMBULATORY_CARE_PROVIDER_SITE_OTHER): Payer: BLUE CROSS/BLUE SHIELD | Admitting: Pharmacist Clinician (PhC)/ Clinical Pharmacy Specialist

## 2016-03-17 DIAGNOSIS — I82409 Acute embolism and thrombosis of unspecified deep veins of unspecified lower extremity: Secondary | ICD-10-CM | POA: Diagnosis not present

## 2016-03-17 DIAGNOSIS — Z7901 Long term (current) use of anticoagulants: Secondary | ICD-10-CM | POA: Diagnosis not present

## 2016-03-17 LAB — POCT INR: INR: 2.2

## 2016-04-10 ENCOUNTER — Ambulatory Visit (INDEPENDENT_AMBULATORY_CARE_PROVIDER_SITE_OTHER): Payer: BLUE CROSS/BLUE SHIELD | Admitting: Pharmacist

## 2016-04-10 DIAGNOSIS — I82409 Acute embolism and thrombosis of unspecified deep veins of unspecified lower extremity: Secondary | ICD-10-CM

## 2016-04-10 DIAGNOSIS — Z7901 Long term (current) use of anticoagulants: Secondary | ICD-10-CM

## 2016-04-10 LAB — POCT INR: INR: 2.9

## 2016-05-08 ENCOUNTER — Ambulatory Visit (INDEPENDENT_AMBULATORY_CARE_PROVIDER_SITE_OTHER): Payer: BLUE CROSS/BLUE SHIELD | Admitting: Pharmacist Clinician (PhC)/ Clinical Pharmacy Specialist

## 2016-05-08 DIAGNOSIS — Z7901 Long term (current) use of anticoagulants: Secondary | ICD-10-CM | POA: Diagnosis not present

## 2016-05-08 DIAGNOSIS — I82409 Acute embolism and thrombosis of unspecified deep veins of unspecified lower extremity: Secondary | ICD-10-CM

## 2016-05-08 LAB — POCT INR: INR: 1.8

## 2016-05-22 ENCOUNTER — Encounter: Payer: Self-pay | Admitting: Cardiovascular Disease

## 2016-05-28 ENCOUNTER — Ambulatory Visit (INDEPENDENT_AMBULATORY_CARE_PROVIDER_SITE_OTHER): Payer: BLUE CROSS/BLUE SHIELD | Admitting: Pharmacist

## 2016-05-28 DIAGNOSIS — I82409 Acute embolism and thrombosis of unspecified deep veins of unspecified lower extremity: Secondary | ICD-10-CM

## 2016-05-28 DIAGNOSIS — Z7901 Long term (current) use of anticoagulants: Secondary | ICD-10-CM

## 2016-05-28 LAB — POCT INR: INR: 2.8

## 2016-05-28 MED ORDER — WARFARIN SODIUM 7.5 MG PO TABS: ORAL_TABLET | ORAL | Status: DC

## 2016-06-23 ENCOUNTER — Ambulatory Visit (INDEPENDENT_AMBULATORY_CARE_PROVIDER_SITE_OTHER): Payer: BLUE CROSS/BLUE SHIELD | Admitting: Pharmacist Clinician (PhC)/ Clinical Pharmacy Specialist

## 2016-06-23 DIAGNOSIS — I82409 Acute embolism and thrombosis of unspecified deep veins of unspecified lower extremity: Secondary | ICD-10-CM

## 2016-06-23 DIAGNOSIS — Z7901 Long term (current) use of anticoagulants: Secondary | ICD-10-CM

## 2016-06-23 LAB — POCT INR: INR: 2.3

## 2016-07-21 ENCOUNTER — Ambulatory Visit (INDEPENDENT_AMBULATORY_CARE_PROVIDER_SITE_OTHER): Payer: BLUE CROSS/BLUE SHIELD | Admitting: Pharmacist

## 2016-07-21 DIAGNOSIS — I82409 Acute embolism and thrombosis of unspecified deep veins of unspecified lower extremity: Secondary | ICD-10-CM

## 2016-07-21 DIAGNOSIS — Z7901 Long term (current) use of anticoagulants: Secondary | ICD-10-CM | POA: Diagnosis not present

## 2016-07-21 LAB — POCT INR: INR: 2.2

## 2016-08-28 ENCOUNTER — Ambulatory Visit (INDEPENDENT_AMBULATORY_CARE_PROVIDER_SITE_OTHER): Payer: BLUE CROSS/BLUE SHIELD | Admitting: Pharmacist Clinician (PhC)/ Clinical Pharmacy Specialist

## 2016-08-28 DIAGNOSIS — Z7901 Long term (current) use of anticoagulants: Secondary | ICD-10-CM | POA: Diagnosis not present

## 2016-08-28 DIAGNOSIS — I82409 Acute embolism and thrombosis of unspecified deep veins of unspecified lower extremity: Secondary | ICD-10-CM | POA: Diagnosis not present

## 2016-08-28 LAB — POCT INR: INR: 3.5

## 2016-09-11 ENCOUNTER — Ambulatory Visit (INDEPENDENT_AMBULATORY_CARE_PROVIDER_SITE_OTHER): Payer: BLUE CROSS/BLUE SHIELD | Admitting: Pharmacist Clinician (PhC)/ Clinical Pharmacy Specialist

## 2016-09-11 DIAGNOSIS — I82409 Acute embolism and thrombosis of unspecified deep veins of unspecified lower extremity: Secondary | ICD-10-CM

## 2016-09-11 DIAGNOSIS — Z7901 Long term (current) use of anticoagulants: Secondary | ICD-10-CM

## 2016-09-11 LAB — POCT INR: INR: 3.6

## 2016-09-21 ENCOUNTER — Telehealth: Payer: Self-pay | Admitting: Cardiovascular Disease

## 2016-09-21 NOTE — Telephone Encounter (Signed)
Patient states she called 3-4 times Thur/Fri/Mon, was put on hold x 10+ minutes each time.  Never got thru.   Was calling to reschedule her coumadin appt, current one conflicts with another MD appt.  Apologized for phone difficulties, gave her direct # to Coumadin clinic.  Appt rescheduled.

## 2016-09-21 NOTE — Telephone Encounter (Signed)
New Message  Pt voiced to have Coumadin nurse at [facility] to reach back out to her regarding her schedule per Evelyn [name].  Pt voiced she was informed to contact specific lines for scheduling at [facility] location but no one is answering and for either the scheduler at [place] or for Coumadin nurse at [place] to reach back out to her.  I provided dates to pt and pt declined and wanted to speak with Coumadin nurse, specifically Evelyn [name].  Please f/u with pt

## 2016-10-01 ENCOUNTER — Ambulatory Visit (INDEPENDENT_AMBULATORY_CARE_PROVIDER_SITE_OTHER): Payer: BLUE CROSS/BLUE SHIELD | Admitting: Pharmacist

## 2016-10-01 DIAGNOSIS — Z7901 Long term (current) use of anticoagulants: Secondary | ICD-10-CM

## 2016-10-01 DIAGNOSIS — I82409 Acute embolism and thrombosis of unspecified deep veins of unspecified lower extremity: Secondary | ICD-10-CM | POA: Diagnosis not present

## 2016-10-01 LAB — POCT INR: INR: 1.4

## 2016-10-16 ENCOUNTER — Ambulatory Visit (INDEPENDENT_AMBULATORY_CARE_PROVIDER_SITE_OTHER): Payer: BLUE CROSS/BLUE SHIELD | Admitting: Pharmacist

## 2016-10-16 DIAGNOSIS — Z7901 Long term (current) use of anticoagulants: Secondary | ICD-10-CM

## 2016-10-16 DIAGNOSIS — I82409 Acute embolism and thrombosis of unspecified deep veins of unspecified lower extremity: Secondary | ICD-10-CM | POA: Diagnosis not present

## 2016-10-16 LAB — POCT INR: INR: 1.4

## 2016-10-30 ENCOUNTER — Ambulatory Visit (INDEPENDENT_AMBULATORY_CARE_PROVIDER_SITE_OTHER): Payer: BLUE CROSS/BLUE SHIELD | Admitting: Pharmacist

## 2016-10-30 DIAGNOSIS — I82409 Acute embolism and thrombosis of unspecified deep veins of unspecified lower extremity: Secondary | ICD-10-CM

## 2016-10-30 DIAGNOSIS — Z7901 Long term (current) use of anticoagulants: Secondary | ICD-10-CM

## 2016-10-30 LAB — POCT INR: INR: 2.2

## 2016-11-27 ENCOUNTER — Ambulatory Visit (INDEPENDENT_AMBULATORY_CARE_PROVIDER_SITE_OTHER): Payer: BLUE CROSS/BLUE SHIELD | Admitting: Pharmacist

## 2016-11-27 DIAGNOSIS — I82409 Acute embolism and thrombosis of unspecified deep veins of unspecified lower extremity: Secondary | ICD-10-CM

## 2016-11-27 DIAGNOSIS — Z7901 Long term (current) use of anticoagulants: Secondary | ICD-10-CM | POA: Diagnosis not present

## 2016-11-27 LAB — POCT INR: INR: 3.5

## 2016-12-18 ENCOUNTER — Ambulatory Visit (INDEPENDENT_AMBULATORY_CARE_PROVIDER_SITE_OTHER): Payer: Managed Care, Other (non HMO) | Admitting: Pharmacist Clinician (PhC)/ Clinical Pharmacy Specialist

## 2016-12-18 DIAGNOSIS — I82409 Acute embolism and thrombosis of unspecified deep veins of unspecified lower extremity: Secondary | ICD-10-CM | POA: Diagnosis not present

## 2016-12-18 DIAGNOSIS — Z7901 Long term (current) use of anticoagulants: Secondary | ICD-10-CM

## 2016-12-18 LAB — POCT INR: INR: 2.4

## 2017-01-15 ENCOUNTER — Ambulatory Visit (INDEPENDENT_AMBULATORY_CARE_PROVIDER_SITE_OTHER): Payer: Managed Care, Other (non HMO) | Admitting: Pharmacist

## 2017-01-15 DIAGNOSIS — I82409 Acute embolism and thrombosis of unspecified deep veins of unspecified lower extremity: Secondary | ICD-10-CM | POA: Diagnosis not present

## 2017-01-15 DIAGNOSIS — Z7901 Long term (current) use of anticoagulants: Secondary | ICD-10-CM | POA: Diagnosis not present

## 2017-01-15 LAB — POCT INR: INR: 3.7

## 2017-01-28 ENCOUNTER — Ambulatory Visit (INDEPENDENT_AMBULATORY_CARE_PROVIDER_SITE_OTHER): Payer: Managed Care, Other (non HMO) | Admitting: Pharmacist Clinician (PhC)/ Clinical Pharmacy Specialist

## 2017-01-28 DIAGNOSIS — Z7901 Long term (current) use of anticoagulants: Secondary | ICD-10-CM | POA: Diagnosis not present

## 2017-01-28 DIAGNOSIS — I82409 Acute embolism and thrombosis of unspecified deep veins of unspecified lower extremity: Secondary | ICD-10-CM

## 2017-01-28 LAB — POCT INR: INR: 3.4

## 2017-02-12 ENCOUNTER — Ambulatory Visit (INDEPENDENT_AMBULATORY_CARE_PROVIDER_SITE_OTHER): Payer: Managed Care, Other (non HMO) | Admitting: Pharmacist Clinician (PhC)/ Clinical Pharmacy Specialist

## 2017-02-12 DIAGNOSIS — Z7901 Long term (current) use of anticoagulants: Secondary | ICD-10-CM | POA: Diagnosis not present

## 2017-02-12 DIAGNOSIS — I82409 Acute embolism and thrombosis of unspecified deep veins of unspecified lower extremity: Secondary | ICD-10-CM | POA: Diagnosis not present

## 2017-02-12 LAB — POCT INR: INR: 3.6

## 2017-03-05 ENCOUNTER — Ambulatory Visit (INDEPENDENT_AMBULATORY_CARE_PROVIDER_SITE_OTHER): Payer: Managed Care, Other (non HMO) | Admitting: Pharmacist Clinician (PhC)/ Clinical Pharmacy Specialist

## 2017-03-05 DIAGNOSIS — Z7901 Long term (current) use of anticoagulants: Secondary | ICD-10-CM | POA: Diagnosis not present

## 2017-03-05 DIAGNOSIS — I82409 Acute embolism and thrombosis of unspecified deep veins of unspecified lower extremity: Secondary | ICD-10-CM

## 2017-03-05 LAB — POCT INR: INR: 2.4

## 2017-03-26 ENCOUNTER — Ambulatory Visit (INDEPENDENT_AMBULATORY_CARE_PROVIDER_SITE_OTHER): Payer: Managed Care, Other (non HMO) | Admitting: Pharmacist Clinician (PhC)/ Clinical Pharmacy Specialist

## 2017-03-26 DIAGNOSIS — I82409 Acute embolism and thrombosis of unspecified deep veins of unspecified lower extremity: Secondary | ICD-10-CM

## 2017-03-26 DIAGNOSIS — Z7901 Long term (current) use of anticoagulants: Secondary | ICD-10-CM | POA: Diagnosis not present

## 2017-03-26 LAB — POCT INR: INR: 2.9

## 2017-04-13 IMAGING — CT CT HEAD W/O CM
2 series · 16 of 30 positions shown, 18 images · non-contrast
Comparison: 08/02/2013

CLINICAL DATA: Code stroke, became dizzy at 900 hours and fell
backwards down steps striking back of head, headache, BILATERAL
upper extremity weakness, history seizures, pulmonary embolism

EXAM:
CT HEAD WITHOUT CONTRAST
TECHNIQUE: Contiguous axial images were obtained from the base of the skull
through the vertex without intravenous contrast.

[Series 201: head w/o, idose (1) · axial · non-contrast · 0.49mm/px · z∈[+813,+928]mm · 8 of 31 slices shown, 10 images]
[im 4/31  brain]
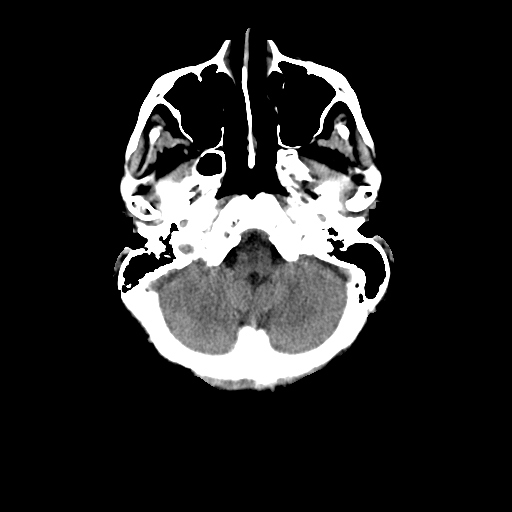
[im 4/31  bone]
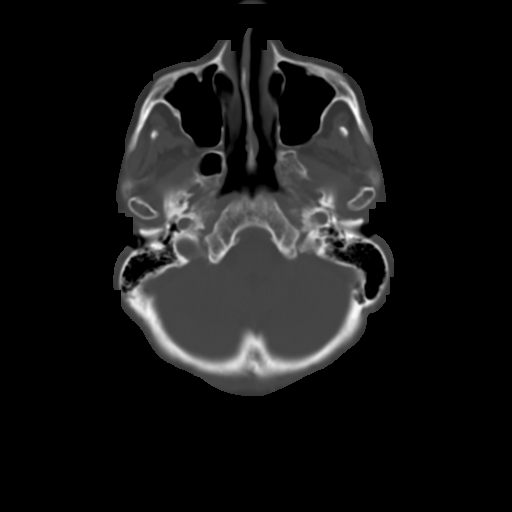
[im 7/31  brain]
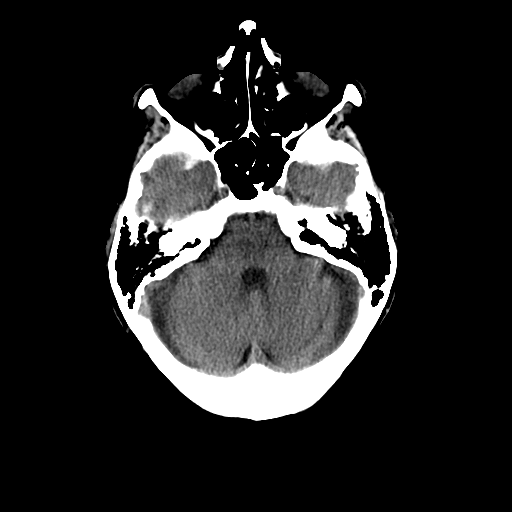
[im 11/31  brain]
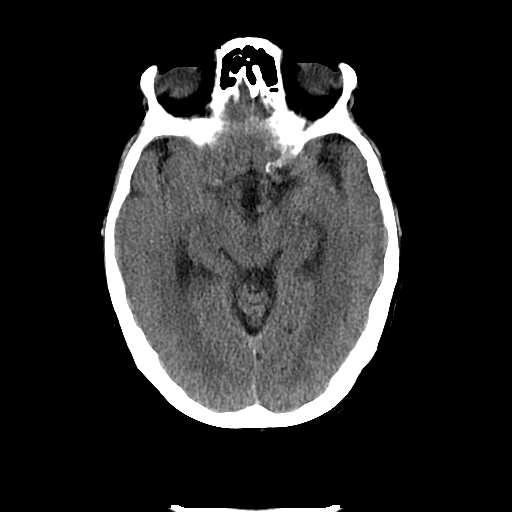
[im 14/31  brain]
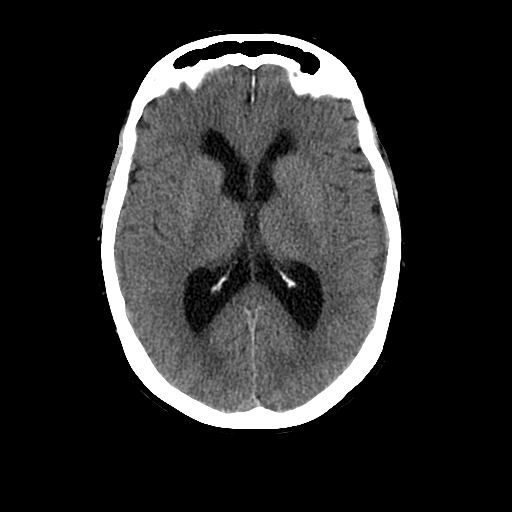
[im 17/31  brain]
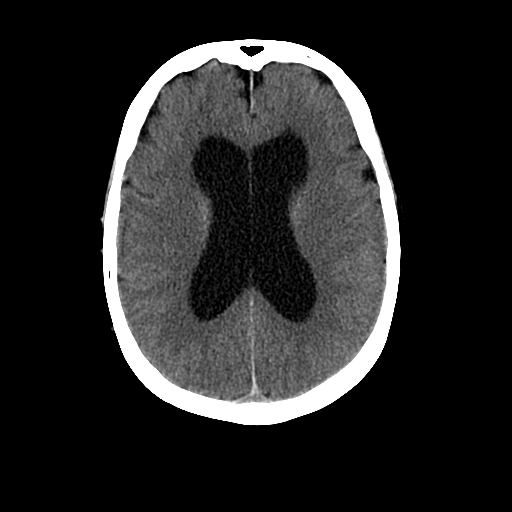
[im 17/31  bone]
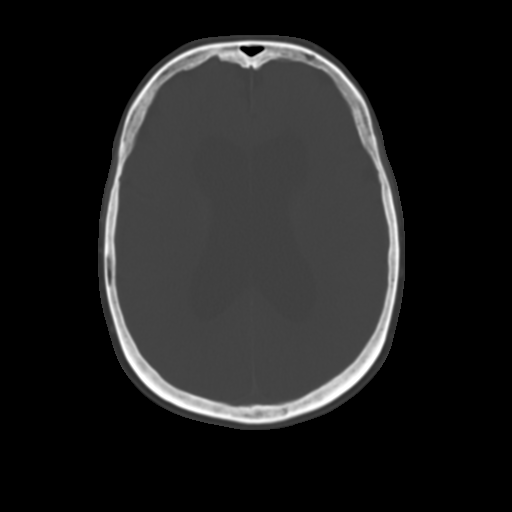
[im 21/31  brain]
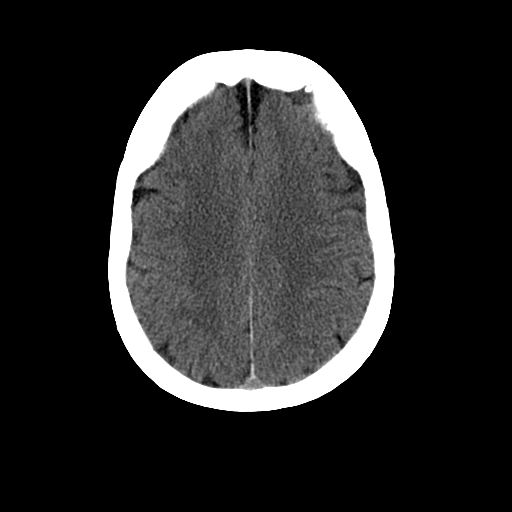
[im 24/31  brain]
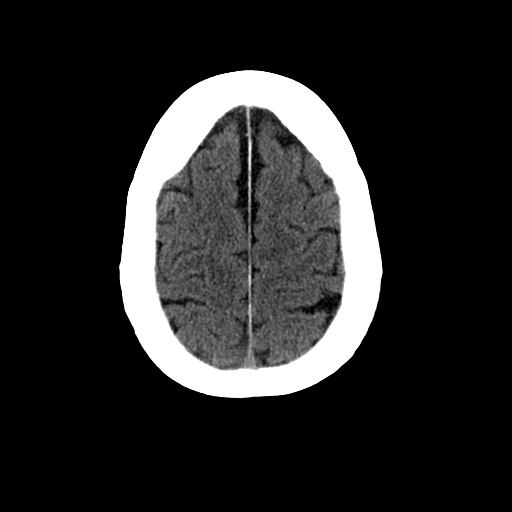
[im 27/31  brain]
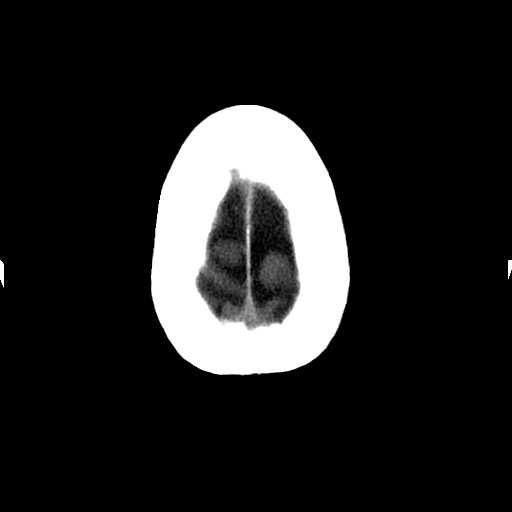

[Series 202: head w/o bone, idose (1) · axial · non-contrast · 0.49mm/px · z∈[+812,+932]mm · 8 of 62 slices shown]
[im 7/62  bone]
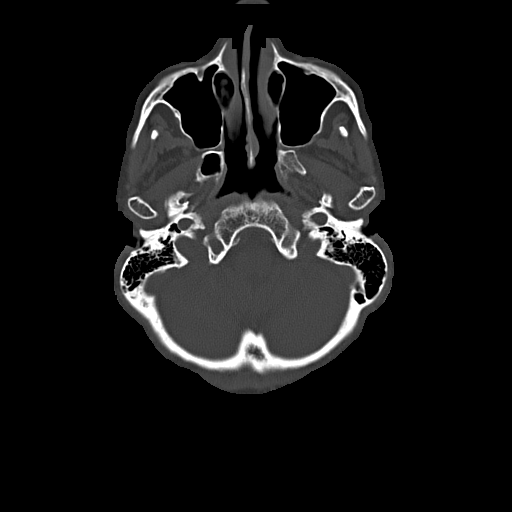
[im 13/62  bone]
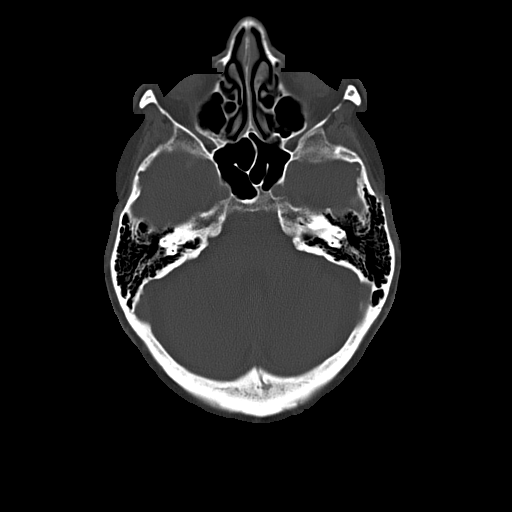
[im 20/62  bone]
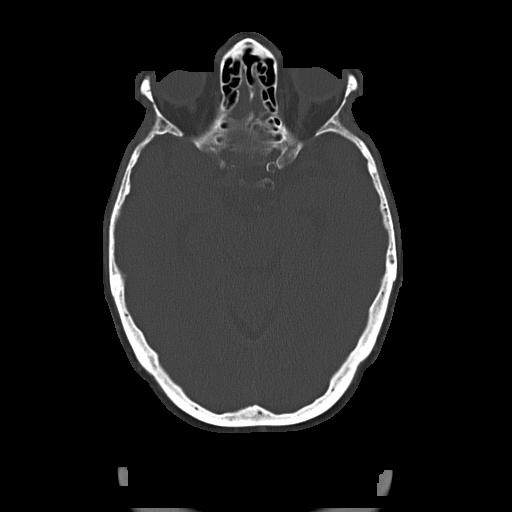
[im 26/62  bone]
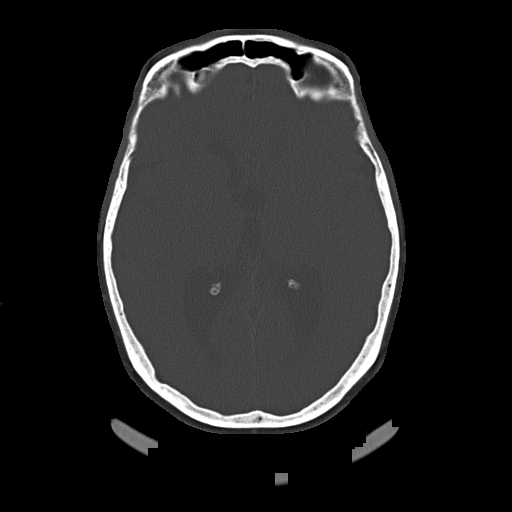
[im 36/62  bone]
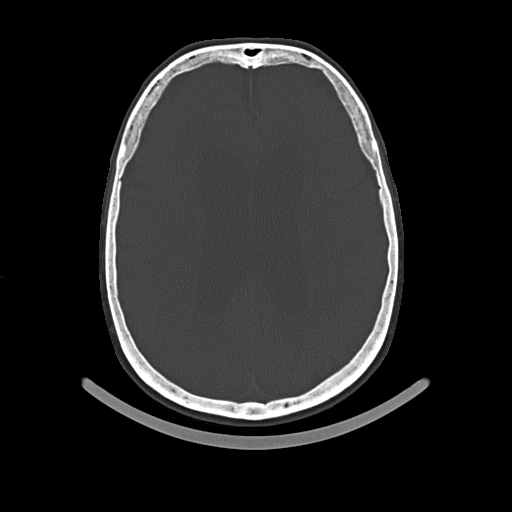
[im 42/62  bone]
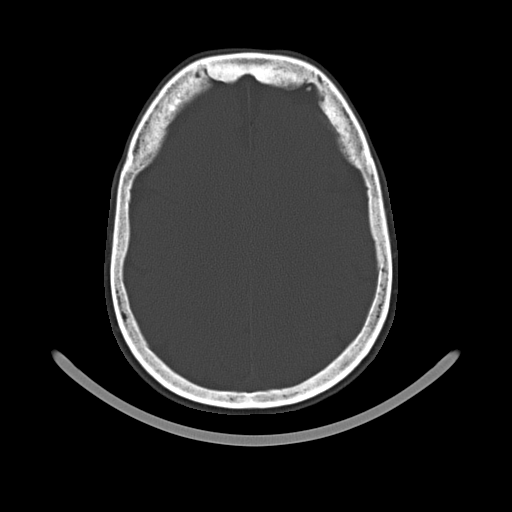
[im 49/62  bone]
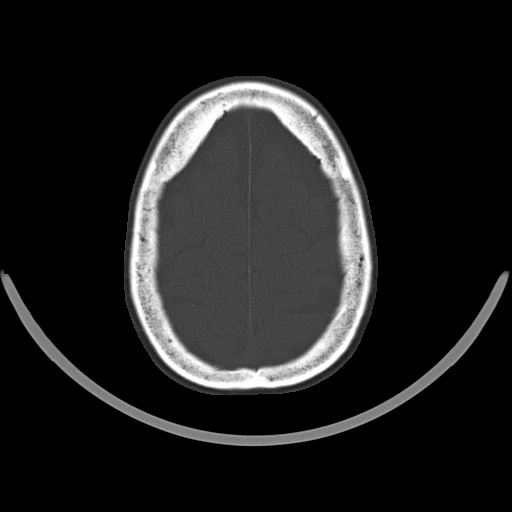
[im 55/62  bone]
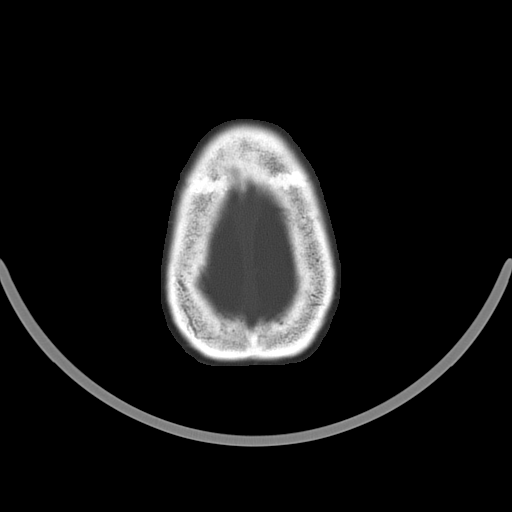

[16 of 30 positions shown; findings below may reference images not displayed]

FINDINGS: Diffuse dilatation of the ventricular system similar to the previous
exam, out of proportion to the degree of cortical atrophy, question
normal pressure hydrocephalus.

No midline shift or mass effect.

Otherwise normal appearance of brain parenchyma.

No intracranial hemorrhage, mass lesion, or evidence acute
infarction.

No definite extra-axial collections.

Few streak artifacts of calvarial origin along the lateral margins
the temporal lobes bilaterally.

Atherosclerotic calcifications at the carotid siphons bilaterally.

Bones and sinuses unremarkable.
IMPRESSION: Persistent dilatation of the ventricular system similar to previous
exam question normal pressure hydrocephalus.

No acute intracranial abnormalities.

Findings called to Dr. Ava on 12/29/2015 at 1381 hours.

## 2017-04-23 ENCOUNTER — Ambulatory Visit (INDEPENDENT_AMBULATORY_CARE_PROVIDER_SITE_OTHER): Payer: Managed Care, Other (non HMO) | Admitting: Pharmacist Clinician (PhC)/ Clinical Pharmacy Specialist

## 2017-04-23 DIAGNOSIS — Z7901 Long term (current) use of anticoagulants: Secondary | ICD-10-CM | POA: Diagnosis not present

## 2017-04-23 DIAGNOSIS — I82409 Acute embolism and thrombosis of unspecified deep veins of unspecified lower extremity: Secondary | ICD-10-CM | POA: Diagnosis not present

## 2017-04-23 LAB — POCT INR: INR: 2.9

## 2017-05-11 ENCOUNTER — Other Ambulatory Visit: Payer: Self-pay | Admitting: Cardiovascular Disease

## 2017-05-21 ENCOUNTER — Ambulatory Visit (INDEPENDENT_AMBULATORY_CARE_PROVIDER_SITE_OTHER): Payer: Managed Care, Other (non HMO) | Admitting: Pharmacist

## 2017-05-21 DIAGNOSIS — I82409 Acute embolism and thrombosis of unspecified deep veins of unspecified lower extremity: Secondary | ICD-10-CM

## 2017-05-21 DIAGNOSIS — Z7901 Long term (current) use of anticoagulants: Secondary | ICD-10-CM

## 2017-05-21 LAB — POCT INR: INR: 2

## 2017-06-22 ENCOUNTER — Ambulatory Visit (INDEPENDENT_AMBULATORY_CARE_PROVIDER_SITE_OTHER): Payer: Managed Care, Other (non HMO) | Admitting: Pharmacist

## 2017-06-22 DIAGNOSIS — Z7901 Long term (current) use of anticoagulants: Secondary | ICD-10-CM | POA: Diagnosis not present

## 2017-06-22 DIAGNOSIS — I82409 Acute embolism and thrombosis of unspecified deep veins of unspecified lower extremity: Secondary | ICD-10-CM

## 2017-06-22 LAB — POCT INR: INR: 2.1

## 2017-08-03 ENCOUNTER — Ambulatory Visit (INDEPENDENT_AMBULATORY_CARE_PROVIDER_SITE_OTHER): Payer: Managed Care, Other (non HMO) | Admitting: Pharmacist

## 2017-08-03 DIAGNOSIS — I82409 Acute embolism and thrombosis of unspecified deep veins of unspecified lower extremity: Secondary | ICD-10-CM | POA: Diagnosis not present

## 2017-08-03 DIAGNOSIS — Z7901 Long term (current) use of anticoagulants: Secondary | ICD-10-CM | POA: Diagnosis not present

## 2017-08-03 LAB — POCT INR: INR: 2

## 2017-08-19 ENCOUNTER — Other Ambulatory Visit: Payer: Self-pay | Admitting: Cardiovascular Disease

## 2017-09-29 ENCOUNTER — Ambulatory Visit (INDEPENDENT_AMBULATORY_CARE_PROVIDER_SITE_OTHER): Payer: Managed Care, Other (non HMO) | Admitting: Pharmacist

## 2017-09-29 DIAGNOSIS — Z7901 Long term (current) use of anticoagulants: Secondary | ICD-10-CM

## 2017-09-29 DIAGNOSIS — I82409 Acute embolism and thrombosis of unspecified deep veins of unspecified lower extremity: Secondary | ICD-10-CM

## 2017-09-29 LAB — POCT INR: INR: 1.5

## 2017-10-20 ENCOUNTER — Ambulatory Visit (INDEPENDENT_AMBULATORY_CARE_PROVIDER_SITE_OTHER): Payer: Managed Care, Other (non HMO) | Admitting: Pharmacist

## 2017-10-20 DIAGNOSIS — I82409 Acute embolism and thrombosis of unspecified deep veins of unspecified lower extremity: Secondary | ICD-10-CM

## 2017-10-20 DIAGNOSIS — Z7901 Long term (current) use of anticoagulants: Secondary | ICD-10-CM

## 2017-10-20 LAB — POCT INR: INR: 1.3

## 2017-11-03 ENCOUNTER — Ambulatory Visit (INDEPENDENT_AMBULATORY_CARE_PROVIDER_SITE_OTHER): Payer: Managed Care, Other (non HMO) | Admitting: Pharmacist

## 2017-11-03 DIAGNOSIS — Z7901 Long term (current) use of anticoagulants: Secondary | ICD-10-CM | POA: Diagnosis not present

## 2017-11-03 DIAGNOSIS — I82409 Acute embolism and thrombosis of unspecified deep veins of unspecified lower extremity: Secondary | ICD-10-CM

## 2017-11-03 LAB — POCT INR: INR: 1.4

## 2017-11-15 ENCOUNTER — Ambulatory Visit (INDEPENDENT_AMBULATORY_CARE_PROVIDER_SITE_OTHER): Payer: Managed Care, Other (non HMO) | Admitting: Pharmacist Clinician (PhC)/ Clinical Pharmacy Specialist

## 2017-11-15 DIAGNOSIS — Z7901 Long term (current) use of anticoagulants: Secondary | ICD-10-CM | POA: Diagnosis not present

## 2017-11-15 DIAGNOSIS — I82409 Acute embolism and thrombosis of unspecified deep veins of unspecified lower extremity: Secondary | ICD-10-CM | POA: Diagnosis not present

## 2017-11-15 DIAGNOSIS — I82623 Acute embolism and thrombosis of deep veins of upper extremity, bilateral: Secondary | ICD-10-CM | POA: Diagnosis not present

## 2017-11-15 LAB — POCT INR: INR: 2.9

## 2017-12-02 ENCOUNTER — Ambulatory Visit (INDEPENDENT_AMBULATORY_CARE_PROVIDER_SITE_OTHER): Payer: Managed Care, Other (non HMO) | Admitting: Pharmacist

## 2017-12-02 DIAGNOSIS — I82409 Acute embolism and thrombosis of unspecified deep veins of unspecified lower extremity: Secondary | ICD-10-CM

## 2017-12-02 DIAGNOSIS — Z7901 Long term (current) use of anticoagulants: Secondary | ICD-10-CM | POA: Diagnosis not present

## 2017-12-02 LAB — POCT INR: INR: 3.2

## 2017-12-21 ENCOUNTER — Ambulatory Visit (INDEPENDENT_AMBULATORY_CARE_PROVIDER_SITE_OTHER): Payer: Managed Care, Other (non HMO) | Admitting: Pharmacist Clinician (PhC)/ Clinical Pharmacy Specialist

## 2017-12-21 DIAGNOSIS — I82409 Acute embolism and thrombosis of unspecified deep veins of unspecified lower extremity: Secondary | ICD-10-CM

## 2017-12-21 DIAGNOSIS — Z7901 Long term (current) use of anticoagulants: Secondary | ICD-10-CM

## 2017-12-21 DIAGNOSIS — I82499 Acute embolism and thrombosis of other specified deep vein of unspecified lower extremity: Secondary | ICD-10-CM | POA: Diagnosis not present

## 2017-12-21 LAB — POCT INR: INR: 2.7

## 2018-01-18 ENCOUNTER — Ambulatory Visit (INDEPENDENT_AMBULATORY_CARE_PROVIDER_SITE_OTHER): Payer: Managed Care, Other (non HMO) | Admitting: Pharmacist

## 2018-01-18 DIAGNOSIS — I82409 Acute embolism and thrombosis of unspecified deep veins of unspecified lower extremity: Secondary | ICD-10-CM

## 2018-01-18 DIAGNOSIS — Z7901 Long term (current) use of anticoagulants: Secondary | ICD-10-CM

## 2018-01-18 LAB — POCT INR: INR: 2.4

## 2018-02-20 ENCOUNTER — Other Ambulatory Visit: Payer: Self-pay | Admitting: Cardiovascular Disease

## 2018-02-22 ENCOUNTER — Ambulatory Visit (INDEPENDENT_AMBULATORY_CARE_PROVIDER_SITE_OTHER): Payer: Managed Care, Other (non HMO) | Admitting: Pharmacist Clinician (PhC)/ Clinical Pharmacy Specialist

## 2018-02-22 DIAGNOSIS — I82409 Acute embolism and thrombosis of unspecified deep veins of unspecified lower extremity: Secondary | ICD-10-CM | POA: Diagnosis not present

## 2018-02-22 DIAGNOSIS — Z7901 Long term (current) use of anticoagulants: Secondary | ICD-10-CM | POA: Diagnosis not present

## 2018-02-22 LAB — POCT INR: INR: 2

## 2018-03-29 ENCOUNTER — Ambulatory Visit (INDEPENDENT_AMBULATORY_CARE_PROVIDER_SITE_OTHER): Payer: Managed Care, Other (non HMO) | Admitting: Pharmacist Clinician (PhC)/ Clinical Pharmacy Specialist

## 2018-03-29 DIAGNOSIS — I82409 Acute embolism and thrombosis of unspecified deep veins of unspecified lower extremity: Secondary | ICD-10-CM | POA: Diagnosis not present

## 2018-03-29 DIAGNOSIS — Z7901 Long term (current) use of anticoagulants: Secondary | ICD-10-CM | POA: Diagnosis not present

## 2018-03-29 LAB — POCT INR: INR: 2.8

## 2018-05-11 ENCOUNTER — Ambulatory Visit (INDEPENDENT_AMBULATORY_CARE_PROVIDER_SITE_OTHER): Payer: Managed Care, Other (non HMO) | Admitting: Pharmacist

## 2018-05-11 DIAGNOSIS — Z7901 Long term (current) use of anticoagulants: Secondary | ICD-10-CM | POA: Diagnosis not present

## 2018-05-11 DIAGNOSIS — I82409 Acute embolism and thrombosis of unspecified deep veins of unspecified lower extremity: Secondary | ICD-10-CM

## 2018-05-11 LAB — POCT INR: INR: 1.6 — AB (ref 2.0–3.0)

## 2018-05-24 ENCOUNTER — Other Ambulatory Visit: Payer: Self-pay | Admitting: Cardiovascular Disease

## 2018-05-26 ENCOUNTER — Ambulatory Visit (INDEPENDENT_AMBULATORY_CARE_PROVIDER_SITE_OTHER): Payer: Managed Care, Other (non HMO) | Admitting: Pharmacist Clinician (PhC)/ Clinical Pharmacy Specialist

## 2018-05-26 DIAGNOSIS — I82409 Acute embolism and thrombosis of unspecified deep veins of unspecified lower extremity: Secondary | ICD-10-CM | POA: Diagnosis not present

## 2018-05-26 DIAGNOSIS — Z7901 Long term (current) use of anticoagulants: Secondary | ICD-10-CM

## 2018-05-26 LAB — POCT INR: INR: 2.4 (ref 2.0–3.0)

## 2018-06-21 ENCOUNTER — Ambulatory Visit: Payer: Managed Care, Other (non HMO) | Admitting: Cardiovascular Disease

## 2018-06-22 ENCOUNTER — Ambulatory Visit (INDEPENDENT_AMBULATORY_CARE_PROVIDER_SITE_OTHER): Payer: Managed Care, Other (non HMO) | Admitting: Pharmacist Clinician (PhC)/ Clinical Pharmacy Specialist

## 2018-06-22 ENCOUNTER — Encounter: Payer: Self-pay | Admitting: Cardiovascular Disease

## 2018-06-22 ENCOUNTER — Ambulatory Visit: Payer: Managed Care, Other (non HMO) | Admitting: Cardiovascular Disease

## 2018-06-22 VITALS — BP 116/52 | HR 88 | Ht 66.0 in | Wt 149.0 lb

## 2018-06-22 DIAGNOSIS — Z7901 Long term (current) use of anticoagulants: Secondary | ICD-10-CM

## 2018-06-22 DIAGNOSIS — I82409 Acute embolism and thrombosis of unspecified deep veins of unspecified lower extremity: Secondary | ICD-10-CM | POA: Diagnosis not present

## 2018-06-22 LAB — POCT INR: INR: 2.5 (ref 2.0–3.0)

## 2018-06-22 NOTE — Assessment & Plan Note (Signed)
History of bilateral DVT with pulmonary emboli on Coumadin anticoagulation status post IV C filter placement.  She was found to be hypercoagulable.  She said no recurrent symptoms.  Her INRs are checked frequently in our office by Cyril Mourning.

## 2018-06-22 NOTE — Patient Instructions (Signed)
Medication Instructions: Your physician recommends that you continue on your current medications as directed. Please refer to the Current Medication list given to you today.  If you need a refill on your cardiac medications before your next appointment, please call your pharmacy.    Follow-Up: Your physician wants you to follow-up as needed with Dr. Gwenlyn Found.  Thank you for choosing [facility] at [facility]!!

## 2018-06-22 NOTE — Progress Notes (Signed)
**Note Evelyn-Identified via Obfuscation** 06/22/2018 Evelyn [name]   08-28-1946  [id number]  Primary Physician Prince Solian, MD Primary Cardiologist: [name] MD Lupe Carney, [place]  HPI:  Evelyn [name] is a 72 y.o.  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 Shanon Brow Hardings.  I last saw her in the office 07/04/2014.  She has a history of bilateral DVT and pulmonary emboli on Coumadin anticoagulation status post IVC filter placement. She has no other cardiac risk factors. She apparently had a hypercoagulable workup by Dr. Joan Flores at [facility] however she is unaware of the findings. Since I saw her last 05/10/13 she has been completely asymptomatic Since I saw her 4 years ago she is remained stable except for some complaints of dizziness which sounds like vertigo.  She said no recurrent thrombotic episodes and has remained on Coumadin anticoagulation.  She denies chest pain or shortness of breath.  Current Meds  Medication Sig  . acetaminophen (TYLENOL) 500 MG tablet Take 500 mg by mouth every 6 (six) hours as needed.  . Calcium Citrate-Vitamin D (CALCIUM CITRATE + D PO) Take 1 tablet by mouth 2 (two) times daily with a meal.  . cetirizine (ZYRTEC) 10 MG tablet Take 10 mg by mouth daily.  . Coenzyme Q10 50 MG CAPS Take 1 capsule by mouth every morning.  . enoxaparin (LOVENOX) 60 MG/0.6ML injection Inject 0.6 mLs (60 mg total) into the skin every 12 (twelve) hours.  . Glucosamine-Chondroitin (GLUCOSAMINE CHONDR COMPLEX PO) Take 1 tablet by mouth 2 (two) times daily with a meal.  . HYDROcodone-acetaminophen (NORCO/VICODIN) 5-325 MG tablet Take 1 tablet by mouth every 6 (six) hours as needed for moderate pain.  Marland Kitchen lamoTRIgine (LAMICTAL) 100 MG tablet Take 50 mg by mouth 2 (two) times daily. Takes with 200mg  tablets for a total dose of 250mg  twice a day  . lamoTRIgine (LAMICTAL) 200 MG tablet Take by mouth 2 (two) times daily.  Takes with 50mg  tablets for a total dose of 250mg  twice a day  . mirtazapine (REMERON) 7.5 MG tablet Take 0.5 tablets by mouth at bedtime.  . Multiple Vitamin (MULTIVITAMIN WITH MINERALS) TABS tablet Take 1 tablet by mouth 2 (two) times daily with a meal.  . Omega-3 Fatty Acids (OMEGA 3 PO) Take 1 capsule by mouth every morning.  . topiramate (TOPAMAX) 25 MG tablet Take 50-75 mg by mouth 2 (two) times daily. 3 tablets in the morning; 2 tablets in the evening  . warfarin (COUMADIN) 7.5 MG tablet Take 1 to 1.5 tablets by mouth daily as directed by coumadin clinic  . warfarin (COUMADIN) 7.5 MG tablet Take 1 to 1 and 1/2 tablets daily as directed by coumadin clinic     Allergies  Allergen Reactions  . Tegretol [Carbamazepine] Rash    Social History   Socioeconomic History  . Marital status: Married    Spouse name: Not on file  . Number of children: Not on file  . Years of education: Not on file  . Highest education level: Not on file  Occupational History  . Not on file  Social Needs  . Financial resource strain: Not on file  . Food insecurity:    Worry: Not on file    Inability: Not on file  . Transportation needs:    Medical: Not on file    Non-medical: Not on file  Tobacco Use  . Smoking status: Never  Smoker  . Smokeless tobacco: Never Used  Substance and Sexual Activity  . Alcohol use: No  . Drug use: No  . Sexual activity: Not on file  Lifestyle  . Physical activity:    Days per week: Not on file    Minutes per session: Not on file  . Stress: Not on file  Relationships  . Social connections:    Talks on phone: Not on file    Gets together: Not on file    Attends religious service: Not on file    Active member of club or organization: Not on file    Attends meetings of clubs or organizations: Not on file    Relationship status: Not on file  . Intimate partner violence:    Fear of current or ex partner: Not on file    Emotionally abused: Not on file    Physically  abused: Not on file    Forced sexual activity: Not on file  Other Topics Concern  . Not on file  Social History Narrative  . Not on file     Review of Systems: General: negative for chills, fever, night sweats or weight changes.  Cardiovascular: negative for chest pain, dyspnea on exertion, edema, orthopnea, palpitations, paroxysmal nocturnal dyspnea or shortness of breath Dermatological: negative for rash Respiratory: negative for cough or wheezing Urologic: negative for hematuria Abdominal: negative for nausea, vomiting, diarrhea, bright red blood per rectum, melena, or hematemesis Neurologic: negative for visual changes, syncope, or dizziness All other systems reviewed and are otherwise negative except as noted above.    Blood pressure (!) 116/52, pulse 88, height 5\' 6"  (1.676 m), weight 149 lb (67.6 kg).  General appearance: alert and no distress Neck: no adenopathy, no carotid bruit, no JVD, supple, symmetrical, trachea midline and thyroid not enlarged, symmetric, no tenderness/mass/nodules Lungs: clear to auscultation bilaterally Heart: regular rate and rhythm, S1, S2 normal, no murmur, click, rub or gallop Extremities: extremities normal, atraumatic, no cyanosis or edema Pulses: 2+ and symmetric Skin: Skin color, texture, turgor normal. No rashes or lesions Neurologic: Alert and oriented X 3, normal strength and tone. Normal symmetric reflexes. Normal coordination and gait  EKG sinus rhythm 88 without ST or T wave changes.  I personally reviewed this EKG.  ASSESSMENT AND PLAN:   DVT (deep venous thrombosis) History of bilateral DVT with pulmonary emboli on Coumadin anticoagulation status post IV C filter placement.  She was found to be hypercoagulable.  She said no recurrent symptoms.  Her INRs are checked frequently in our office by Cyril Mourning.      [name] MD FACP,FACC,FAHA, [facility] 06/22/2018 10:05 AM

## 2018-07-21 ENCOUNTER — Ambulatory Visit (INDEPENDENT_AMBULATORY_CARE_PROVIDER_SITE_OTHER): Payer: Managed Care, Other (non HMO) | Admitting: Pharmacist Clinician (PhC)/ Clinical Pharmacy Specialist

## 2018-07-21 DIAGNOSIS — I2699 Other pulmonary embolism without acute cor pulmonale: Secondary | ICD-10-CM

## 2018-07-21 DIAGNOSIS — I82409 Acute embolism and thrombosis of unspecified deep veins of unspecified lower extremity: Secondary | ICD-10-CM

## 2018-07-21 DIAGNOSIS — Z7901 Long term (current) use of anticoagulants: Secondary | ICD-10-CM | POA: Diagnosis not present

## 2018-07-21 LAB — POCT INR: INR: 3 (ref 2.0–3.0)

## 2018-07-26 ENCOUNTER — Other Ambulatory Visit: Payer: Self-pay | Admitting: Cardiovascular Disease

## 2018-08-18 ENCOUNTER — Ambulatory Visit (INDEPENDENT_AMBULATORY_CARE_PROVIDER_SITE_OTHER): Payer: Managed Care, Other (non HMO) | Admitting: Pharmacist

## 2018-08-18 DIAGNOSIS — I82409 Acute embolism and thrombosis of unspecified deep veins of unspecified lower extremity: Secondary | ICD-10-CM | POA: Diagnosis not present

## 2018-08-18 DIAGNOSIS — Z7901 Long term (current) use of anticoagulants: Secondary | ICD-10-CM

## 2018-08-18 LAB — POCT INR: INR: 2.8 (ref 2.0–3.0)

## 2018-09-22 ENCOUNTER — Ambulatory Visit (INDEPENDENT_AMBULATORY_CARE_PROVIDER_SITE_OTHER): Payer: Managed Care, Other (non HMO) | Admitting: Pharmacist Clinician (PhC)/ Clinical Pharmacy Specialist

## 2018-09-22 DIAGNOSIS — Z7901 Long term (current) use of anticoagulants: Secondary | ICD-10-CM

## 2018-09-22 DIAGNOSIS — I82409 Acute embolism and thrombosis of unspecified deep veins of unspecified lower extremity: Secondary | ICD-10-CM | POA: Diagnosis not present

## 2018-09-22 DIAGNOSIS — I2699 Other pulmonary embolism without acute cor pulmonale: Secondary | ICD-10-CM

## 2018-09-22 LAB — POCT INR: INR: 4 — AB (ref 2.0–3.0)

## 2018-09-22 NOTE — Patient Instructions (Signed)
Description   No warfarin today Thursday Oct 10, then continue with 1 tablet daily except for 1.5 tablets every Monday and  Friday . Repeat INR in 2 weeks

## 2018-10-07 ENCOUNTER — Ambulatory Visit (INDEPENDENT_AMBULATORY_CARE_PROVIDER_SITE_OTHER): Payer: Managed Care, Other (non HMO) | Admitting: Pharmacist

## 2018-10-07 DIAGNOSIS — Z7901 Long term (current) use of anticoagulants: Secondary | ICD-10-CM | POA: Diagnosis not present

## 2018-10-07 DIAGNOSIS — I82409 Acute embolism and thrombosis of unspecified deep veins of unspecified lower extremity: Secondary | ICD-10-CM | POA: Diagnosis not present

## 2018-10-07 LAB — POCT INR: INR: 3.8 — AB (ref 2.0–3.0)

## 2018-10-21 ENCOUNTER — Ambulatory Visit (INDEPENDENT_AMBULATORY_CARE_PROVIDER_SITE_OTHER): Payer: Managed Care, Other (non HMO) | Admitting: Pharmacist Clinician (PhC)/ Clinical Pharmacy Specialist

## 2018-10-21 DIAGNOSIS — Z7901 Long term (current) use of anticoagulants: Secondary | ICD-10-CM | POA: Diagnosis not present

## 2018-10-21 DIAGNOSIS — I82409 Acute embolism and thrombosis of unspecified deep veins of unspecified lower extremity: Secondary | ICD-10-CM | POA: Diagnosis not present

## 2018-10-21 DIAGNOSIS — I824Y9 Acute embolism and thrombosis of unspecified deep veins of unspecified proximal lower extremity: Secondary | ICD-10-CM

## 2018-10-21 LAB — POCT INR: INR: 2.3 (ref 2.0–3.0)

## 2018-10-21 NOTE — Patient Instructions (Signed)
Description   Continue with 1 tablet daily except for 1.5 tablets every Monday. Repeat INR in 4 weeks

## 2018-11-24 ENCOUNTER — Ambulatory Visit (INDEPENDENT_AMBULATORY_CARE_PROVIDER_SITE_OTHER): Payer: Managed Care, Other (non HMO) | Admitting: Pharmacist Clinician (PhC)/ Clinical Pharmacy Specialist

## 2018-11-24 DIAGNOSIS — I82409 Acute embolism and thrombosis of unspecified deep veins of unspecified lower extremity: Secondary | ICD-10-CM | POA: Diagnosis not present

## 2018-11-24 DIAGNOSIS — Z7901 Long term (current) use of anticoagulants: Secondary | ICD-10-CM | POA: Diagnosis not present

## 2018-11-24 LAB — POCT INR: INR: 2.4 (ref 2.0–3.0)

## 2018-12-17 ENCOUNTER — Other Ambulatory Visit: Payer: Self-pay | Admitting: Cardiovascular Disease

## 2018-12-22 ENCOUNTER — Ambulatory Visit (INDEPENDENT_AMBULATORY_CARE_PROVIDER_SITE_OTHER): Payer: Managed Care, Other (non HMO) | Admitting: Pharmacist Clinician (PhC)/ Clinical Pharmacy Specialist

## 2018-12-22 DIAGNOSIS — I824Y9 Acute embolism and thrombosis of unspecified deep veins of unspecified proximal lower extremity: Secondary | ICD-10-CM | POA: Diagnosis not present

## 2018-12-22 DIAGNOSIS — Z7901 Long term (current) use of anticoagulants: Secondary | ICD-10-CM

## 2018-12-22 DIAGNOSIS — I82409 Acute embolism and thrombosis of unspecified deep veins of unspecified lower extremity: Secondary | ICD-10-CM

## 2018-12-22 LAB — POCT INR: INR: 2.1 (ref 2.0–3.0)

## 2018-12-26 DIAGNOSIS — E878 Other disorders of electrolyte and fluid balance, not elsewhere classified: Secondary | ICD-10-CM | POA: Insufficient documentation

## 2019-01-17 ENCOUNTER — Ambulatory Visit (INDEPENDENT_AMBULATORY_CARE_PROVIDER_SITE_OTHER): Payer: Managed Care, Other (non HMO) | Admitting: Pharmacist

## 2019-01-17 DIAGNOSIS — I82409 Acute embolism and thrombosis of unspecified deep veins of unspecified lower extremity: Secondary | ICD-10-CM

## 2019-01-17 DIAGNOSIS — Z7901 Long term (current) use of anticoagulants: Secondary | ICD-10-CM | POA: Diagnosis not present

## 2019-01-17 LAB — POCT INR: INR: 2.1 (ref 2.0–3.0)

## 2019-02-21 ENCOUNTER — Ambulatory Visit (INDEPENDENT_AMBULATORY_CARE_PROVIDER_SITE_OTHER): Payer: Managed Care, Other (non HMO) | Admitting: Pharmacist Clinician (PhC)/ Clinical Pharmacy Specialist

## 2019-02-21 DIAGNOSIS — I82409 Acute embolism and thrombosis of unspecified deep veins of unspecified lower extremity: Secondary | ICD-10-CM | POA: Diagnosis not present

## 2019-02-21 DIAGNOSIS — Z7901 Long term (current) use of anticoagulants: Secondary | ICD-10-CM | POA: Diagnosis not present

## 2019-02-21 LAB — POCT INR: INR: 4.2 — AB (ref 2.0–3.0)

## 2019-03-08 ENCOUNTER — Telehealth: Payer: Self-pay | Admitting: Pharmacist Clinician (PhC)/ Clinical Pharmacy Specialist

## 2019-03-08 ENCOUNTER — Telehealth: Payer: Self-pay

## 2019-03-08 NOTE — Telephone Encounter (Signed)
LMOM FOR PRESCREEN AND DRIVE THRU 

## 2019-03-08 NOTE — Telephone Encounter (Signed)
° ° °  Please return call to patient °

## 2019-03-08 NOTE — Telephone Encounter (Signed)

## 2019-03-10 ENCOUNTER — Ambulatory Visit (INDEPENDENT_AMBULATORY_CARE_PROVIDER_SITE_OTHER): Payer: Self-pay | Admitting: *Deleted

## 2019-03-10 DIAGNOSIS — Z7901 Long term (current) use of anticoagulants: Secondary | ICD-10-CM

## 2019-03-10 DIAGNOSIS — I824Y3 Acute embolism and thrombosis of unspecified deep veins of proximal lower extremity, bilateral: Secondary | ICD-10-CM

## 2019-03-10 DIAGNOSIS — I82409 Acute embolism and thrombosis of unspecified deep veins of unspecified lower extremity: Secondary | ICD-10-CM

## 2019-03-10 LAB — POCT INR: INR: 2.8 (ref 2.0–3.0)

## 2019-04-04 ENCOUNTER — Telehealth: Payer: Self-pay

## 2019-04-04 NOTE — Telephone Encounter (Signed)
voicemail box full 

## 2019-04-07 ENCOUNTER — Ambulatory Visit (INDEPENDENT_AMBULATORY_CARE_PROVIDER_SITE_OTHER): Payer: Self-pay | Admitting: *Deleted

## 2019-04-07 ENCOUNTER — Other Ambulatory Visit: Payer: Self-pay

## 2019-04-07 DIAGNOSIS — I82409 Acute embolism and thrombosis of unspecified deep veins of unspecified lower extremity: Secondary | ICD-10-CM

## 2019-04-07 DIAGNOSIS — Z7901 Long term (current) use of anticoagulants: Secondary | ICD-10-CM

## 2019-04-07 LAB — POCT INR: INR: 4.2 — AB (ref 2.0–3.0)

## 2019-04-07 NOTE — Patient Instructions (Addendum)
Description   Spoke with pt and instructed pt to hold today's dose and take 1/2 tablet tomorrow then continue with 1 tablet daily except for 1.5 tablets every Monday. Repeat INR in 3 weeks.

## 2019-04-26 ENCOUNTER — Telehealth: Payer: Self-pay

## 2019-04-26 NOTE — Telephone Encounter (Signed)
lmom for prescreen  

## 2019-04-27 ENCOUNTER — Ambulatory Visit (INDEPENDENT_AMBULATORY_CARE_PROVIDER_SITE_OTHER): Payer: Self-pay | Admitting: Pharmacist

## 2019-04-27 ENCOUNTER — Other Ambulatory Visit: Payer: Self-pay

## 2019-04-27 DIAGNOSIS — Z7901 Long term (current) use of anticoagulants: Secondary | ICD-10-CM

## 2019-04-27 DIAGNOSIS — I82409 Acute embolism and thrombosis of unspecified deep veins of unspecified lower extremity: Secondary | ICD-10-CM

## 2019-04-27 LAB — POCT INR: INR: 3.5 — AB (ref 2.0–3.0)

## 2019-05-07 ENCOUNTER — Other Ambulatory Visit: Payer: Self-pay | Admitting: Cardiovascular Disease

## 2019-05-16 ENCOUNTER — Telehealth: Payer: Self-pay

## 2019-05-16 NOTE — Telephone Encounter (Signed)
lmom for prescreen  

## 2019-05-19 ENCOUNTER — Other Ambulatory Visit: Payer: Self-pay

## 2019-05-19 ENCOUNTER — Ambulatory Visit (INDEPENDENT_AMBULATORY_CARE_PROVIDER_SITE_OTHER): Payer: Managed Care, Other (non HMO) | Admitting: *Deleted

## 2019-05-19 DIAGNOSIS — Z7901 Long term (current) use of anticoagulants: Secondary | ICD-10-CM

## 2019-05-19 DIAGNOSIS — I82409 Acute embolism and thrombosis of unspecified deep veins of unspecified lower extremity: Secondary | ICD-10-CM | POA: Diagnosis not present

## 2019-05-19 LAB — POCT INR: INR: 1.9 — AB (ref 2.0–3.0)

## 2019-05-19 NOTE — Patient Instructions (Signed)
Description   Today take 1.5 tablets, then continue taking 1 tablet daily. Repeat INR in 3 weeks.

## 2019-05-20 NOTE — Progress Notes (Signed)
See INR. FYI

## 2019-06-01 ENCOUNTER — Telehealth: Payer: Self-pay

## 2019-06-01 NOTE — Telephone Encounter (Signed)

## 2019-06-01 NOTE — Telephone Encounter (Signed)
lmom for prescreen  

## 2019-06-08 ENCOUNTER — Ambulatory Visit (INDEPENDENT_AMBULATORY_CARE_PROVIDER_SITE_OTHER): Payer: Self-pay | Admitting: Pharmacist

## 2019-06-08 ENCOUNTER — Other Ambulatory Visit: Payer: Self-pay

## 2019-06-08 DIAGNOSIS — I82409 Acute embolism and thrombosis of unspecified deep veins of unspecified lower extremity: Secondary | ICD-10-CM

## 2019-06-08 DIAGNOSIS — Z7901 Long term (current) use of anticoagulants: Secondary | ICD-10-CM

## 2019-06-08 LAB — POCT INR: INR: 1.7 — AB (ref 2.0–3.0)

## 2019-07-04 ENCOUNTER — Telehealth: Payer: Self-pay

## 2019-07-04 NOTE — Telephone Encounter (Signed)
Unable to lmom for prescreen vmbox full 

## 2019-07-06 ENCOUNTER — Other Ambulatory Visit: Payer: Self-pay

## 2019-07-06 ENCOUNTER — Ambulatory Visit (INDEPENDENT_AMBULATORY_CARE_PROVIDER_SITE_OTHER): Payer: Managed Care, Other (non HMO) | Admitting: Pharmacist

## 2019-07-06 DIAGNOSIS — I82409 Acute embolism and thrombosis of unspecified deep veins of unspecified lower extremity: Secondary | ICD-10-CM

## 2019-07-06 DIAGNOSIS — Z7901 Long term (current) use of anticoagulants: Secondary | ICD-10-CM

## 2019-07-06 DIAGNOSIS — I824Y3 Acute embolism and thrombosis of unspecified deep veins of proximal lower extremity, bilateral: Secondary | ICD-10-CM

## 2019-07-06 LAB — POCT INR: INR: 3.6 — AB (ref 2.0–3.0)

## 2019-07-25 ENCOUNTER — Ambulatory Visit (INDEPENDENT_AMBULATORY_CARE_PROVIDER_SITE_OTHER): Payer: Managed Care, Other (non HMO) | Admitting: Pharmacist Clinician (PhC)/ Clinical Pharmacy Specialist

## 2019-07-25 ENCOUNTER — Other Ambulatory Visit: Payer: Self-pay

## 2019-07-25 DIAGNOSIS — Z7901 Long term (current) use of anticoagulants: Secondary | ICD-10-CM | POA: Diagnosis not present

## 2019-07-25 DIAGNOSIS — I824Y9 Acute embolism and thrombosis of unspecified deep veins of unspecified proximal lower extremity: Secondary | ICD-10-CM | POA: Diagnosis not present

## 2019-07-25 DIAGNOSIS — I82409 Acute embolism and thrombosis of unspecified deep veins of unspecified lower extremity: Secondary | ICD-10-CM

## 2019-07-25 LAB — POCT INR: INR: 2.2 (ref 2.0–3.0)

## 2019-08-14 ENCOUNTER — Other Ambulatory Visit: Payer: Self-pay | Admitting: Cardiovascular Disease

## 2019-08-14 NOTE — Telephone Encounter (Signed)
Please review for refill. Thank you! 

## 2019-08-21 ENCOUNTER — Encounter: Payer: Self-pay | Admitting: Gastroenterology

## 2019-09-01 ENCOUNTER — Other Ambulatory Visit: Payer: Self-pay

## 2019-09-01 ENCOUNTER — Ambulatory Visit (INDEPENDENT_AMBULATORY_CARE_PROVIDER_SITE_OTHER): Payer: Self-pay | Admitting: Pharmacist Clinician (PhC)/ Clinical Pharmacy Specialist

## 2019-09-01 DIAGNOSIS — I82409 Acute embolism and thrombosis of unspecified deep veins of unspecified lower extremity: Secondary | ICD-10-CM

## 2019-09-01 DIAGNOSIS — Z7901 Long term (current) use of anticoagulants: Secondary | ICD-10-CM

## 2019-09-01 LAB — POCT INR: INR: 3 (ref 2.0–3.0)

## 2019-10-20 ENCOUNTER — Other Ambulatory Visit: Payer: Self-pay

## 2019-10-20 ENCOUNTER — Ambulatory Visit (INDEPENDENT_AMBULATORY_CARE_PROVIDER_SITE_OTHER): Payer: Managed Care, Other (non HMO) | Admitting: Pharmacist

## 2019-10-20 DIAGNOSIS — Z7901 Long term (current) use of anticoagulants: Secondary | ICD-10-CM

## 2019-10-20 DIAGNOSIS — I2699 Other pulmonary embolism without acute cor pulmonale: Secondary | ICD-10-CM | POA: Diagnosis not present

## 2019-10-20 DIAGNOSIS — I82409 Acute embolism and thrombosis of unspecified deep veins of unspecified lower extremity: Secondary | ICD-10-CM

## 2019-10-20 LAB — POCT INR: INR: 2.9 (ref 2.0–3.0)

## 2019-11-03 ENCOUNTER — Ambulatory Visit: Payer: Managed Care, Other (non HMO) | Admitting: Gastroenterology

## 2019-11-03 ENCOUNTER — Encounter: Payer: Self-pay | Admitting: Gastroenterology

## 2019-11-03 VITALS — BP 120/70 | HR 80 | Temp 98.1°F | Ht 66.0 in | Wt 154.0 lb

## 2019-11-03 DIAGNOSIS — Z860100 Personal history of colon polyps, unspecified: Secondary | ICD-10-CM

## 2019-11-03 DIAGNOSIS — K5909 Other constipation: Secondary | ICD-10-CM | POA: Diagnosis not present

## 2019-11-03 DIAGNOSIS — Z7901 Long term (current) use of anticoagulants: Secondary | ICD-10-CM

## 2019-11-03 DIAGNOSIS — Z800 Family history of malignant neoplasm of digestive organs: Secondary | ICD-10-CM

## 2019-11-03 MED ORDER — LINACLOTIDE 145 MCG PO CAPS: 145.0000 ug | ORAL_CAPSULE | Freq: Every day | ORAL | 0 refills | Status: DC

## 2019-11-03 MED ORDER — POLYETHYLENE GLYCOL 3350 17 G PO PACK: 17.0000 g | PACK | Freq: Two times a day (BID) | ORAL | 0 refills | Status: DC

## 2019-11-03 NOTE — Patient Instructions (Addendum)
If you are age 73 or older, your body mass index should be between 23-30. Your Body mass index is 24.86 kg/m. If this is out of the aforementioned range listed, please consider follow up with your Primary Care Provider.  If you are age 12 or younger, your body mass index should be between 19-25. Your Body mass index is 24.86 kg/m. If this is out of the aformentioned range listed, please consider follow up with your Primary Care Provider.   To help prevent the possible spread of infection to our patients, communities, and staff; we will be implementing the following measures:  As of now we are not allowing any visitors/family members to accompany you to any upcoming appointments with [facility]. If you have any concerns about this please contact our office to discuss prior to the appointment.   We have given you samples of the following medication to take: Linzess 161mcg: Take once a day in the morning at least 30 minutes before your first meal.  Alternatively you can take Miralax, over the counter, as directed, twice a day.  You will be due for a recall colonoscopy in August 2022. We will send you a reminder in the mail when it gets closer to that time.  Thank you for entrusting me with your care and for choosing [facility], Dr. Worcester Cellar

## 2019-11-03 NOTE — Progress Notes (Signed)
HPI :  73 year old female with a history of DVT on chronic anticoagulation, history of pulmonary embolism, peripheral vascular disease, history of seizures, referred by Prince Solian MD to discuss colonoscopy.  Her last colonoscopy was performed in August 2015 by Dr. Maurene Capes.  She had 1 small adenoma removed and was reported to have an excellent prep.   She has some baseline constipation which continues to bother her.  She takes MiraLAX once every day and has a bowel movement perhaps once every other day to every few days.  She does have straining and constipated stools on the regimen.  She denies any blood in her stools.  No abdominal pains.  She denies any reflux symptoms, no dysphagia, no nausea or vomiting.  No weight loss.  She denies any family history of colon cancer.  She has a history of Coumadin use for DVTs.  She has held this for procedures in the past.  She does endorse that her mother had gastric cancer in her 53s and passed of this.  She has never had a prior EGD or H. pylori testing.  We discussed this for a bit.  Colonoscopy 08/07/2014 - Dr. Olevia Perches, excellent prep - one 26mm polyp removed c/w adenoma  Colonoscopy 03/02/2005 - one small polyp   Past Medical History:  Diagnosis Date  . DVT (deep venous thrombosis) ([place]) 2005   on coumadin; recurrent 2005, 2010; 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, but unable to be removed, being treated by Dr Joan Flores at [facility]  . Headache(784.0)   . History of echocardiogram 03/11/09   nl EF 60-65%  . History of recurrent UTIs    feels like may have one now  . PE (pulmonary embolism) 2005  . Peripheral vascular disease ([facility])    dvt leg rt  . Seizures ([place])      Past Surgical History:  Procedure Laterality Date  . arm surgery Right   . INSERTION OF VENA CAVA FILTER  10  . KNEE SURGERY Left 96   kneecap fx  . ORIF HUMERUS FRACTURE Right 08/01/2013   Procedure: NON-UNION REPAIR RIGHT PROXIMAL HUMERUS FRACTURE;  Surgeon: Rozanna Box, MD;  Location: [facility];  Service: Orthopedics;  Laterality: Right;   Family History  Problem Relation Age of Onset  . Diabetes Mother   . Stomach cancer Mother   . Stroke Father   . Esophageal cancer Neg Hx   . Colon cancer Neg Hx   . Liver disease Neg Hx   . Pancreatic cancer Neg Hx    Social History   Tobacco Use  . Smoking status: Never Smoker  . Smokeless tobacco: Never Used  Substance Use Topics  . Alcohol use: No  . Drug use: No   Current Outpatient Medications  Medication Sig Dispense Refill  . acetaminophen (TYLENOL) 500 MG tablet Take 500 mg by mouth every 6 (six) hours as needed.    . Calcium Citrate-Vitamin D (CALCIUM CITRATE + D PO) Take 1 tablet by mouth 2 (two) times daily with a meal.    . cetirizine (ZYRTEC) 10 MG tablet Take 10 mg by mouth daily.    . Coenzyme Q10 50 MG CAPS Take 1 capsule by mouth every morning.    Marland Kitchen HYDROcodone-acetaminophen (NORCO/VICODIN) 5-325 MG tablet Take 1 tablet by mouth every 6 (six) hours as needed for moderate pain.    Marland Kitchen lamoTRIgine (LAMICTAL) 100 MG tablet Take 50 mg by mouth 2 (two) times daily. Takes with 200mg  tablets for  a total dose of 250mg  twice a day    . lamoTRIgine (LAMICTAL) 200 MG tablet Take by mouth 2 (two) times daily. Takes with 50mg  tablets for a total dose of 250mg  twice a day    . mirtazapine (REMERON) 7.5 MG tablet Take 1 tablet by mouth at bedtime.   5  . Multiple Vitamin (MULTIVITAMIN WITH MINERALS) TABS tablet Take 1 tablet by mouth 2 (two) times daily with a meal.    . Omega-3 Fatty Acids (OMEGA 3 PO) Take 1 capsule by mouth every morning.    . warfarin (COUMADIN) 7.5 MG tablet TAKE 1 TO 1 AND 1/2 TABLETS BY MOUTH DAILY AS DIRECTED BY COUMADIN CLINIC 135 tablet 0   No current facility-administered medications for this visit.    Allergies  Allergen Reactions  . Tegretol [Carbamazepine] Rash     Review of Systems: All systems reviewed and negative except where noted in HPI.   Last Hgb 11.7 with  MCV 89 05/2019  Physical Exam: BP 120/70   Pulse 80   Temp 98.1 F (36.7 C)   Ht 5\' 6"  (1.676 m)   Wt 154 lb (69.9 kg)   BMI 24.86 kg/m  Constitutional: Pleasant,well-developed, female in no acute distress. HEENT: Normocephalic and atraumatic. Conjunctivae are normal. No scleral icterus. Neck supple.  Cardiovascular: Normal rate, regular rhythm.  Pulmonary/chest: Effort normal and breath sounds normal. No wheezing, rales or rhonchi. Abdominal: Soft, nondistended, nontender.  There are no masses palpable. No hepatomegaly. Extremities: no edema Lymphadenopathy: No cervical adenopathy noted. Neurological: Alert and oriented to person place and time. Skin: Skin is warm and dry. No rashes noted. Psychiatric: Normal mood and affect. Behavior is normal.   ASSESSMENT AND PLAN: 73 year old female here for new patient assessment the following:  Constipation - longstanding chronic constipation, MiraLAX once daily not providing effective relief.  Discussed options with her.  She can try increasing MiraLAX to twice daily or higher to see if this helps.  We also discussed other options, she was interested in trying Linzess 145 mcg once a day as she may prefer taking a pill instead of using MiraLAX.  Long-term if she prefers to use Linzess and this works for her she should let us know and we will provide her prescription.  If not she can continue MiraLAX as needed.  History of colon polyps / anticoagulated -1 small adenoma removed 5 years ago, her prep was excellent at that time.  I reviewed updated surveillance guidelines with her, which have been changed since her last exam, she is not due for another colonoscopy for 7 years from her last exam, thus in August 2022.  She agreed.  Family history of gastric cancer - her mother had gastric cancer at a very young age and she is never been screened for this with either EGD or H. pylori.  I discussed options with her.  She has no symptoms that concern her  at all.  She would be willing to have an EGD done which is reasonable given her family history, however she wants to wait until she has her colonoscopy and does not want to have anesthesia for just the EGD when she is feeling well.  I offered her H. pylori screening in the interim, we discussed what this was and she declined given she feels well.  We will plan on endoscopy and colonoscopy in August 2022 but if she has symptoms that bother her sooner or in the interim she should let us know.  She  agreed  [place] Cellar, MD [facility]  CC: Prince Solian, MD

## 2019-12-01 ENCOUNTER — Ambulatory Visit (INDEPENDENT_AMBULATORY_CARE_PROVIDER_SITE_OTHER): Payer: Self-pay | Admitting: Pharmacist

## 2019-12-01 ENCOUNTER — Other Ambulatory Visit: Payer: Self-pay

## 2019-12-01 DIAGNOSIS — I82409 Acute embolism and thrombosis of unspecified deep veins of unspecified lower extremity: Secondary | ICD-10-CM

## 2019-12-01 DIAGNOSIS — I824Y3 Acute embolism and thrombosis of unspecified deep veins of proximal lower extremity, bilateral: Secondary | ICD-10-CM

## 2019-12-01 DIAGNOSIS — Z7901 Long term (current) use of anticoagulants: Secondary | ICD-10-CM

## 2019-12-01 LAB — POCT INR: INR: 3.1 — AB (ref 2.0–3.0)

## 2020-01-12 ENCOUNTER — Other Ambulatory Visit: Payer: Self-pay

## 2020-01-12 ENCOUNTER — Ambulatory Visit (INDEPENDENT_AMBULATORY_CARE_PROVIDER_SITE_OTHER): Payer: Managed Care, Other (non HMO) | Admitting: Pharmacist Clinician (PhC)/ Clinical Pharmacy Specialist

## 2020-01-12 DIAGNOSIS — I82409 Acute embolism and thrombosis of unspecified deep veins of unspecified lower extremity: Secondary | ICD-10-CM

## 2020-01-12 DIAGNOSIS — I824Y3 Acute embolism and thrombosis of unspecified deep veins of proximal lower extremity, bilateral: Secondary | ICD-10-CM | POA: Diagnosis not present

## 2020-01-12 DIAGNOSIS — Z7901 Long term (current) use of anticoagulants: Secondary | ICD-10-CM

## 2020-01-12 LAB — POCT INR: INR: 2 (ref 2.0–3.0)

## 2020-01-27 ENCOUNTER — Other Ambulatory Visit: Payer: Self-pay | Admitting: Cardiovascular Disease

## 2020-03-04 ENCOUNTER — Other Ambulatory Visit: Payer: Self-pay

## 2020-03-04 ENCOUNTER — Ambulatory Visit (INDEPENDENT_AMBULATORY_CARE_PROVIDER_SITE_OTHER): Payer: Managed Care, Other (non HMO) | Admitting: Pharmacist

## 2020-03-04 DIAGNOSIS — Z7901 Long term (current) use of anticoagulants: Secondary | ICD-10-CM

## 2020-03-04 DIAGNOSIS — I82409 Acute embolism and thrombosis of unspecified deep veins of unspecified lower extremity: Secondary | ICD-10-CM | POA: Diagnosis not present

## 2020-03-04 LAB — POCT INR: INR: 2.5 (ref 2.0–3.0)

## 2020-05-01 ENCOUNTER — Other Ambulatory Visit: Payer: Self-pay

## 2020-05-01 ENCOUNTER — Ambulatory Visit (INDEPENDENT_AMBULATORY_CARE_PROVIDER_SITE_OTHER): Payer: Managed Care, Other (non HMO) | Admitting: Pharmacist

## 2020-05-01 DIAGNOSIS — I2699 Other pulmonary embolism without acute cor pulmonale: Secondary | ICD-10-CM | POA: Diagnosis not present

## 2020-05-01 DIAGNOSIS — Z7901 Long term (current) use of anticoagulants: Secondary | ICD-10-CM

## 2020-05-01 DIAGNOSIS — I82409 Acute embolism and thrombosis of unspecified deep veins of unspecified lower extremity: Secondary | ICD-10-CM | POA: Diagnosis not present

## 2020-05-01 LAB — POCT INR: INR: 3.5 — AB (ref 2.0–3.0)

## 2020-05-01 NOTE — Patient Instructions (Signed)
Description   Hold dose tonight then continue dose at 1 tablet daily except 1.5 tablets each Monday. Repeat INR in 3 weeks

## 2020-05-02 ENCOUNTER — Other Ambulatory Visit: Payer: Self-pay | Admitting: Cardiovascular Disease

## 2020-05-24 ENCOUNTER — Other Ambulatory Visit: Payer: Self-pay | Admitting: Pharmacist

## 2020-05-24 ENCOUNTER — Ambulatory Visit (INDEPENDENT_AMBULATORY_CARE_PROVIDER_SITE_OTHER): Payer: Managed Care, Other (non HMO) | Admitting: Pharmacist

## 2020-05-24 ENCOUNTER — Other Ambulatory Visit: Payer: Self-pay

## 2020-05-24 DIAGNOSIS — I824Y3 Acute embolism and thrombosis of unspecified deep veins of proximal lower extremity, bilateral: Secondary | ICD-10-CM

## 2020-05-24 DIAGNOSIS — Z7901 Long term (current) use of anticoagulants: Secondary | ICD-10-CM

## 2020-05-24 LAB — POCT INR: INR: 2.6 (ref 2.0–3.0)

## 2020-07-05 ENCOUNTER — Encounter: Payer: Self-pay | Admitting: Cardiovascular Disease

## 2020-07-05 ENCOUNTER — Ambulatory Visit (INDEPENDENT_AMBULATORY_CARE_PROVIDER_SITE_OTHER): Payer: Managed Care, Other (non HMO) | Admitting: Cardiovascular Disease

## 2020-07-05 ENCOUNTER — Other Ambulatory Visit: Payer: Self-pay

## 2020-07-05 ENCOUNTER — Ambulatory Visit (INDEPENDENT_AMBULATORY_CARE_PROVIDER_SITE_OTHER): Payer: Managed Care, Other (non HMO) | Admitting: Pharmacist

## 2020-07-05 VITALS — BP 126/66 | HR 75 | Ht 65.0 in | Wt 153.8 lb

## 2020-07-05 DIAGNOSIS — I824Y3 Acute embolism and thrombosis of unspecified deep veins of proximal lower extremity, bilateral: Secondary | ICD-10-CM

## 2020-07-05 DIAGNOSIS — Z7901 Long term (current) use of anticoagulants: Secondary | ICD-10-CM | POA: Diagnosis not present

## 2020-07-05 LAB — POCT INR: INR: 2.3 (ref 2.0–3.0)

## 2020-07-05 NOTE — Progress Notes (Signed)
07/05/2020 Evelyn [name]   Dec 31, 1945  [id number]  Primary Physician Prince Solian, MD Primary Cardiologist: [name] MD Lupe Carney, [place]  HPI:  Evelyn [name] is a 74 y.o. 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 Shanon Brow Hardings.  I last saw her in the office 06/22/2018.  She has a history of bilateral DVT and pulmonary emboli on Coumadin anticoagulation status post IVC filter placement. She has no other cardiac risk factors. She apparently had a hypercoagulabl  Since I saw her 2 years ago she is remained asymptomatic.  She said no further thromboembolic events.  She she remains on Coumadin and has her INR checked here in our office.  She denies chest pain or shortness of breath.   Current Meds  Medication Sig  . acetaminophen (TYLENOL) 500 MG tablet Take 500 mg by mouth every 6 (six) hours as needed.  . Calcium Citrate-Vitamin D (CALCIUM CITRATE + D PO) Take 1 tablet by mouth 2 (two) times daily with a meal.  . cetirizine (ZYRTEC) 10 MG tablet Take 10 mg by mouth daily.  . Coenzyme Q10 50 MG CAPS Take 1 capsule by mouth every morning.  Marland Kitchen HYDROcodone-acetaminophen (NORCO/VICODIN) 5-325 MG tablet Take 1 tablet by mouth every 6 (six) hours as needed for moderate pain.  Marland Kitchen lamoTRIgine (LAMICTAL) 100 MG tablet Take 50 mg by mouth 2 (two) times daily. Takes with 200mg  tablets for a total dose of 250mg  twice a day  . lamoTRIgine (LAMICTAL) 200 MG tablet Take by mouth 2 (two) times daily. Takes with 50mg  tablets for a total dose of 250mg  twice a day  . linaclotide (LINZESS) 145 MCG CAPS capsule Take 1 capsule (145 mcg total) by mouth daily before breakfast. [id number], EXP: 06-2020  . Multiple Vitamin (MULTIVITAMIN WITH MINERALS) TABS tablet Take 1 tablet by mouth 2 (two) times daily with a meal.  . Omega-3 Fatty Acids (OMEGA 3 PO) Take 1 capsule by mouth every morning.  . polyethylene  glycol (MIRALAX) 17 g packet Take 17 g by mouth 2 (two) times daily.  Marland Kitchen warfarin (COUMADIN) 7.5 MG tablet TAKE 1 TO 1 AND 1/2 TABLETS BY MOUTH DAILY AS DIRECTED BY COUMADIN CLINIC  . [DISCONTINUED] mirtazapine (REMERON) 7.5 MG tablet Take 1 tablet by mouth at bedtime.      Allergies  Allergen Reactions  . Tegretol [Carbamazepine] Rash    Social History   Socioeconomic History  . Marital status: Married    Spouse name: Not on file  . Number of children: Not on file  . Years of education: Not on file  . Highest education level: Not on file  Occupational History  . Not on file  Tobacco Use  . Smoking status: Never Smoker  . Smokeless tobacco: Never Used  Substance and Sexual Activity  . Alcohol use: No  . Drug use: No  . Sexual activity: Not on file  Other Topics Concern  . Not on file  Social History Narrative  . Not on file   Social Determinants of Health   Financial Resource Strain:   . Difficulty of Paying Living Expenses:   Food Insecurity:   . Worried About Charity fundraiser in the Last Year:   . Arboriculturist in the Last Year:   Transportation Needs:   . Film/video editor (Medical):   Marland Kitchen Lack of Transportation (Non-Medical):   Physical  Activity:   . Days of Exercise per Week:   . Minutes of Exercise per Session:   Stress:   . Feeling of Stress :   Social Connections:   . Frequency of Communication with Friends and Family:   . Frequency of Social Gatherings with Friends and Family:   . Attends Religious Services:   . Active Member of Clubs or Organizations:   . Attends Archivist Meetings:   Marland Kitchen Marital Status:   Intimate Partner Violence:   . Fear of Current or Ex-Partner:   . Emotionally Abused:   Marland Kitchen Physically Abused:   . Sexually Abused:      Review of Systems: General: negative for chills, fever, night sweats or weight changes.  Cardiovascular: negative for chest pain, dyspnea on exertion, edema, orthopnea, palpitations, paroxysmal  nocturnal dyspnea or shortness of breath Dermatological: negative for rash Respiratory: negative for cough or wheezing Urologic: negative for hematuria Abdominal: negative for nausea, vomiting, diarrhea, bright red blood per rectum, melena, or hematemesis Neurologic: negative for visual changes, syncope, or dizziness All other systems reviewed and are otherwise negative except as noted above.    Blood pressure 126/66, pulse 75, height 5\' 5"  (1.651 m), weight 153 lb 12.8 oz (69.8 kg).  General appearance: alert and no distress Neck: no adenopathy, no carotid bruit, no JVD, supple, symmetrical, trachea midline and thyroid not enlarged, symmetric, no tenderness/mass/nodules Lungs: clear to auscultation bilaterally Heart: regular rate and rhythm, S1, S2 normal, no murmur, click, rub or gallop Extremities: extremities normal, atraumatic, no cyanosis or edema Pulses: 2+ and symmetric Skin: Skin color, texture, turgor normal. No rashes or lesions Neurologic: Alert and oriented X 3, normal strength and tone. Normal symmetric reflexes. Normal coordination and gait  EKG sinus rhythm at 75 without ST or T wave changes.  I personally reviewed this EKG.  ASSESSMENT AND PLAN:   DVT (deep venous thrombosis) Ms. Ohlsen returns today reporting seen 2 years ago.  She most likely has a hypercoagulable state on Coumadin oral anticoagulation with history of prior DVTs, pulmonary emboli status post IVC filter placement.  Her INRs are checked here in our office.  She said no further thrombotic events.      [name] MD FACP,FACC,FAHA, [facility] 07/05/2020 4:37 PM

## 2020-07-05 NOTE — Patient Instructions (Signed)
Medication Instructions:  NO CHANGE *If you need a refill on your cardiac medications before your next appointment, please call your pharmacy*   Lab Work: If you have labs (blood work) drawn today and your tests are completely normal, you will receive your results only by: Marland Kitchen MyChart Message (if you have MyChart) OR . A paper copy in the mail If you have any lab test that is abnormal or we need to change your treatment, we will call you to review the results   Follow-Up: At [facility], you and your health needs are our priority.  As part of our continuing mission to provide you with exceptional heart care, we have created designated Provider Care Teams.  These Care Teams include your primary Cardiologist (physician) and Advanced Practice Providers (APPs -  Physician Assistants and Nurse Practitioners) who all work together to provide you with the care you need, when you need it.  We recommend signing up for the patient portal called "MyChart".  Sign up information is provided on this After Visit Summary.  MyChart is used to connect with patients for Virtual Visits (Telemedicine).  Patients are able to view lab/test results, encounter notes, upcoming appointments, etc.  Non-urgent messages can be sent to your provider as well.   To learn more about what you can do with MyChart, go to [email or web address].    Your next appointment:   2 year(s)  The format for your next appointment:   In Person  Provider:   You may see Quay Burow MD or one of the following Advanced Practice Providers on your designated Care Team:    Kerin Ransom, PA-C  [place], [place]  Coletta Memos, [place]

## 2020-07-05 NOTE — Assessment & Plan Note (Signed)
Evelyn [name] returns today reporting seen 2 years ago.  She most likely has a hypercoagulable state on Coumadin oral anticoagulation with history of prior DVTs, pulmonary emboli status post IVC filter placement.  Her INRs are checked here in our office.  She said no further thrombotic events.

## 2020-08-01 ENCOUNTER — Other Ambulatory Visit: Payer: Self-pay | Admitting: Cardiovascular Disease

## 2020-08-21 ENCOUNTER — Telehealth: Payer: Self-pay

## 2020-08-21 NOTE — Telephone Encounter (Signed)
Unable to lmom for overdue inr 

## 2020-08-26 ENCOUNTER — Other Ambulatory Visit: Payer: Self-pay

## 2020-08-26 ENCOUNTER — Other Ambulatory Visit: Payer: Self-pay | Admitting: Cardiovascular Disease

## 2020-08-26 MED ORDER — WARFARIN SODIUM 7.5 MG PO TABS
7.5000 mg | ORAL_TABLET | Freq: Every day | ORAL | 0 refills | Status: DC
Start: 2020-08-26 — End: 2021-02-27

## 2020-08-27 ENCOUNTER — Telehealth: Payer: Self-pay

## 2020-08-27 NOTE — Telephone Encounter (Signed)
Unable to lmom for overdue inr 

## 2020-09-05 ENCOUNTER — Telehealth: Payer: Self-pay

## 2020-09-05 NOTE — Telephone Encounter (Signed)
Unable to lmom for overdue inr 

## 2020-09-27 ENCOUNTER — Ambulatory Visit (INDEPENDENT_AMBULATORY_CARE_PROVIDER_SITE_OTHER): Payer: Managed Care, Other (non HMO)

## 2020-09-27 ENCOUNTER — Other Ambulatory Visit: Payer: Self-pay

## 2020-09-27 DIAGNOSIS — I824Y9 Acute embolism and thrombosis of unspecified deep veins of unspecified proximal lower extremity: Secondary | ICD-10-CM | POA: Insufficient documentation

## 2020-09-27 DIAGNOSIS — I4891 Unspecified atrial fibrillation: Secondary | ICD-10-CM | POA: Insufficient documentation

## 2020-09-27 DIAGNOSIS — Z7901 Long term (current) use of anticoagulants: Secondary | ICD-10-CM | POA: Diagnosis not present

## 2020-09-27 DIAGNOSIS — I824Y3 Acute embolism and thrombosis of unspecified deep veins of proximal lower extremity, bilateral: Secondary | ICD-10-CM

## 2020-09-27 HISTORY — DX: Acute embolism and thrombosis of unspecified deep veins of unspecified proximal lower extremity: I82.4Y9

## 2020-09-27 LAB — POCT INR: INR: 2 (ref 2.0–3.0)

## 2020-09-27 NOTE — Patient Instructions (Signed)
Take 1 tablet daily except 1.5 tablets on Monday.  Repeat INR in 4 weeks.

## 2020-10-25 ENCOUNTER — Ambulatory Visit (INDEPENDENT_AMBULATORY_CARE_PROVIDER_SITE_OTHER): Payer: Managed Care, Other (non HMO)

## 2020-10-25 ENCOUNTER — Other Ambulatory Visit: Payer: Self-pay

## 2020-10-25 DIAGNOSIS — I824Y3 Acute embolism and thrombosis of unspecified deep veins of proximal lower extremity, bilateral: Secondary | ICD-10-CM

## 2020-10-25 DIAGNOSIS — Z7901 Long term (current) use of anticoagulants: Secondary | ICD-10-CM

## 2020-10-25 LAB — POCT INR: INR: 1.6 — AB (ref 2.0–3.0)

## 2020-10-25 NOTE — Patient Instructions (Signed)
Take 2 tablets tonight and then continue taking 1 tablet daily except 1.5 tablets on Monday.  Repeat INR in 4 weeks.

## 2020-11-11 DIAGNOSIS — G9389 Other specified disorders of brain: Secondary | ICD-10-CM | POA: Insufficient documentation

## 2020-11-25 ENCOUNTER — Ambulatory Visit (INDEPENDENT_AMBULATORY_CARE_PROVIDER_SITE_OTHER): Payer: Managed Care, Other (non HMO)

## 2020-11-25 ENCOUNTER — Other Ambulatory Visit: Payer: Self-pay

## 2020-11-25 DIAGNOSIS — Z7901 Long term (current) use of anticoagulants: Secondary | ICD-10-CM

## 2020-11-25 DIAGNOSIS — I824Y3 Acute embolism and thrombosis of unspecified deep veins of proximal lower extremity, bilateral: Secondary | ICD-10-CM

## 2020-11-25 LAB — POCT INR: INR: 3.1 — AB (ref 2.0–3.0)

## 2020-11-25 NOTE — Patient Instructions (Signed)
continue taking 1 tablet daily except 1.5 tablets on Monday.  Repeat INR in 6 weeks. Eat greens over the next 2 days only.

## 2021-01-10 ENCOUNTER — Other Ambulatory Visit: Payer: Self-pay

## 2021-01-10 ENCOUNTER — Ambulatory Visit (INDEPENDENT_AMBULATORY_CARE_PROVIDER_SITE_OTHER): Payer: Managed Care, Other (non HMO)

## 2021-01-10 DIAGNOSIS — Z7901 Long term (current) use of anticoagulants: Secondary | ICD-10-CM | POA: Diagnosis not present

## 2021-01-10 DIAGNOSIS — I824Y3 Acute embolism and thrombosis of unspecified deep veins of proximal lower extremity, bilateral: Secondary | ICD-10-CM | POA: Diagnosis not present

## 2021-01-10 LAB — POCT INR: INR: 3.2 — AB (ref 2.0–3.0)

## 2021-01-10 NOTE — Patient Instructions (Signed)
Hold tonight only and then continue taking 1 tablet daily except 1.5 tablets on Monday.  Repeat INR in 6 weeks

## 2021-02-27 ENCOUNTER — Other Ambulatory Visit: Payer: Self-pay | Admitting: Cardiovascular Disease

## 2021-02-28 ENCOUNTER — Ambulatory Visit (INDEPENDENT_AMBULATORY_CARE_PROVIDER_SITE_OTHER): Payer: Managed Care, Other (non HMO)

## 2021-02-28 ENCOUNTER — Other Ambulatory Visit: Payer: Self-pay

## 2021-02-28 DIAGNOSIS — Z7901 Long term (current) use of anticoagulants: Secondary | ICD-10-CM | POA: Diagnosis not present

## 2021-02-28 DIAGNOSIS — I824Y3 Acute embolism and thrombosis of unspecified deep veins of proximal lower extremity, bilateral: Secondary | ICD-10-CM

## 2021-02-28 LAB — POCT INR: INR: 2.6 (ref 2.0–3.0)

## 2021-02-28 NOTE — Patient Instructions (Addendum)
continue taking 1 tablet daily except 1.5 tablets on Monday.  Repeat INR in 6 weeks.  [phone]  

## 2021-04-11 ENCOUNTER — Other Ambulatory Visit: Payer: Self-pay

## 2021-04-11 ENCOUNTER — Ambulatory Visit (INDEPENDENT_AMBULATORY_CARE_PROVIDER_SITE_OTHER): Payer: Managed Care, Other (non HMO)

## 2021-04-11 DIAGNOSIS — I824Y3 Acute embolism and thrombosis of unspecified deep veins of proximal lower extremity, bilateral: Secondary | ICD-10-CM

## 2021-04-11 DIAGNOSIS — Z7901 Long term (current) use of anticoagulants: Secondary | ICD-10-CM

## 2021-04-11 LAB — POCT INR: INR: 2.2 (ref 2.0–3.0)

## 2021-04-11 NOTE — Patient Instructions (Signed)
continue taking 1 tablet daily except 1.5 tablets on Monday.  Repeat INR in 6 weeks.

## 2021-05-30 ENCOUNTER — Other Ambulatory Visit: Payer: Self-pay

## 2021-05-30 ENCOUNTER — Ambulatory Visit (INDEPENDENT_AMBULATORY_CARE_PROVIDER_SITE_OTHER): Payer: Managed Care, Other (non HMO)

## 2021-05-30 DIAGNOSIS — Z7901 Long term (current) use of anticoagulants: Secondary | ICD-10-CM

## 2021-05-30 DIAGNOSIS — I824Y3 Acute embolism and thrombosis of unspecified deep veins of proximal lower extremity, bilateral: Secondary | ICD-10-CM | POA: Diagnosis not present

## 2021-05-30 LAB — POCT INR: INR: 3.5 — AB (ref 2.0–3.0)

## 2021-05-30 NOTE — Patient Instructions (Signed)
Hold tonight only and then continue taking 1 tablet daily except 1.5 tablets on Monday.  Repeat INR in 6 weeks.  [phone]

## 2021-06-27 ENCOUNTER — Other Ambulatory Visit: Payer: Self-pay | Admitting: Cardiovascular Disease

## 2021-07-11 ENCOUNTER — Other Ambulatory Visit: Payer: Self-pay

## 2021-07-11 ENCOUNTER — Ambulatory Visit (INDEPENDENT_AMBULATORY_CARE_PROVIDER_SITE_OTHER): Payer: Managed Care, Other (non HMO)

## 2021-07-11 DIAGNOSIS — Z7901 Long term (current) use of anticoagulants: Secondary | ICD-10-CM

## 2021-07-11 DIAGNOSIS — I824Y3 Acute embolism and thrombosis of unspecified deep veins of proximal lower extremity, bilateral: Secondary | ICD-10-CM | POA: Diagnosis not present

## 2021-07-11 LAB — POCT INR: INR: 1.4 — AB (ref 2.0–3.0)

## 2021-07-11 NOTE — Patient Instructions (Signed)
Take 2 tablets tonight only and then continue taking 1 tablet daily except 1.5 tablets on Monday.  Repeat INR in 2 weeks.  [phone]

## 2021-07-25 ENCOUNTER — Other Ambulatory Visit: Payer: Self-pay

## 2021-07-25 ENCOUNTER — Ambulatory Visit (INDEPENDENT_AMBULATORY_CARE_PROVIDER_SITE_OTHER): Payer: Managed Care, Other (non HMO)

## 2021-07-25 DIAGNOSIS — I824Y3 Acute embolism and thrombosis of unspecified deep veins of proximal lower extremity, bilateral: Secondary | ICD-10-CM | POA: Diagnosis not present

## 2021-07-25 DIAGNOSIS — Z7901 Long term (current) use of anticoagulants: Secondary | ICD-10-CM

## 2021-07-25 LAB — POCT INR: INR: 2.7 (ref 2.0–3.0)

## 2021-07-25 NOTE — Patient Instructions (Signed)
continue taking 1 tablet daily except 1.5 tablets on Monday.  Repeat INR in 6 weeks.  [phone]

## 2021-08-01 ENCOUNTER — Other Ambulatory Visit: Payer: Self-pay

## 2021-08-01 ENCOUNTER — Encounter (HOSPITAL_COMMUNITY): Payer: Self-pay | Admitting: Emergency Medicine

## 2021-08-01 ENCOUNTER — Telehealth: Payer: Self-pay | Admitting: Surgery

## 2021-08-01 ENCOUNTER — Emergency Department (HOSPITAL_COMMUNITY): Payer: Managed Care, Other (non HMO)

## 2021-08-01 ENCOUNTER — Emergency Department (HOSPITAL_COMMUNITY)
Admission: EM | Admit: 2021-08-01 | Discharge: 2021-08-01 | Disposition: A | Discharge status: Home / Self Care | Payer: Managed Care, Other (non HMO) | Attending: Emergency Medicine | Admitting: Emergency Medicine

## 2021-08-01 DIAGNOSIS — R2681 Unsteadiness on feet: Secondary | ICD-10-CM

## 2021-08-01 DIAGNOSIS — Y92009 Unspecified place in unspecified non-institutional (private) residence as the place of occurrence of the external cause: Secondary | ICD-10-CM | POA: Insufficient documentation

## 2021-08-01 DIAGNOSIS — W01198A Fall on same level from slipping, tripping and stumbling with subsequent striking against other object, initial encounter: Secondary | ICD-10-CM | POA: Diagnosis not present

## 2021-08-01 DIAGNOSIS — Z20822 Contact with and (suspected) exposure to covid-19: Secondary | ICD-10-CM | POA: Diagnosis not present

## 2021-08-01 DIAGNOSIS — G912 (Idiopathic) normal pressure hydrocephalus: Secondary | ICD-10-CM | POA: Insufficient documentation

## 2021-08-01 DIAGNOSIS — Z86718 Personal history of other venous thrombosis and embolism: Secondary | ICD-10-CM | POA: Insufficient documentation

## 2021-08-01 DIAGNOSIS — M6281 Muscle weakness (generalized): Secondary | ICD-10-CM

## 2021-08-01 DIAGNOSIS — Z7901 Long term (current) use of anticoagulants: Secondary | ICD-10-CM | POA: Diagnosis not present

## 2021-08-01 LAB — COMPREHENSIVE METABOLIC PANEL
ALT: 12 U/L (ref 0–44)
AST: 30 U/L (ref 15–41)
Albumin: 3.7 g/dL (ref 3.5–5.0)
Alkaline Phosphatase: 100 U/L (ref 38–126)
Anion gap: 8 (ref 5–15)
BUN: 17 mg/dL (ref 8–23)
CO2: 27 mmol/L (ref 22–32)
Calcium: 9.7 mg/dL (ref 8.9–10.3)
Chloride: 103 mmol/L (ref 98–111)
Creatinine, Ser: 0.78 mg/dL (ref 0.44–1.00)
GFR, Estimated: >60 mL/min (ref >60)
Glucose, Bld: 108 mg/dL — ABNORMAL HIGH (ref 70–99)
Potassium: 4.2 mmol/L (ref 3.5–5.1)
Sodium: 138 mmol/L (ref 135–145)
Total Bilirubin: 0.4 mg/dL (ref 0.3–1.2)
Total Protein: 7.7 g/dL (ref 6.5–8.1)

## 2021-08-01 LAB — CBC WITH DIFFERENTIAL/PLATELET
Abs Immature Granulocytes: 0.01 10*3/uL (ref 0.00–0.07)
Basophils Absolute: 0.1 10*3/uL (ref 0.0–0.1)
Basophils Relative: 1 %
Eosinophils Absolute: 0.2 10*3/uL (ref 0.0–0.5)
Eosinophils Relative: 3 %
HCT: 37.1 % (ref 36.0–46.0)
Hemoglobin: 11.7 g/dL — ABNORMAL LOW (ref 12.0–15.0)
Immature Granulocytes: 0 %
Lymphocytes Relative: 21 %
Lymphs Abs: 1.4 10*3/uL (ref 0.7–4.0)
MCH: 27.5 pg (ref 26.0–34.0)
MCHC: 31.5 g/dL (ref 30.0–36.0)
MCV: 87.1 fL (ref 80.0–100.0)
Monocytes Absolute: 0.4 10*3/uL (ref 0.1–1.0)
Monocytes Relative: 6 %
Neutro Abs: 4.7 10*3/uL (ref 1.7–7.7)
Neutrophils Relative %: 69 %
Platelets: 244 10*3/uL (ref 150–400)
RBC: 4.26 MIL/uL (ref 3.87–5.11)
RDW: 14.5 % (ref 11.5–15.5)
WBC: 6.8 10*3/uL (ref 4.0–10.5)
nRBC: 0 % (ref 0.0–0.2)

## 2021-08-01 LAB — SARS CORONAVIRUS 2 (TAT 6-24 HRS): SARS Coronavirus 2: NEGATIVE

## 2021-08-01 LAB — TSH: TSH: 2.567 u[IU]/mL (ref 0.350–4.500)

## 2021-08-01 LAB — PROTIME-INR
INR: 3.5 — ABNORMAL HIGH (ref 0.8–1.2)
Prothrombin Time: 35.4 seconds — ABNORMAL HIGH (ref 11.4–15.2)

## 2021-08-01 NOTE — Discharge Instructions (Addendum)
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  However this will need to be evaluated further by your neurologist in [place] to determine possible intervention.  Please use a walker when ambulating to prevent risk of falling.  I have also request for physical therapy and Occupational Therapy to reach out to you to provide additional assistance.  Return to the ER if you have any concern.

## 2021-08-01 NOTE — ED Triage Notes (Signed)
Pt here from Dr office for fall 2 days ago. Pt reports intermittent bilateral leg weakness. 2 days ago she was at home and states her "legs gave out", fell backwards, hit head on carpet. Is on Coumadin for hx DVT. aox4

## 2021-08-01 NOTE — ED Provider Notes (Signed)
Emergency Medicine Provider Triage Evaluation Note  Evelyn [name] , a 75 y.o. female  was evaluated in triage.  Pt complains of weakness for approximately 7 to 6 days.  Seems to be primarily affecting her ability to walk.  Seems to have weakness of bilateral legs.  Is any fevers or chills.  Did strike her head and is on Coumadin.  Review of Systems  Positive: Leg weakness, head injury, headache Negative: Fever  Physical Exam  BP (!) 148/75 (BP Location: Right Arm)   Pulse 86   Temp 98.2 F (36.8 C) (Oral)   Resp 16   Ht '5\' 4"'$  (1.626 m)   Wt 70.3 kg   SpO2 98%   BMI 26.61 kg/m  Gen:   Awake, no distress   Resp:  Normal effort  MSK:   Moves extremities without difficulty  Other:    Medical Decision Making  Medically screening exam initiated at 3:36 PM.  Appropriate orders placed.  [name] was informed that the remainder of the evaluation will be completed by another provider, this initial triage assessment does not replace that evaluation, and the importance of remaining in the ED until their evaluation is complete.  Diffusely weak -seems to be severe and new onset.  Will obtain thyroid panel, basic labs, INR, CT head and EKG.   Tedd Sias, [place] 08/01/21 2002    Luna Fuse, MD 08/05/21 [phone]

## 2021-08-01 NOTE — ED Notes (Signed)
Provider at bedside at this time

## 2021-08-01 NOTE — Progress Notes (Signed)
Brief Neuro Update:  I spoke to patient and her husband at the bedside to evaluate her for potential NPH. On my brief discussion with them ,she does appear to have concern for executive dysfunction and per husband walks slowly and cautiously. She does not endorse urinary urgency or incontinence but she does wear depends. While discussing with them, they mention that she is already established with an independent neurologist in [place] and had an MRI last year for concern for NPH. I discussed mechanism of potential NPH and its usual symptoms. 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 They are interested in having this being worked up by her outpatient neurologist. I do not see any obvious reason to bring her in for evaluation for NPH inpatient specially since they are already established with a neurologist. Husband will reach out to the neurologist Dr. Selinda Flavin tomorrow for an appointment. I did discuss with him that we will be happy to assess and they can come back to the ED if needed.  Plan discussed with Domenic Moras with the ED team in person.  No charge for this note. I did not examine the patient just spoke to them in person.  [place] Pager Number [phone]

## 2021-08-01 NOTE — ED Notes (Signed)
Received verbal report from Alana B RN at this time °

## 2021-08-01 NOTE — ED Notes (Addendum)
CM at bedside

## 2021-08-01 NOTE — ED Notes (Signed)
Pt on bedpan at this time trying to urinate. Per pt she is unable to urinate with the bedpan or the purewick. Advised pt due to her being here for a fall we can't just let her ambulated to the restroom

## 2021-08-01 NOTE — Telephone Encounter (Signed)
ED RNCM received discharge consult for assisting with arranging OP PT, ED CM met with patient and spouse to discuss the recommendations. Both patient and husband are agreeable. Referral sent via Epic to [facility].  Updated patient that the office will contact them to set up the initial appointment within 1-2 business days of discharge. Updated EDP as well. Patient is being discharged how and transported via private vehicle 

## 2021-08-01 NOTE — ED Provider Notes (Signed)
[facility] EMERGENCY DEPARTMENT Provider Note   CSN: [id number] Arrival date & time: 08/01/21  1517     History Chief Complaint  Patient presents with   Fall   Weakness    Evelyn [name] is a 75 y.o. female.  The history is provided by the patient, the spouse and medical records. No language interpreter was used.  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  History obtained through patient and through husband who is at bedside.  4 days ago patient reports she was walking around her house try to look for her fall and her leg gave out on her.  States that she fell her left knee may have buckled and she fell to the ground.  She did struck the back of her head but denies any loss of consciousness.  She however was unable to stand up and having to scoot around the floor for approximately 2 hours until her husband came home to help her up.  She mentions since then she has been using a walker to move about.  She mentions sometimes her legs feels weak and as if she is going to fall and other times she feels fine and able to ambulate.  Today while using the walker, she cough her husband because she felt her leg was going to give out on her.  She does not complain of any significant headache, neck pain, chest pain, trouble breathing, abdominal pain, hip pain or back pain she denies any focal numbness or focal weakness.  She denies increased confusion.  She has been eating and drinking fine.  She has been taking her medication as prescribed.  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, but unable to be removed, being treated by Dr Joan Flores at [facility])    History of echocardiogram 03/11/09   nl EF 60-65%   History of recurrent  UTIs    feels like may have one now   PE (pulmonary embolism) 2005   Peripheral vascular disease ([place])    dvt leg rt   Seizures ([place])     Patient Active Problem List   Diagnosis Date Noted   Atrial fibrillation ([place]) 09/27/2020   Acute venous embolism and thrombosis of deep vessels of proximal lower extremity ([place]) 09/27/2020   Encounter for screening colonoscopy 07/13/2014   Chronic anticoagulation 07/13/2014   Acute pulmonary embolism ([place]) 05/10/2013   DVT (deep venous thrombosis), unspecified laterality 03/01/2013   DVT (deep venous thrombosis) ([place])     Past Surgical History:  Procedure Laterality Date   arm surgery Right    INSERTION OF VENA CAVA FILTER  10   KNEE SURGERY Left 96   kneecap fx   ORIF HUMERUS FRACTURE Right 08/01/2013   Procedure: NON-UNION REPAIR RIGHT PROXIMAL HUMERUS FRACTURE;  Surgeon: Rozanna Box, MD;  Location: [place];  Service: Orthopedics;  Laterality: Right;     OB History   No obstetric history on file.     Family History  Problem Relation Age of Onset   Diabetes Mother    Stomach cancer Mother    Stroke Father    Esophageal cancer Neg Hx    Colon cancer Neg Hx    Liver disease Neg Hx  Pancreatic cancer Neg Hx     Social History   Tobacco Use   Smoking status: Never   Smokeless tobacco: Never  Substance Use Topics   Alcohol use: No   Drug use: No    Home Medications Prior to Admission medications   Medication Sig Start Date End Date Taking? Authorizing Provider  acetaminophen (TYLENOL) 500 MG tablet Take 500 mg by mouth every 6 (six) hours as needed.    [provider]  Calcium Citrate-Vitamin D (CALCIUM CITRATE + D PO) Take 1 tablet by mouth 2 (two) times daily with a meal.    [provider]  cetirizine (ZYRTEC) 10 MG tablet Take 10 mg by mouth daily.    [provider]  Coenzyme Q10 50 MG CAPS Take 1 capsule by mouth every morning.    [provider]  Cyanocobalamin (B-12) 2000 MCG  TABS Take 1 tablet by mouth daily. 06/13/20   [provider]  HYDROcodone-acetaminophen (NORCO/VICODIN) 5-325 MG tablet Take 1 tablet by mouth every 6 (six) hours as needed for moderate pain.    [provider]  lamoTRIgine (LAMICTAL) 100 MG tablet Take 50 mg by mouth 2 (two) times daily. Takes with '200mg'$  tablets for a total dose of '250mg'$  twice a day    [provider]  lamoTRIgine (LAMICTAL) 200 MG tablet Take by mouth 2 (two) times daily. Takes with '50mg'$  tablets for a total dose of '250mg'$  twice a day    [provider]  linaclotide Rolan Lipa) 145 MCG CAPS capsule Take 1 capsule (145 mcg total) by mouth daily before breakfast. [id number], EXP: 06-2020 11/03/19   Armbruster, Carlota Raspberry, MD  Multiple Vitamin (MULTIVITAMIN WITH MINERALS) TABS tablet Take 1 tablet by mouth 2 (two) times daily with a meal.    [provider]  Omega-3 Fatty Acids (OMEGA 3 PO) Take 1 capsule by mouth every morning.    [provider]  polyethylene glycol (MIRALAX) 17 g packet Take 17 g by mouth 2 (two) times daily. 11/03/19   Armbruster, Carlota Raspberry, MD  warfarin (COUMADIN) 7.5 MG tablet TAKE 1 to 2 TABLETS DAILY OR AS DIRECTED BY COUMADIN CLINIC. NEED APPOINTMENT 06/27/21   [name], MD    Allergies    Tegretol [carbamazepine]  Review of Systems   Review of Systems  Neurological:  Positive for weakness.  All other systems reviewed and are negative.  Physical Exam Updated Vital Signs BP (!) 148/75 (BP Location: Right Arm)   Pulse 86   Temp 98.2 F (36.8 C) (Oral)   Resp 16   Ht '5\' 4"'$  (1.626 m)   Wt 70.3 kg   SpO2 98%   BMI 26.61 kg/m   Physical Exam Vitals and nursing note reviewed.  Constitutional:      General: She is not in acute distress.    Appearance: She is well-developed.  HENT:     Head: Normocephalic and atraumatic.     Comments: No scalp tenderness no signs of head injury Eyes:     Extraocular Movements: Extraocular movements intact.      Conjunctiva/sclera: Conjunctivae normal.     Pupils: Pupils are equal, round, and reactive to light.  Cardiovascular:     Rate and Rhythm: Normal rate and regular rhythm.     Pulses: Normal pulses.     Heart sounds: Normal heart sounds.  Pulmonary:     Effort: Pulmonary effort is normal.     Breath sounds: No wheezing, rhonchi or rales.  Abdominal:  Palpations: Abdomen is soft.     Tenderness: There is no abdominal tenderness.  Musculoskeletal:     Cervical back: Normal range of motion and neck supple. No rigidity or tenderness.     Comments: 5 out of 5 strength to bilateral lower extremities.  Bilateral knees nontender to palpation with normal flexion extension.  Normal range of motion throughout bilateral ankle, pedal pulse palpable, brisk cap refill, sensation intact to lower extremities  Skin:    Findings: No rash.  Neurological:     Mental Status: She is alert and oriented to person, place, and time.     GCS: GCS eye subscore is 4. GCS verbal subscore is 5. GCS motor subscore is 6.     Cranial Nerves: Cranial nerves are intact.     Sensory: Sensation is intact.     Motor: Motor function is intact.  Psychiatric:        Mood and Affect: Mood normal.    ED Results / Procedures / Treatments   Labs (all labs ordered are listed, but only abnormal results are displayed) Labs Reviewed  CBC WITH DIFFERENTIAL/PLATELET - Abnormal; Notable for the following components:      Result Value   Hemoglobin 11.7 (*)    All other components within normal limits  COMPREHENSIVE METABOLIC PANEL - Abnormal; Notable for the following components:   Glucose, Bld 108 (*)    All other components within normal limits  PROTIME-INR - Abnormal; Notable for the following components:   Prothrombin Time 35.4 (*)    INR 3.5 (*)    All other components within normal limits  SARS CORONAVIRUS 2 (TAT 6-24 HRS)  TSH  URINALYSIS, ROUTINE W REFLEX MICROSCOPIC  LAMOTRIGINE LEVEL  TOPIRAMATE LEVEL     EKG None  Radiology CT HEAD WO CONTRAST (5MM)  Result Date: 08/01/2021 CLINICAL DATA:  Patient fell 2 days ago. Intermittent bilateral leg weakness. Patient is on Coumadin. EXAM: CT HEAD WITHOUT CONTRAST TECHNIQUE: Contiguous axial images were obtained from the base of the skull through the vertex without intravenous contrast. COMPARISON:  12/29/2015 FINDINGS: Brain: Ventricular dilatation which is out of proportion to the degree of cerebral atrophy. Consider normal pressure hydrocephalus in the appropriate clinical setting. No mass-effect or midline shift. No abnormal extra-axial fluid collections. Gray-white matter junctions are distinct. Basal cisterns are not effaced. No acute intracranial hemorrhage. Vascular: Moderate intracranial arterial vascular calcifications. Skull: Calvarium appears intact. Sinuses/Orbits: Paranasal sinuses and mastoid air cells are clear. Other: None. IMPRESSION: Ventricular dilatation out of proportion to the degree of cerebral atrophy. Changes could be due to asymmetrical central atrophy but consider normal pressure hydrocephalus if in the appropriate clinical setting. No acute intracranial hemorrhage. Electronically Signed   By: Lucienne Capers M.D.   On: 08/01/2021 17:44    Procedures Procedures   Medications Ordered in ED Medications - No data to display  ED Course  I have reviewed the triage vital signs and the nursing notes.  Pertinent labs & imaging results that were available during my care of the patient were reviewed by me and considered in my medical decision making (see chart for details).    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  She is having trouble with leg weakness since.  On exam, she is a bit unsteady on her gait but was able to get off out of bed without assistance.  She is alert and oriented but was having difficulty remembering the month, current president and answer some basic questions.  She does not have any focal neurodeficit on exam.  Patient without any significant signs of injury as well.  As she does have significant history of PE/DVT and have been on Coumadin.  Last result shows an INR of 3.5.  Head CT scan demonstrate ventricular dilatation style proportion to the degree of cerebral atrophy.  Change could be due to asymmetrical central atrophy but consider normal pressure hydrocephalus if in the appropriate clinical setting.  No acute intracranial hemorrhage.  I discussed care with Dr. Pearline Cables, we felt that her symptoms may be attributed to normal pressure hydrocephalus.  Plan to consult neurology for recommendation.  Patient does have remote history of epilepsy more than a decade ago but currently on Lamictal.  She does have a neurologist in [place].  7:08 PM Appreciate consultation from on-call neurologist, Dr. Leonel Ramsay, who did review patient's previous imaging and felt that her ventricle was dilated previously although at this time it appears slightly more prominent.  He is however concerned that patient may have Lamictal toxicity as she is currently consuming a fairly high amount of Lamictal.  He will have his partner to evaluate patient.  8:39 PM Appreciate consultation from neurologist Dr. Lorrin Goodell, who has evaluated patient as well as discussed care with patient's husband who was in the room.  Patient symptoms could be related to NPH however it will require quite a bit more additional testing evaluation to determine the source of her complaint.  After discussion, family will prefer  for patient to follow-up closely with her neurologist in [place] who knows her well.  Family felt patient is stable to be discharged home, husband will be involved in her daily activity.  I have also request for PT OT and transition of care to be involved in patient and provide outpatient support.  Recommend return if symptoms worsen.  Level of topiramate and lamotrigine have been ordered.  At this time patient stable for discharge.   Domenic Moras, PA-C [id number]    Lianne Cure, DO [id number] [phone]

## 2021-08-04 LAB — TOPIRAMATE LEVEL: Topiramate Lvl: <1.5 ug/mL — ABNORMAL LOW (ref 2.0–25.0)

## 2021-08-04 LAB — LAMOTRIGINE LEVEL: Lamotrigine Lvl: 19.6 ug/mL (ref 2.0–20.0)

## 2021-08-15 ENCOUNTER — Other Ambulatory Visit: Payer: Self-pay

## 2021-08-15 ENCOUNTER — Ambulatory Visit (INDEPENDENT_AMBULATORY_CARE_PROVIDER_SITE_OTHER): Payer: Managed Care, Other (non HMO)

## 2021-08-15 DIAGNOSIS — Z7901 Long term (current) use of anticoagulants: Secondary | ICD-10-CM

## 2021-08-15 DIAGNOSIS — I824Y3 Acute embolism and thrombosis of unspecified deep veins of proximal lower extremity, bilateral: Secondary | ICD-10-CM

## 2021-08-15 LAB — POCT INR: INR: 3.9 — AB (ref 2.0–3.0)

## 2021-08-15 NOTE — Patient Instructions (Signed)
Hold today and take 0.5 tablet Saturday only and then continue taking 1 tablet daily except 1.5 tablets on Monday.  Repeat INR in 1 week.  [phone]

## 2021-08-22 ENCOUNTER — Other Ambulatory Visit: Payer: Self-pay

## 2021-08-22 ENCOUNTER — Ambulatory Visit (INDEPENDENT_AMBULATORY_CARE_PROVIDER_SITE_OTHER): Payer: Managed Care, Other (non HMO) | Admitting: *Deleted

## 2021-08-22 DIAGNOSIS — Z7901 Long term (current) use of anticoagulants: Secondary | ICD-10-CM | POA: Diagnosis not present

## 2021-08-22 DIAGNOSIS — I824Y3 Acute embolism and thrombosis of unspecified deep veins of proximal lower extremity, bilateral: Secondary | ICD-10-CM

## 2021-08-22 LAB — POCT INR: INR: 2.3 (ref 2.0–3.0)

## 2021-08-22 NOTE — Patient Instructions (Addendum)
Description   Continue to take warfarin 1 tablet daily except for 1.5 tablets on Monday. Recheck INR in 2 weeks. Be consistent with your vitamin K intake.  [facility] [phone]

## 2021-09-11 ENCOUNTER — Other Ambulatory Visit: Payer: Self-pay

## 2021-09-11 ENCOUNTER — Ambulatory Visit: Payer: Managed Care, Other (non HMO) | Admitting: *Deleted

## 2021-09-11 DIAGNOSIS — Z7901 Long term (current) use of anticoagulants: Secondary | ICD-10-CM

## 2021-09-11 DIAGNOSIS — I824Y3 Acute embolism and thrombosis of unspecified deep veins of proximal lower extremity, bilateral: Secondary | ICD-10-CM | POA: Diagnosis not present

## 2021-09-11 LAB — POCT INR: INR: 5.1 — AB (ref 2.0–3.0)

## 2021-09-11 NOTE — Patient Instructions (Signed)
Description   Hold warfarin today and tomorrow and then start taking warfarin 1 tablet daily. Recheck INR in 1week. Coumadin Clinic [phone]

## 2021-09-17 DIAGNOSIS — R413 Other amnesia: Secondary | ICD-10-CM | POA: Diagnosis present

## 2021-09-19 ENCOUNTER — Ambulatory Visit (INDEPENDENT_AMBULATORY_CARE_PROVIDER_SITE_OTHER): Payer: Managed Care, Other (non HMO)

## 2021-09-19 ENCOUNTER — Other Ambulatory Visit: Payer: Self-pay

## 2021-09-19 DIAGNOSIS — I824Y3 Acute embolism and thrombosis of unspecified deep veins of proximal lower extremity, bilateral: Secondary | ICD-10-CM

## 2021-09-19 DIAGNOSIS — Z7901 Long term (current) use of anticoagulants: Secondary | ICD-10-CM

## 2021-09-19 LAB — POCT INR: INR: 1.9 — AB (ref 2.0–3.0)

## 2021-09-19 NOTE — Patient Instructions (Signed)
Take 1.5 tablets tonight and then continue 1 tablet daily. Recheck INR in 3 weeks. Coumadin Clinic [phone]

## 2021-09-25 ENCOUNTER — Other Ambulatory Visit: Payer: Self-pay | Admitting: Cardiovascular Disease

## 2021-10-10 ENCOUNTER — Other Ambulatory Visit: Payer: Self-pay

## 2021-10-10 ENCOUNTER — Ambulatory Visit (INDEPENDENT_AMBULATORY_CARE_PROVIDER_SITE_OTHER): Payer: Managed Care, Other (non HMO)

## 2021-10-10 DIAGNOSIS — Z7901 Long term (current) use of anticoagulants: Secondary | ICD-10-CM

## 2021-10-10 DIAGNOSIS — I824Y3 Acute embolism and thrombosis of unspecified deep veins of proximal lower extremity, bilateral: Secondary | ICD-10-CM | POA: Diagnosis not present

## 2021-10-10 LAB — POCT INR: INR: 2.2 (ref 2.0–3.0)

## 2021-10-10 NOTE — Patient Instructions (Signed)
continue 1 tablet daily. Recheck INR in 6 weeks. Coumadin Clinic [phone] 

## 2021-11-21 ENCOUNTER — Other Ambulatory Visit: Payer: Self-pay

## 2021-11-21 ENCOUNTER — Ambulatory Visit (INDEPENDENT_AMBULATORY_CARE_PROVIDER_SITE_OTHER): Payer: Managed Care, Other (non HMO)

## 2021-11-21 DIAGNOSIS — Z7901 Long term (current) use of anticoagulants: Secondary | ICD-10-CM | POA: Diagnosis not present

## 2021-11-21 DIAGNOSIS — I824Y3 Acute embolism and thrombosis of unspecified deep veins of proximal lower extremity, bilateral: Secondary | ICD-10-CM | POA: Diagnosis not present

## 2021-11-21 LAB — POCT INR: INR: 2.6 (ref 2.0–3.0)

## 2021-11-21 NOTE — Patient Instructions (Signed)
continue 1 tablet daily. Recheck INR in 6 weeks. Coumadin Clinic [phone] 

## 2021-12-06 ENCOUNTER — Other Ambulatory Visit: Payer: Self-pay | Admitting: Cardiovascular Disease

## 2021-12-10 ENCOUNTER — Encounter: Payer: Self-pay | Admitting: Gastroenterology

## 2022-01-02 ENCOUNTER — Ambulatory Visit: Payer: Managed Care, Other (non HMO)

## 2022-01-02 ENCOUNTER — Other Ambulatory Visit: Payer: Self-pay

## 2022-01-02 DIAGNOSIS — I824Y3 Acute embolism and thrombosis of unspecified deep veins of proximal lower extremity, bilateral: Secondary | ICD-10-CM

## 2022-01-02 DIAGNOSIS — Z7901 Long term (current) use of anticoagulants: Secondary | ICD-10-CM | POA: Diagnosis not present

## 2022-01-02 LAB — POCT INR: INR: 2.5 (ref 2.0–3.0)

## 2022-01-02 NOTE — Patient Instructions (Signed)
continue 1 tablet daily. Recheck INR in 6 weeks. Coumadin Clinic [phone] 

## 2022-02-08 ENCOUNTER — Other Ambulatory Visit: Payer: Self-pay | Admitting: Cardiovascular Disease

## 2022-02-13 ENCOUNTER — Other Ambulatory Visit: Payer: Self-pay

## 2022-02-13 ENCOUNTER — Ambulatory Visit: Payer: Managed Care, Other (non HMO)

## 2022-02-13 DIAGNOSIS — I824Y3 Acute embolism and thrombosis of unspecified deep veins of proximal lower extremity, bilateral: Secondary | ICD-10-CM

## 2022-02-13 DIAGNOSIS — Z7901 Long term (current) use of anticoagulants: Secondary | ICD-10-CM

## 2022-02-13 LAB — POCT INR: INR: 2.5 (ref 2.0–3.0)

## 2022-02-13 NOTE — Patient Instructions (Signed)
continue 1 tablet daily. Recheck INR in 6 weeks. Coumadin Clinic [phone] 

## 2022-04-03 ENCOUNTER — Ambulatory Visit (INDEPENDENT_AMBULATORY_CARE_PROVIDER_SITE_OTHER): Payer: Managed Care, Other (non HMO)

## 2022-04-03 DIAGNOSIS — I824Y3 Acute embolism and thrombosis of unspecified deep veins of proximal lower extremity, bilateral: Secondary | ICD-10-CM

## 2022-04-03 DIAGNOSIS — Z7901 Long term (current) use of anticoagulants: Secondary | ICD-10-CM

## 2022-04-03 LAB — POCT INR: INR: 1.7 — AB (ref 2.0–3.0)

## 2022-04-03 NOTE — Patient Instructions (Addendum)
TAKE 2 TABLETS TONIGHT ONLY and then continue 1 tablet daily. Recheck INR in 5 weeks. Coumadin Clinic [phone] ?

## 2022-05-15 ENCOUNTER — Ambulatory Visit (INDEPENDENT_AMBULATORY_CARE_PROVIDER_SITE_OTHER): Payer: Managed Care, Other (non HMO)

## 2022-05-15 DIAGNOSIS — Z7901 Long term (current) use of anticoagulants: Secondary | ICD-10-CM

## 2022-05-15 DIAGNOSIS — I824Y3 Acute embolism and thrombosis of unspecified deep veins of proximal lower extremity, bilateral: Secondary | ICD-10-CM | POA: Diagnosis not present

## 2022-05-15 LAB — POCT INR: INR: 3.3 — AB (ref 2.0–3.0)

## 2022-05-15 NOTE — Patient Instructions (Signed)
continue 1 tablet daily. Recheck INR in 6 weeks. Coumadin Clinic [phone];  eat greens today

## 2022-06-22 ENCOUNTER — Other Ambulatory Visit: Payer: Self-pay

## 2022-06-22 ENCOUNTER — Emergency Department (HOSPITAL_COMMUNITY): Payer: Managed Care, Other (non HMO)

## 2022-06-22 ENCOUNTER — Emergency Department (HOSPITAL_COMMUNITY)
Admission: EM | Admit: 2022-06-22 | Discharge: 2022-06-22 | Disposition: A | Discharge status: Home / Self Care | Payer: Managed Care, Other (non HMO) | Attending: Emergency Medicine | Admitting: Emergency Medicine

## 2022-06-22 DIAGNOSIS — W19XXXA Unspecified fall, initial encounter: Secondary | ICD-10-CM | POA: Diagnosis not present

## 2022-06-22 DIAGNOSIS — R519 Headache, unspecified: Secondary | ICD-10-CM | POA: Diagnosis present

## 2022-06-22 DIAGNOSIS — Z7901 Long term (current) use of anticoagulants: Secondary | ICD-10-CM | POA: Diagnosis not present

## 2022-06-22 LAB — CBC WITH DIFFERENTIAL/PLATELET
Abs Immature Granulocytes: 0.02 10*3/uL (ref 0.00–0.07)
Basophils Absolute: 0.1 10*3/uL (ref 0.0–0.1)
Basophils Relative: 1 %
Eosinophils Absolute: 0.2 10*3/uL (ref 0.0–0.5)
Eosinophils Relative: 2 %
HCT: 35.6 % — ABNORMAL LOW (ref 36.0–46.0)
Hemoglobin: 11.3 g/dL — ABNORMAL LOW (ref 12.0–15.0)
Immature Granulocytes: 0 %
Lymphocytes Relative: 35 %
Lymphs Abs: 2.5 10*3/uL (ref 0.7–4.0)
MCH: 27.4 pg (ref 26.0–34.0)
MCHC: 31.7 g/dL (ref 30.0–36.0)
MCV: 86.4 fL (ref 80.0–100.0)
Monocytes Absolute: 0.5 10*3/uL (ref 0.1–1.0)
Monocytes Relative: 7 %
Neutro Abs: 4 10*3/uL (ref 1.7–7.7)
Neutrophils Relative %: 55 %
Platelets: 274 10*3/uL (ref 150–400)
RBC: 4.12 MIL/uL (ref 3.87–5.11)
RDW: 14.5 % (ref 11.5–15.5)
WBC: 7.2 10*3/uL (ref 4.0–10.5)
nRBC: 0 % (ref 0.0–0.2)

## 2022-06-22 LAB — PROTIME-INR
INR: 1.7 — ABNORMAL HIGH (ref 0.8–1.2)
Prothrombin Time: 19.6 seconds — ABNORMAL HIGH (ref 11.4–15.2)

## 2022-06-22 LAB — BASIC METABOLIC PANEL
Anion gap: 10 (ref 5–15)
BUN: 23 mg/dL (ref 8–23)
CO2: 25 mmol/L (ref 22–32)
Calcium: 9.7 mg/dL (ref 8.9–10.3)
Chloride: 103 mmol/L (ref 98–111)
Creatinine, Ser: 1.34 mg/dL — ABNORMAL HIGH (ref 0.44–1.00)
GFR, Estimated: 41 mL/min — ABNORMAL LOW (ref >60)
Glucose, Bld: 129 mg/dL — ABNORMAL HIGH (ref 70–99)
Potassium: 3.8 mmol/L (ref 3.5–5.1)
Sodium: 138 mmol/L (ref 135–145)

## 2022-06-22 NOTE — Discharge Instructions (Signed)
As discussed, your evaluation today has been largely reassuring.  But, it is important that you monitor your condition carefully, and do not hesitate to return to the ED if you develop new, or concerning changes in your condition. ? ?Otherwise, please follow-up with your physician for appropriate ongoing care. ? ?

## 2022-06-22 NOTE — ED Notes (Signed)
Patient transported to CT by TRN ?

## 2022-06-22 NOTE — ED Triage Notes (Signed)
Pt BIB [facility] from home after mechanical fall d/t turning too fast, landed on right side of head. Patient reports right sided jaw and neck pain. No LOC.

## 2022-06-22 NOTE — ED Notes (Signed)
Patient stated she needed to urinate, she was told that she would have to use a purwick or bedpan until her c-spine was cleared. Patient stated those wouldn't work and she would just hold it until she could walk.

## 2022-06-22 NOTE — ED Provider Notes (Signed)
[facility] EMERGENCY DEPARTMENT Provider Note   CSN: [id number] Arrival date & time: 06/22/22  2108     History  Chief Complaint  Patient presents with   Lytle Michaels    Evelyn [name] is a 76 y.o. female.  HPI Patient presents via EMS as a level 2 trauma.  Patient takes Coumadin with history of DVT.  She notes that sometimes when she turns she is unsteady, and today, after turning she was unsteady fell to the ground striking her face, head.  She complains of pain in the right side of her head, face, less so in the neck.  No chest pain, pelvis pain, abdominal pain, no weakness in any extremity.  EMS reports no hemodynamic instability in route.    Home Medications Prior to Admission medications   Medication Sig Start Date End Date Taking? Authorizing Provider  acetaminophen (TYLENOL) 500 MG tablet Take 500 mg by mouth every 6 (six) hours as needed.    [provider]  Calcium Citrate-Vitamin D (CALCIUM CITRATE + D PO) Take 1 tablet by mouth 2 (two) times daily with a meal.    [provider]  cetirizine (ZYRTEC) 10 MG tablet Take 10 mg by mouth daily.    [provider]  Coenzyme Q10 50 MG CAPS Take 1 capsule by mouth every morning.    [provider]  Cyanocobalamin (B-12) 2000 MCG TABS Take 1 tablet by mouth daily. 06/13/20   [provider]  HYDROcodone-acetaminophen (NORCO/VICODIN) 5-325 MG tablet Take 1 tablet by mouth every 6 (six) hours as needed for moderate pain.    [provider]  lamoTRIgine (LAMICTAL) 100 MG tablet Take 50 mg by mouth 2 (two) times daily. Takes with '200mg'$  tablets for a total dose of '250mg'$  twice a day    [provider]  lamoTRIgine (LAMICTAL) 200 MG tablet Take by mouth 2 (two) times daily. Takes with '50mg'$  tablets for a total dose of '250mg'$  twice a day    [provider]  linaclotide Rolan Lipa) 145 MCG CAPS capsule Take 1 capsule (145 mcg total) by mouth daily before  breakfast. [id number], EXP: 06-2020 11/03/19   Armbruster, Carlota Raspberry, MD  Multiple Vitamin (MULTIVITAMIN WITH MINERALS) TABS tablet Take 1 tablet by mouth 2 (two) times daily with a meal.    [provider]  Omega-3 Fatty Acids (OMEGA 3 PO) Take 1 capsule by mouth every morning.    [provider]  polyethylene glycol (MIRALAX) 17 g packet Take 17 g by mouth 2 (two) times daily. 11/03/19   Yetta Flock, MD  warfarin (COUMADIN) 7.5 MG tablet TAKE 1 TO 2 TABLETS BY MOUTH DAILY AS DIRECTED BY COUMADIN CLINIC 02/09/22   [name], MD      Allergies    Tegretol [carbamazepine]    Review of Systems   Review of Systems  All other systems reviewed and are negative.   Physical Exam Updated Vital Signs BP (!) 157/82   Pulse 96   Temp 97.9 F (36.6 C) (Oral)   Resp 19   SpO2 97%  Physical Exam Vitals and nursing note reviewed.  Constitutional:      General: She is not in acute distress.    Appearance: She is well-developed.  HENT:     Head: Normocephalic and atraumatic.  Eyes:     Conjunctiva/sclera: Conjunctivae normal.  Neck:     Comments: Collar in place, no tenderness on palpation Cardiovascular:     Rate and Rhythm:  Normal rate and regular rhythm.  Pulmonary:     Effort: Pulmonary effort is normal. No respiratory distress.     Breath sounds: Normal breath sounds. No stridor.  Abdominal:     General: There is no distension.  Skin:    General: Skin is warm and dry.  Neurological:     Mental Status: She is alert and oriented to person, place, and time.     Cranial Nerves: No cranial nerve deficit.     Comments: Age-appropriate atrophy otherwise unremarkable neurologic exam  Psychiatric:        Mood and Affect: Mood normal.     ED Results / Procedures / Treatments   Labs (all labs ordered are listed, but only abnormal results are displayed) Labs Reviewed  BASIC METABOLIC PANEL - Abnormal; Notable for the following components:      Result Value    Glucose, Bld 129 (*)    Creatinine, Ser 1.34 (*)    GFR, Estimated 41 (*)    All other components within normal limits  CBC WITH DIFFERENTIAL/PLATELET - Abnormal; Notable for the following components:   Hemoglobin 11.3 (*)    HCT 35.6 (*)    All other components within normal limits  PROTIME-INR - Abnormal; Notable for the following components:   Prothrombin Time 19.6 (*)    INR 1.7 (*)    All other components within normal limits    EKG None  Radiology CT HEAD WO CONTRAST  Result Date: 06/22/2022 CLINICAL DATA:  Head trauma. EXAM: CT HEAD WITHOUT CONTRAST CT MAXILLOFACIAL WITHOUT CONTRAST TECHNIQUE: Multidetector CT imaging of the head, cervical spine, and maxillofacial structures were performed using the standard protocol without intravenous contrast. Multiplanar CT image reconstructions of the cervical spine and maxillofacial structures were also generated. RADIATION DOSE REDUCTION: This exam was performed according to the departmental dose-optimization program which includes automated exposure control, adjustment of the mA and/or kV according to patient size and/or use of iterative reconstruction technique. COMPARISON:  CT head and cervical spine 12/29/2015 FINDINGS: CT HEAD FINDINGS Brain: No evidence of acute infarction, hemorrhage,extra-axial collection or mass lesion/mass effect. Ventricular system is enlarged and unchanged from 2017. Vascular: Atherosclerotic calcifications are present within the cavernous internal carotid arteries. Skull: Normal. Negative for fracture or focal lesion. Other: None. CT MAXILLOFACIAL FINDINGS Osseous: No fracture or mandibular dislocation. No destructive process. Orbits: Negative. No traumatic or inflammatory finding. Sinuses: Sign air-fluid level in the right maxillary sinus. Soft tissues: Negative. IMPRESSION: 1. No acute intracranial hemorrhage. 2. 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 3. No acute  facial fracture. Electronically Signed   By: Ronney Asters M.D.   On: 06/22/2022 22:08   CT MAXILLOFACIAL WO CONTRAST  Result Date: 06/22/2022 CLINICAL DATA:  Head trauma. EXAM: CT HEAD WITHOUT CONTRAST CT MAXILLOFACIAL WITHOUT CONTRAST TECHNIQUE: Multidetector CT imaging of the head, cervical spine, and maxillofacial structures were performed using the standard protocol without intravenous contrast. Multiplanar CT image reconstructions of the cervical spine and maxillofacial structures were also generated. RADIATION DOSE REDUCTION: This exam was performed according to the departmental dose-optimization program which includes automated exposure control, adjustment of the mA and/or kV according to patient size and/or use of iterative reconstruction technique. COMPARISON:  CT head and cervical spine 12/29/2015 FINDINGS: CT HEAD FINDINGS Brain: No evidence of acute infarction, hemorrhage,extra-axial collection or mass lesion/mass effect. Ventricular system is enlarged and unchanged from 2017. Vascular: Atherosclerotic calcifications are present within the cavernous internal carotid arteries. Skull: Normal. Negative for fracture or  focal lesion. Other: None. CT MAXILLOFACIAL FINDINGS Osseous: No fracture or mandibular dislocation. No destructive process. Orbits: Negative. No traumatic or inflammatory finding. Sinuses: Sign air-fluid level in the right maxillary sinus. Soft tissues: Negative. IMPRESSION: 1. No acute intracranial hemorrhage. 2. 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 3. No acute facial fracture. Electronically Signed   By: Ronney Asters M.D.   On: 06/22/2022 22:08   CT CERVICAL SPINE WO CONTRAST  Result Date: 06/22/2022 CLINICAL DATA:  Trauma.  Fall. EXAM: CT CERVICAL SPINE WITHOUT CONTRAST TECHNIQUE: Multidetector CT imaging of the cervical spine was performed without intravenous contrast. Multiplanar CT image reconstructions were also generated.  RADIATION DOSE REDUCTION: This exam was performed according to the departmental dose-optimization program which includes automated exposure control, adjustment of the mA and/or kV according to patient size and/or use of iterative reconstruction technique. COMPARISON:  Cervical spine CT 12/29/2015. CT of the chest 03/08/2009. FINDINGS: Alignment: Normal. Skull base and vertebrae: No acute fracture. No primary bone lesion or focal pathologic process. Soft tissues and spinal canal: No prevertebral fluid or swelling. No visible canal hematoma. Disc levels: Disc space narrowing at C3-C4 and C5-C6 appears unchanged. No significant central canal or neural foraminal stenosis identified. Upper chest: There are 2 nodular densities in the right lung apex measuring up to 4 mm. Other: There are atherosclerotic calcifications of the bilateral carotid artery bifurcations. IMPRESSION: 1. No acute fracture or traumatic subluxation of the cervical spine. 2. Stable mild degenerative changes. 3. Multiple pulmonary nodules. Most severe: 4 mm right solid pulmonary nodule within the upper lobe. If patient is low risk for malignancy, no routine follow-up imaging is recommended; if patient is high risk for malignancy, a non-contrast Chest CT at 12 months is optional. If performed and the nodule is stable at 12 months, no further follow-up is recommended. These guidelines do not apply to immunocompromised patients and patients with cancer. Follow up in patients with significant comorbidities as clinically warranted. For lung cancer screening, adhere to Lung-RADS guidelines. Reference: Radiology. 2017; 284(1):228-43. Electronically Signed   By: Ronney Asters M.D.   On: 06/22/2022 21:45    Procedures Procedures    Medications Ordered in ED Medications - No data to display  ED Course/ Medical Decision Making/ A&P This patient with a Hx of DVT, unsteadiness presents to the ED for concern of head trauma following a fall, this involves  an extensive number of treatment options, and is a complaint that carries with it a high risk of complications and morbidity.    The differential diagnosis includes intracranial abnormality, fracture head, neck, face   Social Determinants of Health:  Advanced age  Additional history obtained:  Additional history and/or information obtained from EMS, notable for details of event as above, transport as well   After the initial evaluation, orders, including: T, labs were initiated.   Patient placed on Cardiac and Pulse-Oximetry Monitors. The patient was maintained on a cardiac monitor.  The cardiac monitored showed an rhythm of 90 sinus normal The patient was also maintained on pulse oximetry. The readings were typically 100% room air normal   On repeat evaluation of the patient stayed the same 10:50 PM Cervical collar removed, C-spine cleared Lab Tests:  I personally interpreted labs.  The pertinent results include: Unremarkable labs aside from mild increase in creatinine, in comparison to labs from 1 year ago  Imaging Studies ordered:  I independently visualized and interpreted imaging which showed no acute cervical cranial or facial fracture, no  intracranial hemorrhage I agree with the radiologist interpretation   Dispostion / Final MDM:  After consideration of the diagnostic results and the patient's response to treatment, this adult female presents after mechanical fall.  Patient is awake and alert, neurologically intact, hemodynamically unremarkable.  Patient's findings are reassuring, she is no evidence for intracranial hemorrhage, fracture, labs notable only for mild elevation in creatinine, otherwise reassuring.  Patient discharged in stable condition.  Final Clinical Impression(s) / ED Diagnoses Final diagnoses:  Fall, initial encounter     Carmin Muskrat, MD 06/22/22 2250

## 2022-06-22 NOTE — Progress Notes (Signed)
Orthopedic Tech Progress Note Patient Details:  Evelyn [name] Nov 04, 1946 [id number]  Patient ID: [name], female   DOB: Dec 31, 1945, 76 y.o.   MRN: [id number] I attended trauma page. Karolee Stamps 06/22/2022, 9:24 PM

## 2022-06-22 NOTE — Progress Notes (Signed)
Chaplain responded to level II fall on thinners.  Patient arrived from home and spouse will be coming.  Patient unavailable at this time due to evaluation.  Chaplain available for support for patient/spouse. [place], [place].    06/22/22 2134  Clinical Encounter Type  Visited With Health care provider  Visit Type Initial;Trauma;ED  Referral From Nurse  Consult/Referral To Chaplain

## 2022-06-22 NOTE — ED Notes (Signed)
Trauma Response Nurse Documentation   Evelyn [name] is a 76 y.o. female arriving to [facility] ED via [facility]  On warfarin daily. Trauma was activated as a Level 2 by Rolanda Jay based on the following trauma criteria Elderly patients > 65 with head trauma on anti-coagulation (excluding ASA). Trauma team at the bedside on patient arrival. Patient cleared for CT by Dr. Vanita Panda. Patient to CT with team. GCS 15.  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, but unable to be removed, being treated by Dr Joan Flores at [facility])    History of echocardiogram 03/11/09   nl EF 60-65%   History of recurrent UTIs    feels like may have one now   PE (pulmonary embolism) 2005   Peripheral vascular disease ([place])    dvt leg rt   Seizures ([place])      Past Surgical History:  Procedure Laterality Date   arm surgery Right    INSERTION OF VENA CAVA FILTER  10   KNEE SURGERY Left 96   kneecap fx   ORIF HUMERUS FRACTURE Right 08/01/2013   Procedure: NON-UNION REPAIR RIGHT PROXIMAL HUMERUS FRACTURE;  Surgeon: Rozanna Box, MD;  Location: [place];  Service: Orthopedics;  Laterality: Right;       Initial Focused Assessment (If applicable, or please see trauma documentation): See event summary.  CT's Completed:   CT Head, CT Maxillofacial, and CT C-Spine   Interventions:  See event summary.  Plan for disposition:  None at this time.  Consults completed:  none at 2138.  Event Summary: Patient was brought in by [facility] from home as a Level 2 fall on thinners. Patient was at home, experienced a mechanical fall. Patient tripped over a rug. Patient complaining of right-sided facial pain. Denies loss of consciousness. No visible trauma. Patient with c-collar on via EMS.  20G PIV RAC Trauma labs CT head CT c-spin CT  maxillofacial MTP Summary (If applicable):   Bedside handoff with ED RN Paden.    Trudee Kuster  Trauma Response RN  Please call TRN at [phone] for further assistance.

## 2022-06-29 ENCOUNTER — Other Ambulatory Visit: Payer: Self-pay | Admitting: Cardiovascular Disease

## 2022-06-29 DIAGNOSIS — I4891 Unspecified atrial fibrillation: Secondary | ICD-10-CM

## 2022-06-29 NOTE — Telephone Encounter (Signed)
Prescription refill request received for warfarin Lov: 07/05/20 Gwenlyn Found) Next INR check: 07/03/22 Warfarin tablet strength: 7.'5mg'$   Pt overdue for cardiology visit. Note placed on coumadin clinic appt to have pt schedule appt with cardiologist. Appropriate dose and refill sent to requested pharmacy.

## 2022-07-14 ENCOUNTER — Ambulatory Visit (INDEPENDENT_AMBULATORY_CARE_PROVIDER_SITE_OTHER): Payer: Managed Care, Other (non HMO)

## 2022-07-14 DIAGNOSIS — Z7901 Long term (current) use of anticoagulants: Secondary | ICD-10-CM

## 2022-07-14 DIAGNOSIS — I824Y3 Acute embolism and thrombosis of unspecified deep veins of proximal lower extremity, bilateral: Secondary | ICD-10-CM

## 2022-07-14 LAB — POCT INR: INR: 3 (ref 2.0–3.0)

## 2022-07-14 NOTE — Patient Instructions (Signed)
continue 1 tablet daily. Recheck INR in 6 weeks. Coumadin Clinic [phone]

## 2022-08-15 ENCOUNTER — Other Ambulatory Visit: Payer: Self-pay | Admitting: Cardiovascular Disease

## 2022-08-15 DIAGNOSIS — I4891 Unspecified atrial fibrillation: Secondary | ICD-10-CM

## 2022-08-28 ENCOUNTER — Encounter: Payer: Self-pay | Admitting: Nurse Practitioner

## 2022-08-28 ENCOUNTER — Ambulatory Visit: Payer: Managed Care, Other (non HMO) | Admitting: Nurse Practitioner

## 2022-08-28 ENCOUNTER — Ambulatory Visit: Payer: Managed Care, Other (non HMO) | Attending: Internal Medicine | Admitting: *Deleted

## 2022-08-28 VITALS — BP 138/76 | HR 88 | Ht 64.0 in | Wt 155.2 lb

## 2022-08-28 DIAGNOSIS — I824Y3 Acute embolism and thrombosis of unspecified deep veins of proximal lower extremity, bilateral: Secondary | ICD-10-CM

## 2022-08-28 DIAGNOSIS — R296 Repeated falls: Secondary | ICD-10-CM | POA: Diagnosis not present

## 2022-08-28 DIAGNOSIS — Z86718 Personal history of other venous thrombosis and embolism: Secondary | ICD-10-CM

## 2022-08-28 DIAGNOSIS — Z7901 Long term (current) use of anticoagulants: Secondary | ICD-10-CM

## 2022-08-28 DIAGNOSIS — Z86711 Personal history of pulmonary embolism: Secondary | ICD-10-CM | POA: Diagnosis not present

## 2022-08-28 LAB — POCT INR: INR: 1.9 — AB (ref 2.0–3.0)

## 2022-08-28 NOTE — Progress Notes (Signed)
Office Visit    Patient Name: Evelyn [name] Date of Encounter: 08/28/2022  Primary Care Provider:  Prince Solian, MD Primary Cardiologist:  Quay Burow, MD  Chief Complaint    76 year old female with a history of PE/bilateral DVT s/p IVC filter on Coumadin, recurrent falls, recurrent UTIs, and seizures who presents for follow-up related to PE/DVT.   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, but unable to be removed, being treated by Dr Joan Flores at [facility])    History of echocardiogram 03/11/09   nl EF 60-65%   History of recurrent UTIs    feels like may have one now   PE (pulmonary embolism) 2005   Peripheral vascular disease ([place])    dvt leg rt   Seizures ([place])    Past Surgical History:  Procedure Laterality Date   arm surgery Right    INSERTION OF VENA CAVA FILTER  10   KNEE SURGERY Left 96   kneecap fx   ORIF HUMERUS FRACTURE Right 08/01/2013   Procedure: NON-UNION REPAIR RIGHT PROXIMAL HUMERUS FRACTURE;  Surgeon: Rozanna Box, MD;  Location: [place];  Service: Orthopedics;  Laterality: Right;    Allergies  Allergies  Allergen Reactions   Tegretol [Carbamazepine] Rash    History of Present Illness    76 year old female with the above past medical history including PE/bilateral DVT s/p IVC filter on Coumadin, recurrent falls, recurrent UTIs, and seizures.  Previously followed with Dr. Ellyn Hack.  She has since followed with Dr. Gwenlyn Found.  She was last seen in the office on 07/05/2020 and was stable from a cardiac standpoint.  She has not been seen in follow-up since.  She has a history of recurrent falls.  She was evaluated in the ED in July 2023 in the setting of mechanical fall.  CT of the head/C-spine/ maxillofacial showed no acute intracranial hemorrhage, no acute facial fracture.  She was discharged home  in stable condition.  She presents today for follow-up accompanied by her husband.  Since her last visit she has been stable from a cardiac standpoint.  She denies any chest pain, dyspnea.  Denies any recent falls.  She has been evaluated by ENT, ophthalmology, and neurology for her ongoing balance issues and history of falls.  She is pending a second opinion with a new neurologist.  Otherwise, she reports feeling well and denies any additional concerns today.  Home Medications    Current Outpatient Medications  Medication Sig Dispense Refill   acetaminophen (TYLENOL) 500 MG tablet Take 500 mg by mouth every 6 (six) hours as needed.     Calcium Citrate-Vitamin D (CALCIUM CITRATE + D PO) Take 1 tablet by mouth 2 (two) times daily with a meal.     cetirizine (ZYRTEC) 10 MG tablet Take 10 mg by mouth daily.     Coenzyme Q10 50 MG CAPS Take 1 capsule by mouth every morning.     Cyanocobalamin (B-12) 2000 MCG TABS Take 1 tablet by mouth daily.     HYDROcodone-acetaminophen (NORCO/VICODIN) 5-325 MG tablet Take 1 tablet by mouth every 6 (six) hours as needed for moderate pain.     lamoTRIgine (LAMICTAL) 100 MG tablet Take 50 mg by mouth 2 (two) times daily. Takes with '200mg'$  tablets for a total dose of '250mg'$  twice a day  lamoTRIgine (LAMICTAL) 200 MG tablet Take by mouth 2 (two) times daily. Takes with '50mg'$  tablets for a total dose of '250mg'$  twice a day     mirtazapine (REMERON) 7.5 MG tablet Take 3.75-7.5 mg by mouth at bedtime.     Multiple Vitamin (MULTIVITAMIN WITH MINERALS) TABS tablet Take 1 tablet by mouth 2 (two) times daily with a meal.     Omega-3 Fatty Acids (OMEGA 3 PO) Take 1 capsule by mouth every morning.     polyethylene glycol (MIRALAX) 17 g packet Take 17 g by mouth 2 (two) times daily. 14 each 0   warfarin (COUMADIN) 7.5 MG tablet TAKE 1 TO 2 TABLETS BY MOUTH DAILY AS DIRECTED BY COUMADIN CLINIC 45 tablet 0   linaclotide (LINZESS) 145 MCG CAPS capsule Take 1 capsule (145 mcg total)  by mouth daily before breakfast. [id number], EXP: 06-2020 12 capsule 0   No current facility-administered medications for this visit.     Review of Systems    She denies chest pain, palpitations, dyspnea, pnd, orthopnea, n, v, dizziness, syncope, edema, weight gain, or early satiety. All other systems reviewed and are otherwise negative except as noted above.   Physical Exam    VS:  BP 138/76   Pulse 88   Ht '5\' 4"'$  (1.626 m)   Wt 155 lb 3.2 oz (70.4 kg)   SpO2 97%   BMI 26.64 kg/m  GEN: Well nourished, well developed, in no acute distress. HEENT: normal. Neck: Supple, no JVD, carotid bruits, or masses. Cardiac: RRR, no murmurs, rubs, or gallops. No clubbing, cyanosis, edema.  Radials/DP/PT 2+ and equal bilaterally.  Respiratory:  Respirations regular and unlabored, clear to auscultation bilaterally. GI: Soft, nontender, nondistended, BS + x 4. MS: no deformity or atrophy. Skin: warm and dry, no rash. Neuro:  Strength and sensation are intact. Psych: Normal affect.  Accessory Clinical Findings    ECG personally reviewed by me today - NSR, 88 bpm - no acute changes.   Lab Results  Component Value Date   WBC 7.2 06/22/2022   HGB 11.3 (L) 06/22/2022   HCT 35.6 (L) 06/22/2022   MCV 86.4 06/22/2022   PLT 274 06/22/2022   Lab Results  Component Value Date   CREATININE 1.34 (H) 06/22/2022   BUN 23 06/22/2022   NA 138 06/22/2022   K 3.8 06/22/2022   CL 103 06/22/2022   CO2 25 06/22/2022   Lab Results  Component Value Date   ALT 12 08/01/2021   AST 30 08/01/2021   ALKPHOS 100 08/01/2021   BILITOT 0.4 08/01/2021   Lab Results  Component Value Date   CHOL (H) 03/09/2009    245        ATP III CLASSIFICATION:  <200     mg/dL   Desirable  200-239  mg/dL   Borderline High  >=240    mg/dL   High          HDL 99 03/09/2009   LDLCALC (H) 03/09/2009    137        Total Cholesterol/HDL:CHD Risk Coronary Heart Disease Risk Table                     Men   Women  1/2 Average  Risk   3.4   3.3  Average Risk       5.0   4.4  2 X Average Risk   9.6   7.1  3 X Average Risk  23.4   11.0  Use the calculated Patient Ratio above and the CHD Risk Table to determine the patient's CHD Risk.        ATP III CLASSIFICATION (LDL):  <100     mg/dL   Optimal  100-129  mg/dL   Near or Above                    Optimal  130-159  mg/dL   Borderline  160-189  mg/dL   High  >190     mg/dL   Very High   TRIG 47 03/09/2009   CHOLHDL 2.5 03/09/2009    No results found for: "HGBA1C"  Assessment & Plan    1. H/o PE/DVT: S/p IVC filter.  Denies bleeding. Continue coumadin.   2. Recurrent Falls: She continues to have issues with balance and has had multiple falls.  She has been evaluated by several specialists including ENT, ophthalmology, and neurology.  She is currently seeking a second opinion from neurology.  Discussed ED precautions given chronic Coumadin. Pt verbalized understanding.  3. Disposition: Follow-up in 1 year.       Lenna Sciara, NP 08/28/2022, 5:12 PM

## 2022-08-28 NOTE — Patient Instructions (Signed)
Description   Today take 1.5 tablets then continue taking 1 tablet daily. Recheck INR in 4 weeks. Coumadin Clinic [phone]

## 2022-08-28 NOTE — Patient Instructions (Signed)
Medication Instructions:   Your physician recommends that you continue on your current medications as directed. Please refer to the Current Medication list given to you today.   *If you need a refill on your cardiac medications before your next appointment, please call your pharmacy*   Lab Work: NONE ordered at this time of appointment   If you have labs (blood work) drawn today and your tests are completely normal, you will receive your results only by: MyChart Message (if you have MyChart) OR A paper copy in the mail If you have any lab test that is abnormal or we need to change your treatment, we will call you to review the results.   Testing/Procedures: NONE ordered at this time of appointment     Follow-Up: At [facility], you and your health needs are our priority.  As part of our continuing mission to provide you with exceptional heart care, we have created designated Provider Care Teams.  These Care Teams include your primary Cardiologist (physician) and Advanced Practice Providers (APPs -  Physician Assistants and Nurse Practitioners) who all work together to provide you with the care you need, when you need it.  We recommend signing up for the patient portal called "MyChart".  Sign up information is provided on this After Visit Summary.  MyChart is used to connect with patients for Virtual Visits (Telemedicine).  Patients are able to view lab/test results, encounter notes, upcoming appointments, etc.  Non-urgent messages can be sent to your provider as well.   To learn more about what you can do with MyChart, go to [email or web address].    Your next appointment:   1 year(s)  The format for your next appointment:   In Person  Provider:   Jonathan Berry, MD     Other Instructions   Important Information About Sugar      

## 2022-09-07 ENCOUNTER — Other Ambulatory Visit: Payer: Self-pay | Admitting: Cardiovascular Disease

## 2022-09-07 DIAGNOSIS — I4891 Unspecified atrial fibrillation: Secondary | ICD-10-CM

## 2022-09-25 ENCOUNTER — Encounter: Payer: Self-pay | Admitting: Neurology

## 2022-09-25 ENCOUNTER — Ambulatory Visit: Payer: Managed Care, Other (non HMO) | Admitting: Neurology

## 2022-09-25 ENCOUNTER — Ambulatory Visit: Payer: Managed Care, Other (non HMO) | Attending: Cardiovascular Disease

## 2022-09-25 VITALS — BP 113/55 | HR 94 | Ht 65.0 in | Wt 155.0 lb

## 2022-09-25 DIAGNOSIS — Z7901 Long term (current) use of anticoagulants: Secondary | ICD-10-CM | POA: Diagnosis not present

## 2022-09-25 DIAGNOSIS — R42 Dizziness and giddiness: Secondary | ICD-10-CM

## 2022-09-25 DIAGNOSIS — I824Y3 Acute embolism and thrombosis of unspecified deep veins of proximal lower extremity, bilateral: Secondary | ICD-10-CM

## 2022-09-25 DIAGNOSIS — Z5181 Encounter for therapeutic drug level monitoring: Secondary | ICD-10-CM

## 2022-09-25 DIAGNOSIS — R2689 Other abnormalities of gait and mobility: Secondary | ICD-10-CM

## 2022-09-25 DIAGNOSIS — R269 Unspecified abnormalities of gait and mobility: Secondary | ICD-10-CM

## 2022-09-25 DIAGNOSIS — G9389 Other specified disorders of brain: Secondary | ICD-10-CM

## 2022-09-25 LAB — POCT INR: INR: 4.2 — AB (ref 2.0–3.0)

## 2022-09-25 NOTE — Progress Notes (Unsigned)
[facility]  PATIENT: Evelyn [name] DOB: 1946-03-07  REQUESTING CLINICIAN: Prince Solian, MD HISTORY FROM: Patient and husband  REASON FOR VISIT: Dizziness and unsteadiness    HISTORICAL  CHIEF COMPLAINT:  Chief Complaint  Patient presents with   New Patient (Initial Visit)    RM 66 with husband here for consult on falls and to r/o seizures. Pt reports she is here to discuss worsening dizziness/unsteadiness that has progressed. Increase in falls last on being July 10 th ( she did hit her head, but no other injuries).      HISTORY OF PRESENT ILLNESS:  This is a 76 year old woman past medical history of seizure disorder, well controlled on lamotrigine, headaches, insomnia, who is presenting with balance problem for the past 2 years.  Patient reports for the past 2 years she had balance problem described as unsteadiness, difficulty with walking.  She denies any room spinning sensation.  She report during this time she had 2 falls, the first 1 was on October 11, 2021.  She was walking and suddenly fell forward like someone pushed her from her back.  The last fall was on July 10.  She reports she was walking and felt like she could not pick up her foot and she fell to the ground.  Denies any major hand injuries with these falls.  She has follow-up with a neurologist in [place], had extensive work-up including multiple MRIs and CT scans (showing ventriculomegaly ?NPH), VNG and they were all within normal limits.    OTHER MEDICAL CONDITIONS: Seizure disorder, Headaches   REVIEW OF SYSTEMS: Full 14 system review of systems performed and negative with exception of: As noted in the HPI   ALLERGIES: Allergies  Allergen Reactions   Tegretol [Carbamazepine] Rash    HOME MEDICATIONS: Outpatient Medications Prior to Visit  Medication Sig Dispense Refill   acetaminophen (TYLENOL) 500 MG tablet Take 500 mg by mouth every 6 (six) hours as needed.     Calcium  Citrate-Vitamin D (CALCIUM CITRATE + D PO) Take 1 tablet by mouth 2 (two) times daily with a meal.     cetirizine (ZYRTEC) 10 MG tablet Take 10 mg by mouth daily.     Coenzyme Q10 50 MG CAPS Take 1 capsule by mouth every morning.     Cyanocobalamin (B-12) 2000 MCG TABS Take 1 tablet by mouth daily.     HYDROcodone-acetaminophen (NORCO/VICODIN) 5-325 MG tablet Take 1 tablet by mouth every 6 (six) hours as needed for moderate pain.     lamoTRIgine (LAMICTAL) 100 MG tablet Take 50 mg by mouth 2 (two) times daily. Takes with '200mg'$  tablets for a total dose of '250mg'$  twice a day     lamoTRIgine (LAMICTAL) 200 MG tablet Take by mouth 2 (two) times daily. Takes with '50mg'$  tablets for a total dose of '250mg'$  twice a day     mirtazapine (REMERON) 7.5 MG tablet Take 3.75-7.5 mg by mouth at bedtime.     Multiple Vitamin (MULTIVITAMIN WITH MINERALS) TABS tablet Take 1 tablet by mouth 2 (two) times daily with a meal.     Omega-3 Fatty Acids (OMEGA 3 PO) Take 1 capsule by mouth every morning.     polyethylene glycol (MIRALAX) 17 g packet Take 17 g by mouth 2 (two) times daily. 14 each 0   warfarin (COUMADIN) 7.5 MG tablet TAKE 1 TO 2 TABLETS BY MOUTH DAILY AS DIRECTED BY COUMADIN CLINIC 45 tablet 0   linaclotide (LINZESS) 145 MCG CAPS capsule Take  1 capsule (145 mcg total) by mouth daily before breakfast. [id number], EXP: 06-2020 12 capsule 0   No facility-administered medications prior to visit.    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, but unable to be removed, being treated by Dr Joan Flores at [facility])    History of echocardiogram 03/11/09   nl EF 60-65%   History of recurrent UTIs    feels like may have one now   PE (pulmonary embolism) 2005   Peripheral vascular disease ([place])    dvt leg rt   Seizures ([place])     PAST SURGICAL HISTORY: Past Surgical  History:  Procedure Laterality Date   arm surgery Right    INSERTION OF VENA CAVA FILTER  10   KNEE SURGERY Left 96   kneecap fx   ORIF HUMERUS FRACTURE Right 08/01/2013   Procedure: NON-UNION REPAIR RIGHT PROXIMAL HUMERUS FRACTURE;  Surgeon: Rozanna Box, MD;  Location: [facility];  Service: Orthopedics;  Laterality: Right;    FAMILY HISTORY: Family History  Problem Relation Age of Onset   Diabetes Mother    Stomach cancer Mother    Stroke Father    Esophageal cancer Neg Hx    Colon cancer Neg Hx    Liver disease Neg Hx    Pancreatic cancer Neg Hx     SOCIAL HISTORY: Social History   Socioeconomic History   Marital status: Married    Spouse name: Not on file   Number of children: Not on file   Years of education: Not on file   Highest education level: Not on file  Occupational History   Not on file  Tobacco Use   Smoking status: Never   Smokeless tobacco: Never  Substance and Sexual Activity   Alcohol use: No   Drug use: No   Sexual activity: Not on file  Other Topics Concern   Not on file  Social History Narrative   Not on file   Social Determinants of Health   Financial Resource Strain: Not on file  Food Insecurity: Not on file  Transportation Needs: Not on file  Physical Activity: Not on file  Stress: Not on file  Social Connections: Not on file  Intimate Partner Violence: Not on file    PHYSICAL EXAM  GENERAL EXAM/CONSTITUTIONAL: Vitals:  Vitals:   09/25/22 1058  BP: (!) 113/55  Pulse: 94  Weight: 155 lb (70.3 kg)  Height: '5\' 5"'$  (1.651 m)   Body mass index is 25.79 kg/m. Wt Readings from Last 3 Encounters:  09/25/22 155 lb (70.3 kg)  08/28/22 155 lb 3.2 oz (70.4 kg)  08/01/21 155 lb (70.3 kg)   Patient is in no distress; well developed, nourished and groomed; neck is supple  EYES: Pupils round and reactive to light, Visual fields full to confrontation, Extraocular movements intacts,   MUSCULOSKELETAL: Gait, strength, tone, movements  noted in Neurologic exam below  NEUROLOGIC: MENTAL STATUS:      No data to display         awake, alert, oriented to person, place and time recent and remote memory intact normal attention and concentration language fluent, comprehension intact, naming intact fund of knowledge appropriate  CRANIAL NERVE:  2nd, 3rd, 4th, 6th - pupils equal and reactive to light, visual fields full to confrontation, extraocular muscles intact, no nystagmus 5th - facial sensation  symmetric 7th - facial strength symmetric 8th - hearing intact 9th - palate elevates symmetrically, uvula midline 11th - shoulder shrug symmetric 12th - tongue protrusion midline  MOTOR:  normal bulk and tone, full strength in the BUE, BLE  SENSORY:  normal and symmetric to light touch  COORDINATION:  finger-nose-finger, fine finger movements normal  REFLEXES:  deep tendon reflexes present and symmetric  GAIT/STATION:  Normal, no evidence of magnetic gait    DIAGNOSTIC DATA (LABS, IMAGING, TESTING) - I reviewed patient records, labs, notes, testing and imaging myself where available.  Lab Results  Component Value Date   WBC 7.2 06/22/2022   HGB 11.3 (L) 06/22/2022   HCT 35.6 (L) 06/22/2022   MCV 86.4 06/22/2022   PLT 274 06/22/2022      Component Value Date/Time   NA 143 09/25/2022 1139   K 4.7 09/25/2022 1139   CL 102 09/25/2022 1139   CO2 25 09/25/2022 1139   GLUCOSE 119 (H) 09/25/2022 1139   GLUCOSE 129 (H) 06/22/2022 2119   BUN 25 09/25/2022 1139   CREATININE 0.83 09/25/2022 1139   CALCIUM 9.9 09/25/2022 1139   PROT 7.7 08/01/2021 1537   ALBUMIN 3.7 08/01/2021 1537   AST 30 08/01/2021 1537   ALT 12 08/01/2021 1537   ALKPHOS 100 08/01/2021 1537   BILITOT 0.4 08/01/2021 1537   GFRNONAA 41 (L) 06/22/2022 2119   GFRAA >60 12/29/2015 1026   Lab Results  Component Value Date   CHOL (H) 03/09/2009    245        ATP III CLASSIFICATION:  <200     mg/dL   Desirable  200-239  mg/dL    Borderline High  >=240    mg/dL   High          HDL 99 03/09/2009   LDLCALC (H) 03/09/2009    137        Total Cholesterol/HDL:CHD Risk Coronary Heart Disease Risk Table                     Men   Women  1/2 Average Risk   3.4   3.3  Average Risk       5.0   4.4  2 X Average Risk   9.6   7.1  3 X Average Risk  23.4   11.0        Use the calculated Patient Ratio above and the CHD Risk Table to determine the patient's CHD Risk.        ATP III CLASSIFICATION (LDL):  <100     mg/dL   Optimal  100-129  mg/dL   Near or Above                    Optimal  130-159  mg/dL   Borderline  160-189  mg/dL   High  >190     mg/dL   Very High   TRIG 47 03/09/2009   CHOLHDL 2.5 03/09/2009   No results found for: "HGBA1C" No results found for: "VITAMINB12" Lab Results  Component Value Date   TSH 2.567 08/01/2021   B12 level 1353 Folate 12.5  Head CT 06/22/22 1. No acute intracranial hemorrhage. 2. 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 3. No acute facial fracture   MRI Brain 08/26/21, MRV Head 08/26/21 There is no reduced parenchymal diffusivity. The basal cisterns are patent. No intracranial mass effect. Redemonstration of mild supratentorial hydrocephalus. The size and configuration of the ventricles  is not remarkably changed from December 2021.  1.  Redemonstrated mild supratentorial hydrocephalus. The size and configuration of the ventricles is unchanged from December 2021.  2. Mild burden of chronic small vessel ischemic disease.  3. No evidence of venous thrombosis.  VNG 11/18/21 Normal    ASSESSMENT AND PLAN  76 y.o. year old female with longstanding history of epilepsy who is well controlled on lamotrigine 250 mg twice daily, headaches, insomnia who is presenting for balance problem for the past 2 years.  Patient had multiple MRIs, CT scan, showing possible normal pressure hydrocephalus but no acute abnormality.  She had a VNG  which was negative.  She had 2 falls due to the balance problem.  On exam today she was noted to be hypotensive 113/55.  At this point I informed the patient that the etiology of the lightheadedness and balance problem is unclear.  It can be a combination of multiple things but I will check the level of the lamotrigine because lamotrigine toxicity can cause dizziness, lamotrigine that can also cause hyponatremia which in turn can cause dizziness.  On top of that encouraged her to increase her fluid intake, add Pedialyte and Gatorade to her drinks, and we will send her to vestibular therapy to help with her balance.  Continue to follow-up with your doctors and return as needed.   1. Balance problem   2. Gait abnormality   3. Dysequilibrium   4. Therapeutic drug monitoring   5. Cerebral ventriculomegaly      Patient Instructions  We will check labs to make sure dizziness is not related to high Lamotrigine level or hyponatremia  Referral to vestibular PT for balance  Increase fluid intake  Continue to follow up with PCP Return as needed or if symptoms fail to improve   Orders Placed This Encounter  Procedures   Lamotrigine level   Basic Metabolic Panel   PT vestibular rehab    No orders of the defined types were placed in this encounter.   Return if symptoms worsen or fail to improve.  I have spent a total of 45 minutes dedicated to this patient today, preparing to see patient, performing a medically appropriate examination and evaluation, ordering tests and/or medications and procedures, and counseling and educating the patient/family/caregiver; independently interpreting result and communicating results to the family/patient/caregiver; and documenting clinical information in the electronic medical record.   Alric Ran, MD 09/28/2022, 10:15 AM  [facility] [address], [place], [place] [address] [phone]

## 2022-09-25 NOTE — Patient Instructions (Signed)
HOLD TODAY ONLY and then continue taking 1 tablet daily. Recheck INR in 2 weeks. [facility]in Clinic [phone]

## 2022-09-28 ENCOUNTER — Encounter: Payer: Self-pay | Admitting: Neurology

## 2022-09-28 NOTE — Patient Instructions (Addendum)
We will check labs to make sure dizziness is not related to high Lamotrigine level or hyponatremia  Referral to vestibular PT for balance  Increase fluid intake  Continue to follow up with PCP Return as needed or if symptoms fail to improve

## 2022-09-29 ENCOUNTER — Other Ambulatory Visit: Payer: Self-pay | Admitting: Cardiovascular Disease

## 2022-09-29 DIAGNOSIS — I4891 Unspecified atrial fibrillation: Secondary | ICD-10-CM

## 2022-09-29 LAB — BASIC METABOLIC PANEL
BUN/Creatinine Ratio: 30 — ABNORMAL HIGH (ref 12–28)
BUN: 25 mg/dL (ref 8–27)
CO2: 25 mmol/L (ref 20–29)
Calcium: 9.9 mg/dL (ref 8.7–10.3)
Chloride: 102 mmol/L (ref 96–106)
Creatinine, Ser: 0.83 mg/dL (ref 0.57–1.00)
Glucose: 119 mg/dL — ABNORMAL HIGH (ref 70–99)
Potassium: 4.7 mmol/L (ref 3.5–5.2)
Sodium: 143 mmol/L (ref 134–144)
eGFR: 73 mL/min/{1.73_m2} (ref >59)

## 2022-09-29 LAB — LAMOTRIGINE LEVEL: Lamotrigine Lvl: 11 ug/mL (ref 2.0–20.0)

## 2022-10-09 ENCOUNTER — Ambulatory Visit: Payer: Managed Care, Other (non HMO) | Attending: Cardiovascular Disease | Admitting: *Deleted

## 2022-10-09 DIAGNOSIS — Z5181 Encounter for therapeutic drug level monitoring: Secondary | ICD-10-CM

## 2022-10-09 DIAGNOSIS — I824Y3 Acute embolism and thrombosis of unspecified deep veins of proximal lower extremity, bilateral: Secondary | ICD-10-CM

## 2022-10-09 DIAGNOSIS — Z7901 Long term (current) use of anticoagulants: Secondary | ICD-10-CM | POA: Diagnosis not present

## 2022-10-09 LAB — POCT INR: POC INR: 1.8

## 2022-10-09 NOTE — Patient Instructions (Signed)
Description   Take warfarin 1.5 tablets today and then continue taking 1 tablet daily. Recheck INR in 2 weeks. [facility]in Clinic [phone]

## 2022-10-23 ENCOUNTER — Ambulatory Visit: Payer: Managed Care, Other (non HMO) | Attending: Cardiovascular Disease | Admitting: *Deleted

## 2022-10-23 DIAGNOSIS — I824Y3 Acute embolism and thrombosis of unspecified deep veins of proximal lower extremity, bilateral: Secondary | ICD-10-CM | POA: Diagnosis not present

## 2022-10-23 DIAGNOSIS — Z7901 Long term (current) use of anticoagulants: Secondary | ICD-10-CM | POA: Diagnosis not present

## 2022-10-23 LAB — POCT INR: INR: 3.1 — AB (ref 2.0–3.0)

## 2022-10-23 NOTE — Patient Instructions (Signed)
Description   Today take 1/2 tablet then continue taking 1 tablet daily. Recheck INR in 3 weeks. [facility] Clinic [phone]

## 2022-11-11 ENCOUNTER — Ambulatory Visit: Payer: Managed Care, Other (non HMO) | Attending: Cardiovascular Disease

## 2022-11-11 DIAGNOSIS — I824Y3 Acute embolism and thrombosis of unspecified deep veins of proximal lower extremity, bilateral: Secondary | ICD-10-CM | POA: Diagnosis not present

## 2022-11-11 DIAGNOSIS — Z7901 Long term (current) use of anticoagulants: Secondary | ICD-10-CM | POA: Diagnosis not present

## 2022-11-11 DIAGNOSIS — I4891 Unspecified atrial fibrillation: Secondary | ICD-10-CM

## 2022-11-11 LAB — POCT INR: INR: 3 (ref 2.0–3.0)

## 2022-11-11 NOTE — Patient Instructions (Signed)
Description   Continue taking 1 tablet daily.  Recheck INR in 4 weeks.  [facility] Clinic [phone]

## 2022-12-11 ENCOUNTER — Ambulatory Visit: Payer: Managed Care, Other (non HMO) | Attending: Cardiovascular Disease | Admitting: *Deleted

## 2022-12-11 DIAGNOSIS — Z7901 Long term (current) use of anticoagulants: Secondary | ICD-10-CM | POA: Diagnosis not present

## 2022-12-11 DIAGNOSIS — I824Y3 Acute embolism and thrombosis of unspecified deep veins of proximal lower extremity, bilateral: Secondary | ICD-10-CM | POA: Diagnosis not present

## 2022-12-11 DIAGNOSIS — Z5181 Encounter for therapeutic drug level monitoring: Secondary | ICD-10-CM

## 2022-12-11 LAB — POCT INR: POC INR: 1.8

## 2022-12-11 NOTE — Patient Instructions (Signed)
Description   Take 1.5 tablets of warfarin today, then continue taking warfarin1 tablet daily.  Recheck INR in 3 weeks. Continue with 3 serving of vitamin K foods per week.  [facility] [phone]

## 2022-12-22 ENCOUNTER — Ambulatory Visit: Payer: Medicare Other | Admitting: Neurology

## 2023-01-01 ENCOUNTER — Ambulatory Visit: Payer: Managed Care, Other (non HMO) | Attending: Cardiology | Admitting: *Deleted

## 2023-01-01 DIAGNOSIS — I824Y3 Acute embolism and thrombosis of unspecified deep veins of proximal lower extremity, bilateral: Secondary | ICD-10-CM | POA: Diagnosis not present

## 2023-01-01 DIAGNOSIS — I4891 Unspecified atrial fibrillation: Secondary | ICD-10-CM | POA: Diagnosis not present

## 2023-01-01 DIAGNOSIS — Z7901 Long term (current) use of anticoagulants: Secondary | ICD-10-CM

## 2023-01-01 LAB — POCT INR: INR: 2.4 (ref 2.0–3.0)

## 2023-01-01 NOTE — Patient Instructions (Signed)
Description   Continue taking warfarin1 tablet daily. Recheck INR in 4 weeks. Continue with 3 serving of vitamin K foods per week. [facility]in Clinic [phone]

## 2023-01-28 ENCOUNTER — Other Ambulatory Visit: Payer: Self-pay | Admitting: Cardiovascular Disease

## 2023-01-28 DIAGNOSIS — I4891 Unspecified atrial fibrillation: Secondary | ICD-10-CM

## 2023-01-29 ENCOUNTER — Ambulatory Visit: Payer: Managed Care, Other (non HMO) | Attending: Internal Medicine | Admitting: Pharmacist

## 2023-01-29 DIAGNOSIS — I824Y9 Acute embolism and thrombosis of unspecified deep veins of unspecified proximal lower extremity: Secondary | ICD-10-CM | POA: Diagnosis not present

## 2023-01-29 DIAGNOSIS — I4891 Unspecified atrial fibrillation: Secondary | ICD-10-CM

## 2023-01-29 DIAGNOSIS — I824Y3 Acute embolism and thrombosis of unspecified deep veins of proximal lower extremity, bilateral: Secondary | ICD-10-CM

## 2023-01-29 DIAGNOSIS — Z7901 Long term (current) use of anticoagulants: Secondary | ICD-10-CM | POA: Diagnosis not present

## 2023-01-29 LAB — POCT INR: INR: 2.1 (ref 2.0–3.0)

## 2023-01-29 NOTE — Patient Instructions (Signed)
Description   Continue taking warfarin 1 tablet daily. Recheck INR in 5 weeks. Continue with 3 serving of vitamin K foods per week. [facility]in Clinic [phone]

## 2023-02-18 ENCOUNTER — Other Ambulatory Visit: Payer: Self-pay | Admitting: Cardiovascular Disease

## 2023-02-18 DIAGNOSIS — I4891 Unspecified atrial fibrillation: Secondary | ICD-10-CM

## 2023-03-05 ENCOUNTER — Ambulatory Visit: Payer: Managed Care, Other (non HMO) | Attending: Cardiovascular Disease | Admitting: *Deleted

## 2023-03-05 DIAGNOSIS — Z7901 Long term (current) use of anticoagulants: Secondary | ICD-10-CM | POA: Diagnosis not present

## 2023-03-05 DIAGNOSIS — I824Y3 Acute embolism and thrombosis of unspecified deep veins of proximal lower extremity, bilateral: Secondary | ICD-10-CM

## 2023-03-05 LAB — POCT INR: POC INR: 3

## 2023-03-05 NOTE — Patient Instructions (Signed)
Description   Continue taking warfarin 1 tablet daily. Recheck INR in 6 weeks. Continue with 3 serving of vitamin K foods per week. Coumadin Clinic [phone]

## 2023-03-16 ENCOUNTER — Ambulatory Visit: Payer: Managed Care, Other (non HMO) | Admitting: Neurology

## 2023-03-16 ENCOUNTER — Encounter: Payer: Self-pay | Admitting: Neurology

## 2023-03-16 VITALS — BP 145/74 | HR 82 | Ht 65.0 in | Wt 155.0 lb

## 2023-03-16 DIAGNOSIS — R42 Dizziness and giddiness: Secondary | ICD-10-CM

## 2023-03-16 DIAGNOSIS — G40909 Epilepsy, unspecified, not intractable, without status epilepticus: Secondary | ICD-10-CM

## 2023-03-16 DIAGNOSIS — G9389 Other specified disorders of brain: Secondary | ICD-10-CM

## 2023-03-16 DIAGNOSIS — R2689 Other abnormalities of gait and mobility: Secondary | ICD-10-CM

## 2023-03-16 DIAGNOSIS — R269 Unspecified abnormalities of gait and mobility: Secondary | ICD-10-CM

## 2023-03-16 DIAGNOSIS — G629 Polyneuropathy, unspecified: Secondary | ICD-10-CM

## 2023-03-16 NOTE — Patient Instructions (Signed)
Continue current medications  Referral to vestibular rehab  Return in a year or sooner if worse

## 2023-03-16 NOTE — Progress Notes (Signed)
[facility]  PATIENT: Evelyn [name] DOB: [birth year]  REQUESTING CLINICIAN: Prince Solian, MD HISTORY FROM: Patient and husband  REASON FOR VISIT: Dizziness and unsteadiness    HISTORICAL  CHIEF COMPLAINT:  Chief Complaint  Patient presents with   Follow-up    Rm 15,  States balance has not improved    INTERVAL HISTORY 03/16/2023:  Patient presents today for follow-up, she is accompanied by her husband.  Since last visit, she reports that her balance issue has not improved.  She is still unsteady.  Denies any falls.  She also is aware that her fluid intake is not what it should be. In terms of the seizures, she is doing well on lamotrigine, no seizures no side effect from the medication.  Again no falls.   HISTORY OF PRESENT ILLNESS:  This is a 77 year old woman past medical history of seizure disorder, well controlled on lamotrigine, headaches, insomnia, who is presenting with balance problem for the past 2 years.  Patient reports for the past 2 years she had balance problem described as unsteadiness, difficulty with walking.  She denies any room spinning sensation.  She report during this time she had 2 falls, the first 1 was on October 11, 2021.  She was walking and suddenly fell forward like someone pushed her from her back.  The last fall was on July 10.  She reports she was walking and felt like she could not pick up her foot and she fell to the ground.  Denies any major hand injuries with these falls.  She has follow-up with a neurologist in [place], had extensive work-up including multiple MRIs and CT scans (showing ventriculomegaly ?NPH), VNG and they were all within normal limits.    OTHER MEDICAL CONDITIONS: Seizure disorder, Headaches   REVIEW OF SYSTEMS: Full 14 system review of systems performed and negative with exception of: As noted in the HPI   ALLERGIES: Allergies  Allergen Reactions   Tegretol [Carbamazepine] Rash    HOME  MEDICATIONS: Outpatient Medications Prior to Visit  Medication Sig Dispense Refill   acetaminophen (TYLENOL) 500 MG tablet Take 500 mg by mouth every 6 (six) hours as needed.     Calcium Citrate-Vitamin D (CALCIUM CITRATE + D PO) Take 1 tablet by mouth 2 (two) times daily with a meal.     cetirizine (ZYRTEC) 10 MG tablet Take 10 mg by mouth daily.     Coenzyme Q10 50 MG CAPS Take 1 capsule by mouth every morning.     Cyanocobalamin (B-12) 2000 MCG TABS Take 1 tablet by mouth daily.     HYDROcodone-acetaminophen (NORCO/VICODIN) 5-325 MG tablet Take 1 tablet by mouth every 6 (six) hours as needed for moderate pain.     lamoTRIgine (LAMICTAL) 100 MG tablet Take 50 mg by mouth 2 (two) times daily. Takes with 200mg  tablets for a total dose of 250mg  twice a day     lamoTRIgine (LAMICTAL) 200 MG tablet Take by mouth 2 (two) times daily. Takes with 50mg  tablets for a total dose of 250mg  twice a day     mirtazapine (REMERON) 7.5 MG tablet Take 3.75-7.5 mg by mouth at bedtime.     Multiple Vitamin (MULTIVITAMIN WITH MINERALS) TABS tablet Take 1 tablet by mouth 2 (two) times daily with a meal.     Omega-3 Fatty Acids (OMEGA 3 PO) Take 1 capsule by mouth every morning.     polyethylene glycol (MIRALAX) 17 g packet Take 17 g by mouth 2 (two) times  daily. 14 each 0   warfarin (COUMADIN) 7.5 MG tablet TAKE 1 TO 2 TABLETS BY MOUTH DAILY AS DIRECTED BY COUMADIN CLINIC 50 tablet 0   No facility-administered medications prior to visit.    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)    History of echocardiogram 03/11/09   nl EF 60-65%   History of recurrent UTIs    feels like may have one now   PE (pulmonary embolism) 2005   Peripheral vascular disease    dvt leg rt   Seizures     PAST SURGICAL  HISTORY: Past Surgical History:  Procedure Laterality Date   arm surgery Right    INSERTION OF VENA CAVA FILTER  10   KNEE SURGERY Left 96   kneecap fx   ORIF HUMERUS FRACTURE Right 08/01/2013   Procedure: NON-UNION REPAIR RIGHT PROXIMAL HUMERUS FRACTURE;  Surgeon: Rozanna Box, MD;  Location: [place];  Service: Orthopedics;  Laterality: Right;    FAMILY HISTORY: Family History  Problem Relation Age of Onset   Diabetes Mother    Stomach cancer Mother    Stroke Father    Esophageal cancer Neg Hx    Colon cancer Neg Hx    Liver disease Neg Hx    Pancreatic cancer Neg Hx     SOCIAL HISTORY: Social History   Socioeconomic History   Marital status: Married    Spouse name: Not on file   Number of children: Not on file   Years of education: Not on file   Highest education level: Not on file  Occupational History   Not on file  Tobacco Use   Smoking status: Never   Smokeless tobacco: Never  Substance and Sexual Activity   Alcohol use: No   Drug use: No   Sexual activity: Not on file  Other Topics Concern   Not on file  Social History Narrative   Not on file   Social Determinants of Health   Financial Resource Strain: Not on file  Food Insecurity: Not on file  Transportation Needs: Not on file  Physical Activity: Not on file  Stress: Not on file  Social Connections: Not on file  Intimate Partner Violence: Not on file    PHYSICAL EXAM  GENERAL EXAM/CONSTITUTIONAL: Vitals:  Vitals:   03/16/23 1559  BP: (!) 145/74  Pulse: 82  Weight: 155 lb (70.3 kg)  Height: 5\' 5"  (1.651 m)    Body mass index is 25.79 kg/m. Wt Readings from Last 3 Encounters:  03/16/23 155 lb (70.3 kg)  09/25/22 155 lb (70.3 kg)  08/28/22 155 lb 3.2 oz (70.4 kg)   Patient is in no distress; well developed, nourished and groomed; neck is supple  EYES: Visual fields full to confrontation, Extraocular movements intacts,   MUSCULOSKELETAL: Gait, strength, tone, movements noted in  Neurologic exam below  NEUROLOGIC: MENTAL STATUS:      No data to display         awake, alert, oriented to person, place and time recent and remote memory intact normal attention and concentration language fluent, comprehension intact, naming intact fund of knowledge appropriate  CRANIAL NERVE:  2nd, 3rd, 4th, 6th - visual fields full to confrontation, extraocular muscles intact, no nystagmus 5th - facial sensation symmetric 7th -  facial strength symmetric 8th - hearing intact 9th - palate elevates symmetrically, uvula midline 11th - shoulder shrug symmetric 12th - tongue protrusion midline  MOTOR:  normal bulk and tone, full strength in the BUE, BLE  SENSORY:  normal and symmetric to light touch, pinprick and decrease vibration up to mid shin   COORDINATION:  finger-nose-finger, fine finger movements normal  REFLEXES:  deep tendon reflexes present and symmetric  GAIT/STATION:  Normal, no evidence of magnetic gait. Unable to tandem, positive Romberg.    DIAGNOSTIC DATA (LABS, IMAGING, TESTING) - I reviewed patient records, labs, notes, testing and imaging myself where available.  Lab Results  Component Value Date   WBC 7.2 06/22/2022   HGB 11.3 (L) 06/22/2022   HCT 35.6 (L) 06/22/2022   MCV 86.4 06/22/2022   PLT 274 06/22/2022      Component Value Date/Time   NA 143 09/25/2022 1139   K 4.7 09/25/2022 1139   CL 102 09/25/2022 1139   CO2 25 09/25/2022 1139   GLUCOSE 119 (H) 09/25/2022 1139   GLUCOSE 129 (H) 06/22/2022 2119   BUN 25 09/25/2022 1139   CREATININE 0.83 09/25/2022 1139   CALCIUM 9.9 09/25/2022 1139   PROT 7.7 08/01/2021 1537   ALBUMIN 3.7 08/01/2021 1537   AST 30 08/01/2021 1537   ALT 12 08/01/2021 1537   ALKPHOS 100 08/01/2021 1537   BILITOT 0.4 08/01/2021 1537   GFRNONAA 41 (L) 06/22/2022 2119   GFRAA >60 12/29/2015 1026   Lab Results  Component Value Date   CHOL (H) 03/09/2009    245        ATP III CLASSIFICATION:  <200      mg/dL   Desirable  200-239  mg/dL   Borderline High  >=240    mg/dL   High          HDL 99 03/09/2009   LDLCALC (H) 03/09/2009    137        Total Cholesterol/HDL:CHD Risk Coronary Heart Disease Risk Table                     Men   Women  1/2 Average Risk   3.4   3.3  Average Risk       5.0   4.4  2 X Average Risk   9.6   7.1  3 X Average Risk  23.4   11.0        Use the calculated Patient Ratio above and the CHD Risk Table to determine the patient's CHD Risk.        ATP III CLASSIFICATION (LDL):  <100     mg/dL   Optimal  100-129  mg/dL   Near or Above                    Optimal  130-159  mg/dL   Borderline  160-189  mg/dL   High  >190     mg/dL   Very High   TRIG 47 03/09/2009   CHOLHDL 2.5 03/09/2009   No results found for: "HGBA1C" No results found for: "VITAMINB12" Lab Results  Component Value Date   TSH 2.567 08/01/2021   B12 level 1353 Folate 12.5  Head CT 06/22/22 1. No acute intracranial hemorrhage. 2. 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 3. No acute facial fracture   MRI Brain 08/26/21, MRV Head 08/26/21 There is no reduced parenchymal diffusivity. The basal cisterns are patent. No intracranial mass effect. Redemonstration  of mild supratentorial hydrocephalus. The size and configuration of the ventricles is not remarkably changed from December 2021.  1.  Redemonstrated mild supratentorial hydrocephalus. The size and configuration of the ventricles is unchanged from December 2021.  2. Mild burden of chronic small vessel ischemic disease.  3. No evidence of venous thrombosis.  VNG 11/18/21 Normal    ASSESSMENT AND PLAN  77 y.o. year old female with longstanding history of epilepsy who is well controlled on lamotrigine 250 mg twice daily, headaches, insomnia who is presenting for balance problem for the past 2 years.  Patient had multiple MRIs, CT scan, showing possible normal pressure hydrocephalus but no  acute abnormality.  Denies any cognitive deficit, no magnetic gait and urinary incontinence. On exam, she has decrease vibratory senses, positive Romberg and inability to tandem walk. Patient balance etiology likely multifactorial and large fiber neuropathy is also contributing. I will send her for vestibular rehab.  I will see her in a year for follow up for her epilepsy.      1. Balance problem   2. Gait abnormality   3. Dysequilibrium   4. Cerebral ventriculomegaly   5. Neuropathy   6. Seizure disorder       Patient Instructions  Continue current medications  Referral to vestibular rehab  Return in a year or sooner if worse   Orders Placed This Encounter  Procedures   PT vestibular rehab    No orders of the defined types were placed in this encounter.   Return in about 1 year (around 03/15/2024).   Alric Ran, MD 03/16/2023, 4:26 PM  [facility] [address] [place], [place] [address] [phone]

## 2023-03-17 ENCOUNTER — Other Ambulatory Visit: Payer: Self-pay | Admitting: Cardiovascular Disease

## 2023-03-17 DIAGNOSIS — I4891 Unspecified atrial fibrillation: Secondary | ICD-10-CM

## 2023-03-18 NOTE — Telephone Encounter (Signed)
Warfarin 7.5mg  refill DVT & PE Last INR 03/05/23 Last OV 08/28/22

## 2023-04-01 ENCOUNTER — Ambulatory Visit: Payer: Medicare Other | Admitting: Neurology

## 2023-04-07 ENCOUNTER — Telehealth: Payer: Self-pay | Admitting: Neurology

## 2023-04-07 NOTE — Telephone Encounter (Signed)
Spoke to referrals, they will work to get this ready for patient

## 2023-04-07 NOTE — Telephone Encounter (Signed)
Pt stated no one has contacted her for therapy that Dr. Teresa Coombs ordered for her.

## 2023-04-16 ENCOUNTER — Ambulatory Visit: Payer: Managed Care, Other (non HMO) | Attending: Cardiovascular Disease | Admitting: *Deleted

## 2023-04-16 DIAGNOSIS — Z7901 Long term (current) use of anticoagulants: Secondary | ICD-10-CM | POA: Diagnosis not present

## 2023-04-16 DIAGNOSIS — I824Y3 Acute embolism and thrombosis of unspecified deep veins of proximal lower extremity, bilateral: Secondary | ICD-10-CM | POA: Diagnosis not present

## 2023-04-16 DIAGNOSIS — Z5181 Encounter for therapeutic drug level monitoring: Secondary | ICD-10-CM | POA: Diagnosis not present

## 2023-04-16 LAB — POCT INR: POC INR: 4.2

## 2023-04-16 NOTE — Patient Instructions (Signed)
Description   Hold warfarin today. Tomorrow take 1/2 a tablet.  Then continue taking warfarin 1 tablet daily. Recheck INR in 2 weeks. Continue with 3 serving of vitamin K foods per week. [facility] [phone]

## 2023-05-03 ENCOUNTER — Ambulatory Visit: Payer: Managed Care, Other (non HMO) | Attending: Cardiovascular Disease | Admitting: Pharmacist

## 2023-05-03 DIAGNOSIS — Z7901 Long term (current) use of anticoagulants: Secondary | ICD-10-CM | POA: Diagnosis not present

## 2023-05-03 DIAGNOSIS — I824Y3 Acute embolism and thrombosis of unspecified deep veins of proximal lower extremity, bilateral: Secondary | ICD-10-CM | POA: Diagnosis not present

## 2023-05-03 DIAGNOSIS — I4891 Unspecified atrial fibrillation: Secondary | ICD-10-CM

## 2023-05-03 DIAGNOSIS — I824Y9 Acute embolism and thrombosis of unspecified deep veins of unspecified proximal lower extremity: Secondary | ICD-10-CM | POA: Diagnosis not present

## 2023-05-03 LAB — POCT INR: INR: 2.9 (ref 2.0–3.0)

## 2023-05-03 NOTE — Patient Instructions (Signed)
Description   Continue taking warfarin1 tablet daily. Recheck INR in 4 weeks. Continue with 3 serving of vitamin K foods per week. [facility] [phone]     

## 2023-05-04 ENCOUNTER — Encounter: Payer: Self-pay | Admitting: Neurology

## 2023-05-05 ENCOUNTER — Other Ambulatory Visit: Payer: Self-pay | Admitting: Neurology

## 2023-05-05 DIAGNOSIS — R2689 Other abnormalities of gait and mobility: Secondary | ICD-10-CM

## 2023-05-05 DIAGNOSIS — R269 Unspecified abnormalities of gait and mobility: Secondary | ICD-10-CM

## 2023-05-05 NOTE — Telephone Encounter (Signed)
New order for PT signed.

## 2023-05-26 ENCOUNTER — Ambulatory Visit: Payer: Managed Care, Other (non HMO) | Attending: Neurology | Admitting: Physical Therapy

## 2023-05-26 VITALS — BP 157/84 | HR 84

## 2023-05-26 DIAGNOSIS — R42 Dizziness and giddiness: Secondary | ICD-10-CM | POA: Insufficient documentation

## 2023-05-26 DIAGNOSIS — R2681 Unsteadiness on feet: Secondary | ICD-10-CM | POA: Diagnosis present

## 2023-05-26 DIAGNOSIS — R2689 Other abnormalities of gait and mobility: Secondary | ICD-10-CM | POA: Diagnosis present

## 2023-05-26 DIAGNOSIS — R269 Unspecified abnormalities of gait and mobility: Secondary | ICD-10-CM | POA: Diagnosis not present

## 2023-05-26 DIAGNOSIS — M6281 Muscle weakness (generalized): Secondary | ICD-10-CM | POA: Diagnosis present

## 2023-05-26 NOTE — Therapy (Signed)
OUTPATIENT PHYSICAL THERAPY NEURO EVALUATION   Patient Name: Evelyn [name] MRN: [id number] DOB:12-17-1945, 77 y.o., female Today's Date: 05/26/2023   PCP: Chilton Greathouse, MD REFERRING PROVIDER: Windell Norfolk, MD  END OF SESSION:  PT End of Session - 05/26/23 1448     Visit Number 1    Number of Visits 9   with eval   Date for PT Re-Evaluation 08/18/23   to allow for scheduling delays   Authorization Type [employer]    PT Start Time 1446    PT Stop Time 1530    PT Time Calculation (min) 44 min    Equipment Utilized During Treatment Gait belt    Activity Tolerance Patient tolerated treatment well    Behavior During Therapy [facility] for tasks assessed/performed             Past Medical History:  Diagnosis Date   DVT (deep venous thrombosis) (HCC) 2005   on coumadin; recurrent 2005, 2010; 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)    History of echocardiogram 03/11/09   nl EF 60-65%   History of recurrent UTIs    feels like may have one now   PE (pulmonary embolism) 2005   Peripheral vascular disease (HCC)    dvt leg rt   Seizures (HCC)    Past Surgical History:  Procedure Laterality Date   arm surgery Right    INSERTION OF VENA CAVA FILTER  10   KNEE SURGERY Left 96   kneecap fx   ORIF HUMERUS FRACTURE Right 08/01/2013   Procedure: NON-UNION REPAIR RIGHT PROXIMAL HUMERUS FRACTURE;  Surgeon: Budd Palmer, MD;  Location: [facility] OR;  Service: Orthopedics;  Laterality: Right;   Patient Active Problem List   Diagnosis Date Noted   Atrial fibrillation (HCC) 09/27/2020   Acute venous embolism and thrombosis of deep vessels of proximal lower extremity (HCC) 09/27/2020   Encounter for screening colonoscopy 07/13/2014   Chronic anticoagulation 07/13/2014   Acute pulmonary embolism (HCC) 05/10/2013   DVT (deep venous thrombosis), unspecified laterality 03/01/2013   DVT (deep venous thrombosis)  (HCC)     ONSET DATE: 05/05/2023 (referral date)  REFERRING DIAG: R26.9 (ICD-10-CM) - Gait abnormality R26.89 (ICD-10-CM) - Balance problem  THERAPY DIAG:  Muscle weakness (generalized)  Other abnormalities of gait and mobility  Unsteadiness on feet  Dizziness and giddiness  Rationale for Evaluation and Treatment: Rehabilitation  SUBJECTIVE:  SUBJECTIVE STATEMENT: Pt goes by "[name]"  Pt reports that over the past 2 years she has started to notice issues with her balance. Pt states that some days she wakes up and her balance just feels "off" and she feels like she might fall one way or the other. Pt reports that sometimes she has good days and can stand and walk normally but then the balance issues may hit her later in the day. Pt also reports that when she turns her head side to side while walking she gets dizzy, she finds herself running into walls or falling towards the wall. Pt has not tried physical therapy or vestibular therapy for her symptoms before.  Pt also appears to have difficulty standing up from lower surfaces. Pt really enjoys gardening and is frustrated that she is not able to do that right now with her symptoms.  Pt accompanied by: self and significant other husband Don  PERTINENT HISTORY:  Per Dr. Teresa Coombs note 03/16/23 This is a 77 year old woman past medical history of seizure disorder, well controlled on lamotrigine, headaches, insomnia, who is presenting with balance problem for the past 2 years. Patient reports for the past 2 years she had balance problem described as unsteadiness, difficulty with walking. She denies any room spinning sensation. She report during this time she had 2 falls, the first 1 was on October 11, 2021. She was walking and suddenly fell forward like someone  pushed her from her back. The last fall was on July 10. She reports she was walking and felt like she could not pick up her foot and she fell to the ground. Denies any major hand injuries with these falls. She has follow-up with a neurologist in [place], had extensive work-up including multiple MRIs and CT scans (showing ventriculomegaly ?NPH), VNG and they were all within normal limits.  PAIN:  Are you having pain? No  PRECAUTIONS: Fall  WEIGHT BEARING RESTRICTIONS: No  FALLS: Has patient fallen in last 6 months? No; but did have a fall in the last year and taken to ED by EMS but they did a check up and she was ok  LIVING ENVIRONMENT: Lives with: lives with their family Lives in: House/apartment Stairs: Yes: External: 4 steps; on right going up Has following equipment at home:  standard walker ; uses it when she gets out of bed first thing in the morning when she isn't sure how her balance is going to be that day  PLOF: Independent with gait, Independent with transfers, and Requires assistive device for independence  PATIENT GOALS: "to get over this balance"  OBJECTIVE:   VITALS: Vitals:   05/26/23 1502  BP: (!) 157/84  Pulse: 84     DIAGNOSTIC FINDINGS: None relevant to this POC.  COGNITION: Overall cognitive status: Within functional limits for tasks assessed   SENSATION: Light touch appears intact Per pt report she could not feel tuning fork in foot from her ankle down when Dr. Teresa Coombs was testing her (large fiber neuropathy)  COORDINATION: WFL  EDEMA:  Mild LE edema: LLE>RLE   LOWER EXTREMITY MMT:    MMT Right Eval Left Eval  Hip flexion 4 4  Hip extension    Hip abduction    Hip adduction    Hip internal rotation    Hip external rotation    Knee flexion 4 4  Knee extension 5 5  Ankle dorsiflexion 5 5  Ankle plantarflexion    Ankle inversion    Ankle eversion    (  Blank rows = not tested)  BED MOBILITY:  Independent per pt  report  TRANSFERS: Assistive device utilized: None  Sit to stand: SBA Stand to sit: SBA Chair to chair: SBA Floor:  not assessed at eval   STAIRS: Level of Assistance: SBA Stair Negotiation Technique: Alternating Pattern  with Bilateral Rails Number of Stairs: 4  Height of Stairs: 6  Comments: WFL  GAIT: Distance walked: various clinic distances Assistive device utilized: None Level of assistance: CGA Comments: some path deviation, decreased gait speed, occasional stumbling  FUNCTIONAL TESTS:    [facility] PT Assessment - 05/26/23 1504       Ambulation/Gait   Gait velocity 32.8 ft over 12.44 sec = 2.64 ft/sec      Standardized Balance Assessment   Standardized Balance Assessment Berg Balance Test;Timed Up and Go Test;Five Times Sit to Stand    Five times sit to stand comments  33.47 sec   needs BUE support on arms of chair     Timed Up and Go Test   TUG Normal TUG    Normal TUG (seconds) 13.22   no AD     Functional Gait  Assessment   Gait assessed  Yes    Gait Level Surface Walks 20 ft in less than 7 sec but greater than 5.5 sec, uses assistive device, slower speed, mild gait deviations, or deviates 6-10 in outside of the 12 in walkway width.    Change in Gait Speed Makes only minor adjustments to walking speed, or accomplishes a change in speed with significant gait deviations, deviates 10-15 in outside the 12 in walkway width, or changes speed but loses balance but is able to recover and continue walking.    Gait with Horizontal Head Turns Performs head turns with moderate changes in gait velocity, slows down, deviates 10-15 in outside 12 in walkway width but recovers, can continue to walk.    Gait with Vertical Head Turns Performs task with moderate change in gait velocity, slows down, deviates 10-15 in outside 12 in walkway width but recovers, can continue to walk.    Gait and Pivot Turn Turns slowly, requires verbal cueing, or requires several small steps to catch balance  following turn and stop    Step Over Obstacle Is able to step over one shoe box (4.5 in total height) but must slow down and adjust steps to clear box safely. May require verbal cueing.    Gait with Narrow Base of Support Ambulates 4-7 steps.    Gait with Eyes Closed Walks 20 ft, slow speed, abnormal gait pattern, evidence for imbalance, deviates 10-15 in outside 12 in walkway width. Requires more than 9 sec to ambulate 20 ft.    Ambulating Backwards Walks 20 ft, slow speed, abnormal gait pattern, evidence for imbalance, deviates 10-15 in outside 12 in walkway width.    Steps Alternating feet, must use rail.    Total Score 12    FGA comment: 12/30, high fall risk             mCTSIB:  Condition 1: 30 sec  Condition 2: 30 sec  Condition 3: 15 sec  Condition 4: 4 sec   TODAY'S TREATMENT:  PT Evaluation    PATIENT EDUCATION: Education details: Eval findings, PT POC Person educated: Patient and Spouse Education method: Explanation Education comprehension: verbalized understanding and needs further education  HOME EXERCISE PROGRAM: To be initiated  GOALS: Goals reviewed with patient? Yes  SHORT TERM GOALS: Target date: 06/26/2023   Pt will be independent with initial HEP for improved strength, balance, transfers and gait. Baseline: Goal status: INITIAL  2.  Pt will improve 5 x STS to less than or equal to 30 seconds to demonstrate improved functional strength and transfer efficiency.  Baseline: 33.47 sec (6/12) Goal status: INITIAL  3.  Pt will improve FGA to 15/30 for decreased fall risk  Baseline: 12/30 (6/12) Goal status: INITIAL  4.  Pt will improve Condition 4 of the mCTSIB to >/= 10 sec to demonstrate improved balance and decreased fall risk. Baseline: 4 sec (6/12) Goal status: INITIAL  5.  Vestibular system to be addressed and STG  set Baseline:  Goal status: INITIAL   LONG TERM GOALS: Target date: 07/24/2023    Pt will be independent with final HEP for improved strength, balance, transfers and gait. Baseline:  Goal status: INITIAL  2.  Pt will improve 5 x STS to less than or equal to 25 seconds to demonstrate improved functional strength and transfer efficiency.  Baseline: 33.47 sec (6/15) Goal status: INITIAL  3.  Pt will improve FGA to 18/30 for decreased fall risk  Baseline: 12/30 (6/12) Goal status: INITIAL  4.  Pt will improve Condition 4 of the mCTSIB to >/= 15 sec to demonstrate improved balance and decreased fall risk. Baseline: 4 sec (6/12) Goal status: INITIAL  5.  Vestibular system to be addressed and LTG set Baseline:  Goal status: INITIAL   ASSESSMENT:  CLINICAL IMPRESSION: Patient is a 77 year old female referred to Neuro [facility] for gait abnormalities and balance disorder.   Pt's PMH is significant for: Seizure disorder, Headaches. The following deficits were present during the exam: decreased LE strength and impaired balance. Based on her history of falls and balance impairments as well as her score on the 5xSTS, gait speed, FGA score and mCTSIB score, pt is an increased risk for falls. Pt would benefit from skilled PT to address these impairments and functional limitations to maximize functional mobility independence.   OBJECTIVE IMPAIRMENTS: Abnormal gait, decreased balance, decreased knowledge of condition, difficulty walking, decreased strength, dizziness, and impaired perceived functional ability.   ACTIVITY LIMITATIONS: carrying, lifting, bending, squatting, stairs, and transfers  PARTICIPATION LIMITATIONS: driving, community activity, and yard work  PERSONAL FACTORS: Age, Time since onset of injury/illness/exacerbation, and 1-2 comorbidities:    Seizure disorder, Headachesare also affecting patient's functional outcome.   REHAB POTENTIAL: Good  CLINICAL DECISION MAKING:  Stable/uncomplicated  EVALUATION COMPLEXITY: Low  PLAN:  PT FREQUENCY: 1x/week  PT DURATION: 8 weeks  PLANNED INTERVENTIONS: Therapeutic exercises, Therapeutic activity, Neuromuscular re-education, Balance training, Gait training, Patient/Family education, Self Care, Joint mobilization, Vestibular training, Canalith repositioning, Visual/preceptual remediation/compensation, and Re-evaluation  PLAN FOR NEXT SESSION: assess vestibular system and write STG/LTG; initiate HEP to address vestibular system impairments as well as for balance   Peter Congo, PT, DPT, CSRS 05/26/2023, 3:32 PM

## 2023-05-28 ENCOUNTER — Ambulatory Visit: Payer: Managed Care, Other (non HMO)

## 2023-06-03 ENCOUNTER — Encounter: Payer: Self-pay | Admitting: Physical Therapy

## 2023-06-03 ENCOUNTER — Telehealth: Payer: Self-pay | Admitting: Neurology

## 2023-06-03 ENCOUNTER — Ambulatory Visit: Payer: Managed Care, Other (non HMO) | Admitting: Physical Therapy

## 2023-06-03 DIAGNOSIS — R42 Dizziness and giddiness: Secondary | ICD-10-CM

## 2023-06-03 DIAGNOSIS — M6281 Muscle weakness (generalized): Secondary | ICD-10-CM

## 2023-06-03 DIAGNOSIS — R2681 Unsteadiness on feet: Secondary | ICD-10-CM

## 2023-06-03 DIAGNOSIS — R2689 Other abnormalities of gait and mobility: Secondary | ICD-10-CM

## 2023-06-03 MED ORDER — LAMOTRIGINE 100 MG PO TABS: 50.0000 mg | ORAL_TABLET | Freq: Two times a day (BID) | ORAL | 0 refills | Status: DC

## 2023-06-03 MED ORDER — LAMOTRIGINE 200 MG PO TABS: 200.0000 mg | ORAL_TABLET | Freq: Two times a day (BID) | ORAL | 0 refills | Status: DC

## 2023-06-03 NOTE — Telephone Encounter (Signed)
Requested Prescriptions   Pending Prescriptions Disp Refills   HYDROcodone-acetaminophen (NORCO/VICODIN) 5-325 MG tablet 30 tablet 0    Sig: Take 1 tablet by mouth every 6 (six) hours as needed for moderate pain.   Last seen 03/16/23, next appt scheduled 03/23/24 It doesn't look like Evelyn [name] had been the one providing this for her. I called the pt and left a msg for her to call us back to let her know that :  we are not going to send in hydrocodone but we have sent in the lamictal  Routing to phone room to notify the pt

## 2023-06-03 NOTE — Therapy (Signed)
OUTPATIENT PHYSICAL THERAPY NEURO TREATMENT   Patient Name: Evelyn [name] MRN: [id number] DOB:1946-03-07, 77 y.o., female Today's Date: 06/04/2023   PCP: Chilton Greathouse, MD REFERRING PROVIDER: Windell Norfolk, MD  END OF SESSION:  PT End of Session - 06/03/23 1620     Visit Number 2    Number of Visits 9   with eval   Date for PT Re-Evaluation 08/18/23   to allow for scheduling delays   Authorization Type [employer]    PT Start Time 1618    PT Stop Time 1700    PT Time Calculation (min) 42 min    Equipment Utilized During Treatment Gait belt    Activity Tolerance Patient tolerated treatment well    Behavior During Therapy [facility] for tasks assessed/performed             Past Medical History:  Diagnosis Date   DVT (deep venous thrombosis) (HCC) 2005   on coumadin; recurrent 2005, 2010; 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, but unable to be removed, being treated by Dr Isaiah Serge at [facility])    History of echocardiogram 03/11/09   nl EF 60-65%   History of recurrent UTIs    feels like may have one now   PE (pulmonary embolism) 2005   Peripheral vascular disease (HCC)    dvt leg rt   Seizures (HCC)    Past Surgical History:  Procedure Laterality Date   arm surgery Right    INSERTION OF VENA CAVA FILTER  10   KNEE SURGERY Left 96   kneecap fx   ORIF HUMERUS FRACTURE Right 08/01/2013   Procedure: NON-UNION REPAIR RIGHT PROXIMAL HUMERUS FRACTURE;  Surgeon: Budd Palmer, MD;  Location: [facility] OR;  Service: Orthopedics;  Laterality: Right;   Patient Active Problem List   Diagnosis Date Noted   Atrial fibrillation (HCC) 09/27/2020   Acute venous embolism and thrombosis of deep vessels of proximal lower extremity (HCC) 09/27/2020   Encounter for screening colonoscopy 07/13/2014   Chronic anticoagulation 07/13/2014   Acute pulmonary embolism (HCC) 05/10/2013   DVT (deep venous thrombosis), unspecified laterality 03/01/2013   DVT (deep venous thrombosis) (HCC)      ONSET DATE: 05/05/2023 (referral date)  REFERRING DIAG: R26.9 (ICD-10-CM) - Gait abnormality R26.89 (ICD-10-CM) - Balance problem  THERAPY DIAG:  Muscle weakness (generalized)  Other abnormalities of gait and mobility  Unsteadiness on feet  Dizziness and giddiness  Rationale for Evaluation and Treatment: Rehabilitation  SUBJECTIVE:  SUBJECTIVE STATEMENT: Pt goes by "[name]"  Reports the past 3 days was pretty good. Was feeling normal again. Doing some leg exercises at home.   Pt accompanied by: self and significant other husband Don  PERTINENT HISTORY:  Per Dr. Teresa Coombs note 03/16/23 This is a 77 year old woman past medical history of seizure disorder, well controlled on lamotrigine, headaches, insomnia, who is presenting with balance problem for the past 2 years. Patient reports for the past 2 years she had balance problem described as unsteadiness, difficulty with walking. She denies any room spinning sensation. She report during this time she had 2 falls, the first 1 was on October 11, 2021. She was walking and suddenly fell forward like someone pushed her from her back. The last fall was on July 10. She reports she was walking and felt like she could not pick up her foot and she fell to the ground. Denies any major hand injuries with these falls. She has follow-up with a neurologist in [place], had extensive work-up including multiple MRIs and CT scans (showing ventriculomegaly ?NPH), VNG and they were all within normal limits.  PAIN:  Are you having pain? No  PRECAUTIONS: Fall  WEIGHT BEARING RESTRICTIONS: No  FALLS: Has patient fallen in last 6 months? No; but did have a fall in the last year and taken to ED by EMS but they did a check up and she was ok  LIVING ENVIRONMENT: Lives with:  lives with their family Lives in: House/apartment Stairs: Yes: External: 4 steps; on right going up Has following equipment at home:  standard walker ; uses it when she gets out of bed first thing in the morning when she isn't sure how her balance is going to be that day  PLOF: Independent with gait, Independent with transfers, and Requires assistive device for independence  PATIENT GOALS: "to get over this balance"  OBJECTIVE:   VITALS: There were no vitals filed for this visit.    DIAGNOSTIC FINDINGS: None relevant to this POC.  COGNITION: Overall cognitive status: Within functional limits for tasks assessed   SENSATION: Light touch appears intact Per pt report she could not feel tuning fork in foot from her ankle down when Dr. Teresa Coombs was testing her (large fiber neuropathy)  COORDINATION: WFL  EDEMA:  Mild LE edema: LLE>RLE   LOWER EXTREMITY MMT:    MMT Right Eval Left Eval  Hip flexion 4 4  Hip extension    Hip abduction    Hip adduction    Hip internal rotation    Hip external rotation    Knee flexion 4 4  Knee extension 5 5  Ankle dorsiflexion 5 5  Ankle plantarflexion    Ankle inversion    Ankle eversion    (Blank rows = not tested)  BED MOBILITY:  Independent per pt report  TRANSFERS: Assistive device utilized: None  Sit to stand: SBA Stand to sit: SBA Chair to chair: SBA Floor:  not assessed at eval   STAIRS: Level of Assistance: SBA Stair Negotiation Technique: Alternating Pattern  with Bilateral Rails Number of Stairs: 4  Height of Stairs: 6  Comments: WFL  GAIT: Distance walked: various clinic distances Assistive device utilized: None Level of assistance: CGA Comments: some path deviation, decreased gait speed, occasional stumbling    TODAY'S TREATMENT:  VESTIBULAR ASSESSMENT   GENERAL OBSERVATION:  Ambulates in with no AD.     SYMPTOM BEHAVIOR:   Subjective history: Reports balance and dizziness episodes started about 2 years ago. Gets dizzy when standing up and turning too quickly. Denies any room spinning.     Non-Vestibular symptoms:  N/A   Type of dizziness: Imbalance (Disequilibrium) and "wobbly and veering off to the side"    Frequency: Everyday, has to hold onto her husband for balance. When she gets up in the morning, have to have her walker for balance. Worse when having a busy day with a lot of walking.    Duration: Constant due to imbalance    Aggravating factors: Induced by motion: turning head quickly and doing a lot of walking    Relieving factors: no known relieving factors   Progression of symptoms: better   OCULOMOTOR EXAM:   Ocular Alignment: normal   Ocular ROM: No Limitations   Spontaneous Nystagmus: absent   Gaze-Induced Nystagmus: absent   Smooth Pursuits: intact   Saccades: intact      VESTIBULAR - OCULAR REFLEX:    Slow VOR: Normal   VOR Cancellation: Normal   Head-Impulse Test: HIT Right: positive HIT Left: negative   Dynamic Visual Acuity: Static: Line 9 Dynamic: Unable to accurately assess as pt with incr guarding of neck     POSITIONAL TESTING: Right Dix-Hallpike: no nystagmus Left Dix-Hallpike: no nystagmus Right Roll Test: no nystagmus Left Roll Test: no nystagmus    MOTION SENSITIVITY:    Motion Sensitivity Quotient  Intensity: 0 = none, 1 = Lightheaded, 2 = Mild, 3 = Moderate, 4 = Severe, 5 = Vomiting  Intensity  1. Sitting to supine 0  2. Supine to L side 0  3. Supine to R side 0  4. Supine to sitting 0  5. L Hallpike-Dix 0  6. Up from L  0  7. R Hallpike-Dix 0  8. Up from R  0  9. Sitting, head  tipped to L knee   10. Head up from L  knee   11. Sitting, head  tipped to R knee   12. Head up from R  knee   13. Sitting head turns x5   14.Sitting head nods x5   15. In stance, 180  turn to L    16. In stance, 180   turn to R      Access Code: [email or web address] URL: [email or web address] Date: 06/03/2023 Prepared by: Sherlie Ban  Initiated HEP for balance, vestibular input. See MedBridge for further details.   Exercises - Romberg Stance with Head Rotation  - 1-2 x daily - 5 x weekly - 2 sets - 10 reps - with slight space between feet, 2 x10 reps head turns, 2 x 10 reps head nods  - Wide Stance with Eyes Closed on Foam Pad  - 1-2 x daily - 5 x weekly - 3 sets - 30 hold  - Tandem Stance  - 1-2 x daily - 5 x weekly - 3 sets - 15-20 hold - intermittent taps for balance   PATIENT EDUCATION: Education details: Clinical findings based on vestibular assessment, explanation of what BPPV is and that pt is negative for positional testing, initial HEP for balance  Person educated: Patient and Spouse Education method: Explanation, Demonstration, Verbal cues, and Handouts Education comprehension: verbalized understanding and needs further education  HOME EXERCISE PROGRAM: Access Code: [email or web address] URL: [email or web address] Date: 06/03/2023 Prepared by: Sherlie Ban  Exercises - Romberg Stance with Head  Rotation  - 1-2 x daily - 5 x weekly - 2 sets - 10 reps - Wide Stance with Eyes Closed on Foam Pad  - 1-2 x daily - 5 x weekly - 3 sets - 30 hold - Tandem Stance  - 1-2 x daily - 5 x weekly - 3 sets - 15-20 hold  GOALS: Goals reviewed with patient? Yes  SHORT TERM GOALS: Target date: 06/26/2023   Pt will be independent with initial HEP for improved strength, balance, transfers and gait. Baseline: Goal status: INITIAL  2.  Pt will improve 5 x STS to less than or equal to 30 seconds to demonstrate improved functional strength and transfer efficiency.  Baseline: 33.47 sec (6/12) Goal status: INITIAL  3.  Pt will improve FGA to 15/30 for decreased fall risk  Baseline: 12/30 (6/12) Goal status: INITIAL  4.  Pt will improve Condition 4 of the mCTSIB to >/= 10 sec to  demonstrate improved balance and decreased fall risk. Baseline: 4 sec (6/12) Goal status: INITIAL  5.  Vestibular system to be addressed and STG set Baseline:  Goal status: INITIAL   LONG TERM GOALS: Target date: 07/24/2023    Pt will be independent with final HEP for improved strength, balance, transfers and gait. Baseline:  Goal status: INITIAL  2.  Pt will improve 5 x STS to less than or equal to 25 seconds to demonstrate improved functional strength and transfer efficiency.  Baseline: 33.47 sec (6/15) Goal status: INITIAL  3.  Pt will improve FGA to 18/30 for decreased fall risk  Baseline: 12/30 (6/12) Goal status: INITIAL  4.  Pt will improve Condition 4 of the mCTSIB to >/= 15 sec to demonstrate improved balance and decreased fall risk. Baseline: 4 sec (6/12) Goal status: INITIAL  5.  Vestibular system to be addressed and LTG set Baseline:  Goal status: INITIAL   ASSESSMENT:  CLINICAL IMPRESSION: Today's skilled session focused on further vestibular assessment due to dizziness/imbalance. Pt demonstrating a positive HIT to the R, indicating a vestibular hypofunction. Unable to accurately assess DVA due to pt having incr guarding of neck. Pt negative for BPPV testing with DixHallpike and roll test. 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 Pt also reports having neuropathy that can affect her balance as well. Focused on initiating HEP for standing balance for narrow BOS and vestibular input for balance. Pt reporting no dizziness, just feeling more off balance with exercises, but able to tolerate well. Will continue per POC.   OBJECTIVE IMPAIRMENTS: Abnormal gait, decreased balance, decreased knowledge of condition, difficulty walking, decreased strength, dizziness, and impaired perceived functional ability.   ACTIVITY LIMITATIONS: carrying, lifting, bending, squatting, stairs, and transfers  PARTICIPATION LIMITATIONS: driving,  community activity, and yard work  PERSONAL FACTORS: Age, Time since onset of injury/illness/exacerbation, and 1-2 comorbidities:    Seizure disorder, Headachesare also affecting patient's functional outcome.   REHAB POTENTIAL: Good  CLINICAL DECISION MAKING: Stable/uncomplicated  EVALUATION COMPLEXITY: Low  PLAN:  PT FREQUENCY: 1x/week  PT DURATION: 8 weeks  PLANNED INTERVENTIONS: Therapeutic exercises, Therapeutic activity, Neuromuscular re-education, Balance training, Gait training, Patient/Family education, Self Care, Joint mobilization, Vestibular training, Canalith repositioning, Visual/preceptual remediation/compensation, and Re-evaluation  PLAN FOR NEXT SESSION: add to HEP to address vestibular system impairments as well as for balance, VOR exercises, continue balance, esp unlevel surfaces, vestibular input    Drake Leach, PT, DPT 06/04/2023, 12:26 PM

## 2023-06-03 NOTE — Telephone Encounter (Signed)
Pt husband (on [place]) came into office requesting a refill of HYDROcodone-acetaminophen (NORCO/VICODIN) 5-325 MG tablet , previously prescribed by Dr. Lyda Perone at [facility] to [facility] [phone]

## 2023-06-04 ENCOUNTER — Ambulatory Visit: Payer: Managed Care, Other (non HMO) | Attending: Cardiovascular Disease | Admitting: *Deleted

## 2023-06-04 DIAGNOSIS — I824Y9 Acute embolism and thrombosis of unspecified deep veins of unspecified proximal lower extremity: Secondary | ICD-10-CM

## 2023-06-04 DIAGNOSIS — I824Y3 Acute embolism and thrombosis of unspecified deep veins of proximal lower extremity, bilateral: Secondary | ICD-10-CM

## 2023-06-04 DIAGNOSIS — Z7901 Long term (current) use of anticoagulants: Secondary | ICD-10-CM | POA: Diagnosis not present

## 2023-06-04 DIAGNOSIS — I4891 Unspecified atrial fibrillation: Secondary | ICD-10-CM | POA: Diagnosis not present

## 2023-06-04 LAB — POCT INR: INR: 3.9 — AB (ref 2.0–3.0)

## 2023-06-04 NOTE — Patient Instructions (Signed)
Description   Do not take any warfarin today then continue taking warfarin 1 tablet daily. Recheck INR in 3 weeks. Continue with 3 serving of vitamin K foods per week. [facility] [phone]

## 2023-06-07 NOTE — Telephone Encounter (Signed)
Return call to patient. No answer. Left message stating that lamictal was refilled but Dr. Teresa Coombs is not refilling hydrocodone. Advised to call office with any questions or concerns.

## 2023-06-07 NOTE — Telephone Encounter (Signed)
Routing to the phone room to try to have them call for a second attempt to say:  we are not going to send in hydrocodone but we have sent in the lamictal

## 2023-06-07 NOTE — Telephone Encounter (Signed)
Pt was called again and VM was left for her to call when she gets a chance.

## 2023-06-10 ENCOUNTER — Encounter: Payer: Self-pay | Admitting: Physical Therapy

## 2023-06-10 ENCOUNTER — Ambulatory Visit: Payer: Managed Care, Other (non HMO) | Admitting: Physical Therapy

## 2023-06-10 VITALS — BP 133/84 | HR 80

## 2023-06-10 DIAGNOSIS — M6281 Muscle weakness (generalized): Secondary | ICD-10-CM

## 2023-06-10 DIAGNOSIS — R42 Dizziness and giddiness: Secondary | ICD-10-CM

## 2023-06-10 DIAGNOSIS — R2681 Unsteadiness on feet: Secondary | ICD-10-CM

## 2023-06-10 DIAGNOSIS — R2689 Other abnormalities of gait and mobility: Secondary | ICD-10-CM

## 2023-06-10 NOTE — Therapy (Signed)
OUTPATIENT PHYSICAL THERAPY NEURO TREATMENT   Patient Name: Evelyn [name] MRN: [id number] DOB:[birth year], 77 y.o., female Today's Date: 06/10/2023   PCP: Chilton Greathouse, MD REFERRING PROVIDER: Windell Norfolk, MD  END OF SESSION:  PT End of Session - 06/10/23 1618     Visit Number 3    Number of Visits 9    Date for PT Re-Evaluation 08/18/23    Authorization Type [employer]    PT Start Time 1616    PT Stop Time 1701    PT Time Calculation (min) 45 min    Equipment Utilized During Treatment Gait belt    Activity Tolerance Patient tolerated treatment well    Behavior During Therapy WFL for tasks assessed/performed             Past Medical History:  Diagnosis Date   DVT (deep venous thrombosis) (HCC) 2005   on coumadin; recurrent 2005, 2010; 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, but unable to be removed, being treated by Dr Isaiah Serge at [facility])    History of echocardiogram 03/11/09   nl EF 60-65%   History of recurrent UTIs    feels like may have one now   PE (pulmonary embolism) 2005   Peripheral vascular disease (HCC)    dvt leg rt   Seizures (HCC)    Past Surgical History:  Procedure Laterality Date   arm surgery Right    INSERTION OF VENA CAVA FILTER  10   KNEE SURGERY Left 96   kneecap fx   ORIF HUMERUS FRACTURE Right 08/01/2013   Procedure: NON-UNION REPAIR RIGHT PROXIMAL HUMERUS FRACTURE;  Surgeon: Budd Palmer, MD;  Location: [facility] OR;  Service: Orthopedics;  Laterality: Right;   Patient Active Problem List   Diagnosis Date Noted   Atrial fibrillation (HCC) 09/27/2020   Acute venous embolism and thrombosis of deep vessels of proximal lower extremity (HCC) 09/27/2020   Encounter for screening colonoscopy 07/13/2014   Chronic anticoagulation 07/13/2014   Acute pulmonary embolism (HCC) 05/10/2013   DVT (deep venous thrombosis), unspecified laterality 03/01/2013   DVT (deep venous thrombosis) (HCC)     ONSET DATE: 05/05/2023 (referral  date)  REFERRING DIAG: R26.9 (ICD-10-CM) - Gait abnormality R26.89 (ICD-10-CM) - Balance problem  THERAPY DIAG:  Muscle weakness (generalized)  Other abnormalities of gait and mobility  Unsteadiness on feet  Dizziness and giddiness  Rationale for Evaluation and Treatment: Rehabilitation  SUBJECTIVE:  SUBJECTIVE STATEMENT: Pt goes by "Evelyn [name]"  Reports that today she has felt more off balance. Patient had to stabilize herself on her husband walking into the clinic. Denies falls/near falls.   Pt accompanied by: self and significant other husband Don  PERTINENT HISTORY:  Per Dr. Teresa Coombs note 03/16/23 This is a 77 year old woman past medical history of seizure disorder, well controlled on lamotrigine, headaches, insomnia, who is presenting with balance problem for the past 2 years. Patient reports for the past 2 years she had balance problem described as unsteadiness, difficulty with walking. She denies any room spinning sensation. She report during this time she had 2 falls, the first 1 was on October 11, 2021. She was walking and suddenly fell forward like someone pushed her from her back. The last fall was on July 10. She reports she was walking and felt like she could not pick up her foot and she fell to the ground. Denies any major hand injuries with these falls. She has follow-up with a neurologist in [place], had extensive work-up including multiple MRIs and CT scans (showing ventriculomegaly ?NPH), VNG and they were all within normal limits.  PAIN:  Are you having pain? No  PRECAUTIONS: Fall  WEIGHT BEARING RESTRICTIONS: No  FALLS: Has patient fallen in last 6 months? No; but did have a fall in the last year and taken to ED by EMS but they did a check up and she was ok  LIVING  ENVIRONMENT: Lives with: lives with their family Lives in: House/apartment Stairs: Yes: External: 4 steps; on right going up Has following equipment at home:  standard walker ; uses it when she gets out of bed first thing in the morning when she isn't sure how her balance is going to be that day  PLOF: Independent with gait, Independent with transfers, and Requires assistive device for independence  PATIENT GOALS: "to get over this balance"  OBJECTIVE:   VITALS: Vitals:   06/10/23 1627  BP: 133/84  Pulse: 80    DIAGNOSTIC FINDINGS: None relevant to this POC.  COGNITION: Overall cognitive status: Within functional limits for tasks assessed   SENSATION: Light touch appears intact Per pt report she could not feel tuning fork in foot from her ankle down when Dr. Teresa Coombs was testing her (large fiber neuropathy)  COORDINATION: WFL  EDEMA:  Mild LE edema: LLE>RLE   LOWER EXTREMITY MMT:    MMT Right Eval Left Eval  Hip flexion 4 4  Hip extension    Hip abduction    Hip adduction    Hip internal rotation    Hip external rotation    Knee flexion 4 4  Knee extension 5 5  Ankle dorsiflexion 5 5  Ankle plantarflexion    Ankle inversion    Ankle eversion    (Blank rows = not tested)  BED MOBILITY:  Independent per pt report  TRANSFERS: Assistive device utilized: None  Sit to stand: SBA Stand to sit: SBA Chair to chair: SBA Floor:  not assessed at eval   STAIRS: Level of Assistance: SBA Stair Negotiation Technique: Alternating Pattern  with Bilateral Rails Number of Stairs: 4  Height of Stairs: 6  Comments: WFL  GAIT: Distance walked: various clinic distances Assistive device utilized: None Level of assistance: CGA Comments: some path deviation, decreased gait speed, occasional stumbling    TODAY'S TREATMENT:  NMR:  Gaze  Adaptation: x1 Viewing Horizontal: Position: attempted standing first then regressed to seated, Time: 30 sec, Reps: 1 standing, 2 seated, and Comment: has eyes intermittently get thrown off to right, educated on pacing and ROM and x1 Viewing Vertical:  Position: seated, Time: 30 sec, Reps: 3, and Comment: eyes intermittently thrown of target, reports increased difficulty with vertical  Corner Balance (requires minA frequently to prevent posterior/lateral LOB otherwise SBA):  EO on foam shoulder width apart peforming hart chart diagonal reads 1 x down each (frequent posterior LOB, reports mild dizziness, takes pauses as needed to reset) EO on foam NBOS balance 3 x 10-30"  EC on foam WBOS 3 x 15-20" EC on firm NBOS/shoulder width 3 x 10-20"  PATIENT EDUCATION: Education details: reviewed vestibular findings + Continue HEP + updates Person educated: Patient and Spouse Education method: Explanation, Demonstration, Verbal cues, and Handouts Education comprehension: verbalized understanding and needs further education  HOME EXERCISE PROGRAM: Access Code: [email or web address] URL: [email or web address] Date: 06/03/2023 Prepared by: Sherlie Ban  Exercises - Romberg Stance with Head Rotation  - 1-2 x daily - 5 x weekly - 2 sets - 10 reps - Wide Stance with Eyes Closed on Foam Pad  - 1-2 x daily - 5 x weekly - 3 sets - 30 hold - Tandem Stance  - 1-2 x daily - 5 x weekly - 3 sets - 15-20 hold  Access Code: [email or web address] URL: [email or web address] Date: 06/10/2023 Prepared by: Maryruth Eve  Exercises - Seated Gaze Stabilization with Head Rotation  - 1 x daily - 7 x weekly - 3 sets - 30 seconds hold - Seated Gaze Stabilization with Head Nod  - 1 x daily - 7 x weekly - 3 sets - 30 seconds hold  GOALS: Goals reviewed with patient? Yes  SHORT TERM GOALS: Target date: 06/26/2023   Pt will be independent with initial HEP for improved strength, balance, transfers and  gait. Baseline: Goal status: INITIAL  2.  Pt will improve 5 x STS to less than or equal to 30 seconds to demonstrate improved functional strength and transfer efficiency.  Baseline: 33.47 sec (6/12) Goal status: INITIAL  3.  Pt will improve FGA to 15/30 for decreased fall risk  Baseline: 12/30 (6/12) Goal status: INITIAL  4.  Pt will improve Condition 4 of the mCTSIB to >/= 10 sec to demonstrate improved balance and decreased fall risk. Baseline: 4 sec (6/12) Goal status: INITIAL  5.  Vestibular system to be addressed and STG set Baseline:  Goal status: INITIAL   LONG TERM GOALS: Target date: 07/24/2023    Pt will be independent with final HEP for improved strength, balance, transfers and gait. Baseline:  Goal status: INITIAL  2.  Pt will improve 5 x STS to less than or equal to 25 seconds to demonstrate improved functional strength and transfer efficiency.  Baseline: 33.47 sec (6/15) Goal status: INITIAL  3.  Pt will improve FGA to 18/30 for decreased fall risk  Baseline: 12/30 (6/12) Goal status: INITIAL  4.  Pt will improve Condition 4 of the mCTSIB to >/= 15 sec to demonstrate improved balance and decreased fall risk. Baseline: 4 sec (6/12) Goal status: INITIAL  5.  Vestibular system to be addressed and LTG set Baseline:  Goal status: INITIAL   ASSESSMENT:  CLINICAL IMPRESSION: Today's skilled session focused on continued work on adaptation and habituation exercises with emphasis on head turns and balance. Patient only reports dizziness with head turns with HART chart reading.  Requires intermittent minA with corner balance with poor reactive balance strategies. Will plan to integrate more in future sessions. Will continue per POC.   OBJECTIVE IMPAIRMENTS: Abnormal gait, decreased balance, decreased knowledge of condition, difficulty walking, decreased strength, dizziness, and impaired perceived functional ability.   ACTIVITY LIMITATIONS: carrying, lifting,  bending, squatting, stairs, and transfers  PARTICIPATION LIMITATIONS: driving, community activity, and yard work  PERSONAL FACTORS: Age, Time since onset of injury/illness/exacerbation, and 1-2 comorbidities:    Seizure disorder, Headachesare also affecting patient's functional outcome.   REHAB POTENTIAL: Good  CLINICAL DECISION MAKING: Stable/uncomplicated  EVALUATION COMPLEXITY: Low  PLAN:  PT FREQUENCY: 1x/week  PT DURATION: 8 weeks  PLANNED INTERVENTIONS: Therapeutic exercises, Therapeutic activity, Neuromuscular re-education, Balance training, Gait training, Patient/Family education, Self Care, Joint mobilization, Vestibular training, Canalith repositioning, Visual/preceptual remediation/compensation, and Re-evaluation  PLAN FOR NEXT SESSION: add to HEP to address vestibular system impairments as well as for balance, VOR exercises, continue balance, esp unlevel surfaces, vestibular input , reactive balance strategies  Carmelia Bake, PT, DPT 06/10/2023, 6:10 PM

## 2023-06-16 ENCOUNTER — Ambulatory Visit: Payer: Managed Care, Other (non HMO) | Admitting: Physical Therapy

## 2023-06-24 ENCOUNTER — Encounter: Payer: Self-pay | Admitting: Physical Therapy

## 2023-06-24 ENCOUNTER — Ambulatory Visit: Payer: Managed Care, Other (non HMO) | Attending: Neurology | Admitting: Physical Therapy

## 2023-06-24 VITALS — BP 140/77 | HR 83

## 2023-06-24 DIAGNOSIS — M6281 Muscle weakness (generalized): Secondary | ICD-10-CM | POA: Insufficient documentation

## 2023-06-24 DIAGNOSIS — R2689 Other abnormalities of gait and mobility: Secondary | ICD-10-CM | POA: Diagnosis present

## 2023-06-24 DIAGNOSIS — R42 Dizziness and giddiness: Secondary | ICD-10-CM | POA: Diagnosis present

## 2023-06-24 DIAGNOSIS — R2681 Unsteadiness on feet: Secondary | ICD-10-CM | POA: Insufficient documentation

## 2023-06-24 NOTE — Therapy (Signed)
OUTPATIENT PHYSICAL THERAPY NEURO TREATMENT   Patient Name: Evelyn [name] MRN: [id number] DOB:November 12, 1946, 77 y.o., female Today's Date: 06/25/2023   PCP: Chilton Greathouse, MD REFERRING PROVIDER: Windell Norfolk, MD  END OF SESSION:  PT End of Session - 06/24/23 1617     Visit Number 4    Number of Visits 9    Date for PT Re-Evaluation 08/18/23    Authorization Type [employer]    PT Start Time 1617    PT Stop Time 1704    PT Time Calculation (min) 47 min    Equipment Utilized During Treatment Gait belt    Activity Tolerance Patient tolerated treatment well    Behavior During Therapy WFL for tasks assessed/performed             Past Medical History:  Diagnosis Date   DVT (deep venous thrombosis) (HCC) 2005   on coumadin; recurrent 2005, 2010; 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, but unable to be removed, being treated by Dr Isaiah Serge at [facility])    History of echocardiogram 03/11/09   nl EF 60-65%   History of recurrent UTIs    feels like may have one now   PE (pulmonary embolism) 2005   Peripheral vascular disease (HCC)    dvt leg rt   Seizures (HCC)    Past Surgical History:  Procedure Laterality Date   arm surgery Right    INSERTION OF VENA CAVA FILTER  10   KNEE SURGERY Left 96   kneecap fx   ORIF HUMERUS FRACTURE Right 08/01/2013   Procedure: NON-UNION REPAIR RIGHT PROXIMAL HUMERUS FRACTURE;  Surgeon: Budd Palmer, MD;  Location: [facility] OR;  Service: Orthopedics;  Laterality: Right;   Patient Active Problem List   Diagnosis Date Noted   Atrial fibrillation (HCC) 09/27/2020   Acute venous embolism and thrombosis of deep vessels of proximal lower extremity (HCC) 09/27/2020   Encounter for screening colonoscopy 07/13/2014   Chronic anticoagulation 07/13/2014   Acute pulmonary embolism (HCC) 05/10/2013   DVT (deep venous thrombosis), unspecified laterality 03/01/2013   DVT (deep venous thrombosis) (HCC)     ONSET DATE: 05/05/2023 (referral  date)  REFERRING DIAG: R26.9 (ICD-10-CM) - Gait abnormality R26.89 (ICD-10-CM) - Balance problem  THERAPY DIAG:  Muscle weakness (generalized)  Other abnormalities of gait and mobility  Unsteadiness on feet  Dizziness and giddiness  Rationale for Evaluation and Treatment: Rehabilitation  SUBJECTIVE:  SUBJECTIVE STATEMENT: Pt goes by "[name]"  Patient reports that she is feeling more unsteady today. She denies falls/near falls. Patient is open to working on anything.   Pt accompanied by: self and significant other husband Don  PERTINENT HISTORY:  Per Dr. Teresa Coombs note 03/16/23 This is a 77 year old woman past medical history of seizure disorder, well controlled on lamotrigine, headaches, insomnia, who is presenting with balance problem for the past 2 years. Patient reports for the past 2 years she had balance problem described as unsteadiness, difficulty with walking. She denies any room spinning sensation. She report during this time she had 2 falls, the first 1 was on October 11, 2021. She was walking and suddenly fell forward like someone pushed her from her back. The last fall was on July 10. She reports she was walking and felt like she could not pick up her foot and she fell to the ground. Denies any major hand injuries with these falls. She has follow-up with a neurologist in [place], had extensive work-up including multiple MRIs and CT scans (showing ventriculomegaly ?NPH), VNG and they were all within normal limits.  PAIN:  Are you having pain? No  PRECAUTIONS: Fall  WEIGHT BEARING RESTRICTIONS: No  FALLS: Has patient fallen in last 6 months? No; but did have a fall in the last year and taken to ED by EMS but they did a check up and she was ok  LIVING ENVIRONMENT: Lives with: lives with  their family Lives in: House/apartment Stairs: Yes: External: 4 steps; on right going up Has following equipment at home:  standard walker ; uses it when she gets out of bed first thing in the morning when she isn't sure how her balance is going to be that day  PLOF: Independent with gait, Independent with transfers, and Requires assistive device for independence  PATIENT GOALS: "to get over this balance"  OBJECTIVE:   VITALS: Vitals:   06/24/23 1622  BP: (!) 140/77  Pulse: 83    DIAGNOSTIC FINDINGS: None relevant to this POC.  COGNITION: Overall cognitive status: Within functional limits for tasks assessed   SENSATION: Light touch appears intact Per pt report she could not feel tuning fork in foot from her ankle down when Dr. Teresa Coombs was testing her (large fiber neuropathy)  COORDINATION: WFL  EDEMA:  Mild LE edema: LLE>RLE   LOWER EXTREMITY MMT:    MMT Right Eval Left Eval  Hip flexion 4 4  Hip extension    Hip abduction    Hip adduction    Hip internal rotation    Hip external rotation    Knee flexion 4 4  Knee extension 5 5  Ankle dorsiflexion 5 5  Ankle plantarflexion    Ankle inversion    Ankle eversion    (Blank rows = not tested)  BED MOBILITY:  Independent per pt report  TRANSFERS: Assistive device utilized: None  Sit to stand: SBA Stand to sit: SBA Chair to chair: SBA Floor:  not assessed at eval   STAIRS: Level of Assistance: SBA Stair Negotiation Technique: Alternating Pattern  with Bilateral Rails Number of Stairs: 4  Height of Stairs: 6  Comments: WFL  GAIT: Distance walked: various clinic distances Assistive device utilized: None Level of assistance: CGA Comments: some path deviation, decreased gait speed, occasional stumbling    TODAY'S TREATMENT:  NMR:  Gaze Adaptation: x1 Viewing Horizontal:  Position: attempted standing , Time: 30 sec, Reps: 2 standing, and Comment: has eyes intermittently get thrown off to right, educated on pacing and ROM and x1 Viewing Vertical:  Position: seated, Time: 30 sec, Reps: 2, and Comment: eyes intermittently thrown of target, reports increased difficulty with vertical  Stepping Strategy (minA): Step to dots 1 x 10 (CGA) Step backward / return 1 x 10 (cueing for sequencing) Step forward / return 1 x 10 (cueing for sequencing) Step laterally / return 1 x 10 (cueing for sequencing)  *Difficult time sequencing and remember cueing/stepping even with therapist demonstrating next steps  Forward gait: Walking forward without handhold assist (minA) Lateral LOB intermittently, slow steps, unsteady 2 x 30 feet Walking forward with small head turns (minA)  Lateral instability with slow head turns 2 x 30 feet (difficult time recalling to move head/sequencing)  PATIENT EDUCATION: Education details: examination findings + discussion of trialing AD next session for safety + continue HEP Person educated: Patient and Spouse Education method: Explanation, Demonstration, Verbal cues, and Handouts Education comprehension: verbalized understanding and needs further education  HOME EXERCISE PROGRAM: Access Code: [email or web address] URL: [email or web address] Date: 06/03/2023 Prepared by: Sherlie Ban  Exercises - Romberg Stance with Head Rotation  - 1-2 x daily - 5 x weekly - 2 sets - 10 reps - Wide Stance with Eyes Closed on Foam Pad  - 1-2 x daily - 5 x weekly - 3 sets - 30 hold - Tandem Stance  - 1-2 x daily - 5 x weekly - 3 sets - 15-20 hold  Access Code: [email or web address] URL: [email or web address] Date: 06/10/2023 Prepared by: Maryruth Eve  Exercises - Seated Gaze Stabilization with Head Rotation  - 1 x daily - 7 x weekly - 3 sets - 30 seconds hold - Seated Gaze Stabilization with Head Nod  - 1 x daily - 7 x weekly - 3 sets - 30 seconds  hold  GOALS: Goals reviewed with patient? Yes  SHORT TERM GOALS: Target date: 06/26/2023   Pt will be independent with initial HEP for improved strength, balance, transfers and gait. Baseline: Goal status: INITIAL  2.  Pt will improve 5 x STS to less than or equal to 30 seconds to demonstrate improved functional strength and transfer efficiency.  Baseline: 33.47 sec (6/12) Goal status: INITIAL  3.  Pt will improve FGA to 15/30 for decreased fall risk  Baseline: 12/30 (6/12) Goal status: INITIAL  4.  Pt will improve Condition 4 of the mCTSIB to >/= 10 sec to demonstrate improved balance and decreased fall risk. Baseline: 4 sec (6/12) Goal status: INITIAL  5.  Vestibular system to be addressed and STG set Baseline:  Goal status: INITIAL   LONG TERM GOALS: Target date: 07/24/2023    Pt will be independent with final HEP for improved strength, balance, transfers and gait. Baseline:  Goal status: INITIAL  2.  Pt will improve 5 x STS to less than or equal to 25 seconds to demonstrate improved functional strength and transfer efficiency.  Baseline: 33.47 sec (6/15) Goal status: INITIAL  3.  Pt will improve FGA to 18/30 for decreased fall risk  Baseline: 12/30 (6/12) Goal status: INITIAL  4.  Pt will improve Condition 4 of the mCTSIB to >/= 15 sec to demonstrate improved balance and decreased fall risk. Baseline: 4 sec (6/12) Goal status: INITIAL  5.  Vestibular system to be addressed and LTG set Baseline:  Goal status: INITIAL   ASSESSMENT:  CLINICAL IMPRESSION: Session continued to work on attempting to progress VOR x 1 viewing form seated to standing; however, patient reports increase in dizziness to 6/10 even with slower speed. Patient also demonstrates signficant difficulty with motor planning and sequencing and requires frequent visual and tactile cues to continue sequencing of stepping strategy. Given extent of balance deficits noted in today's session recommend  possible AD next session. Will continue per POC.   OBJECTIVE IMPAIRMENTS: Abnormal gait, decreased balance, decreased knowledge of condition, difficulty walking, decreased strength, dizziness, and impaired perceived functional ability.   ACTIVITY LIMITATIONS: carrying, lifting, bending, squatting, stairs, and transfers  PARTICIPATION LIMITATIONS: driving, community activity, and yard work  PERSONAL FACTORS: Age, Time since onset of injury/illness/exacerbation, and 1-2 comorbidities:    Seizure disorder, Headachesare also affecting patient's functional outcome.   REHAB POTENTIAL: Good  CLINICAL DECISION MAKING: Stable/uncomplicated  EVALUATION COMPLEXITY: Low  PLAN:  PT FREQUENCY: 1x/week  PT DURATION: 8 weeks  PLANNED INTERVENTIONS: Therapeutic exercises, Therapeutic activity, Neuromuscular re-education, Balance training, Gait training, Patient/Family education, Self Care, Joint mobilization, Vestibular training, Canalith repositioning, Visual/preceptual remediation/compensation, and Re-evaluation  PLAN FOR NEXT SESSION: add to HEP to address vestibular system impairments as well as for balance, VOR exercises, continue balance, esp unlevel surfaces, vestibular input , reactive balance strategies, work on balance strategies, possible AD trial; assess goals  Carmelia Bake, PT, DPT 06/25/2023, 7:52 AM

## 2023-06-25 ENCOUNTER — Ambulatory Visit: Payer: Managed Care, Other (non HMO) | Attending: Cardiovascular Disease

## 2023-06-25 ENCOUNTER — Encounter: Payer: Self-pay | Admitting: Physical Therapy

## 2023-06-25 DIAGNOSIS — Z7901 Long term (current) use of anticoagulants: Secondary | ICD-10-CM | POA: Diagnosis not present

## 2023-06-25 DIAGNOSIS — I824Y3 Acute embolism and thrombosis of unspecified deep veins of proximal lower extremity, bilateral: Secondary | ICD-10-CM

## 2023-06-25 LAB — POCT INR: INR: 2.4 (ref 2.0–3.0)

## 2023-06-25 NOTE — Patient Instructions (Signed)
continue taking warfarin 1 tablet daily. Recheck INR in 5 weeks. Continue with 3 serving of vitamin K foods per week. [facility] [phone]

## 2023-07-01 ENCOUNTER — Encounter: Payer: Self-pay | Admitting: Physical Therapy

## 2023-07-01 ENCOUNTER — Ambulatory Visit: Payer: Managed Care, Other (non HMO) | Admitting: Physical Therapy

## 2023-07-01 VITALS — BP 138/64 | HR 78

## 2023-07-01 DIAGNOSIS — R2681 Unsteadiness on feet: Secondary | ICD-10-CM

## 2023-07-01 DIAGNOSIS — M6281 Muscle weakness (generalized): Secondary | ICD-10-CM

## 2023-07-01 DIAGNOSIS — R42 Dizziness and giddiness: Secondary | ICD-10-CM

## 2023-07-01 DIAGNOSIS — R2689 Other abnormalities of gait and mobility: Secondary | ICD-10-CM

## 2023-07-01 NOTE — Therapy (Signed)
OUTPATIENT PHYSICAL THERAPY NEURO TREATMENT   Patient Name: FATINA SPRANKLE MRN: [id number] DOB:1946/03/30, 77 y.o., female Today's Date: 07/01/2023   PCP: Chilton Greathouse, MD REFERRING PROVIDER: Windell Norfolk, MD  END OF SESSION:  PT End of Session - 07/01/23 1536     Visit Number 5    Number of Visits 9    Date for PT Re-Evaluation 08/18/23    Authorization Type [employer]    PT Start Time 1536    PT Stop Time 1625    PT Time Calculation (min) 49 min    Equipment Utilized During Treatment Gait belt    Activity Tolerance Patient tolerated treatment well    Behavior During Therapy WFL for tasks assessed/performed             Past Medical History:  Diagnosis Date   DVT (deep venous thrombosis) (HCC) 2005   on coumadin; recurrent 2005, 2010; 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, but unable to be removed, being treated by Dr Isaiah Serge at [facility])    History of echocardiogram 03/11/09   nl EF 60-65%   History of recurrent UTIs    feels like may have one now   PE (pulmonary embolism) 2005   Peripheral vascular disease (HCC)    dvt leg rt   Seizures (HCC)    Past Surgical History:  Procedure Laterality Date   arm surgery Right    INSERTION OF VENA CAVA FILTER  10   KNEE SURGERY Left 96   kneecap fx   ORIF HUMERUS FRACTURE Right 08/01/2013   Procedure: NON-UNION REPAIR RIGHT PROXIMAL HUMERUS FRACTURE;  Surgeon: Budd Palmer, MD;  Location: [facility] OR;  Service: Orthopedics;  Laterality: Right;   Patient Active Problem List   Diagnosis Date Noted   Atrial fibrillation (HCC) 09/27/2020   Acute venous embolism and thrombosis of deep vessels of proximal lower extremity (HCC) 09/27/2020   Encounter for screening colonoscopy 07/13/2014   Chronic anticoagulation 07/13/2014   Acute pulmonary embolism (HCC) 05/10/2013   DVT (deep venous thrombosis), unspecified laterality 03/01/2013   DVT (deep venous thrombosis) (HCC)     ONSET DATE: 05/05/2023 (referral  date)  REFERRING DIAG: R26.9 (ICD-10-CM) - Gait abnormality R26.89 (ICD-10-CM) - Balance problem  THERAPY DIAG:  Muscle weakness (generalized)  Other abnormalities of gait and mobility  Unsteadiness on feet  Dizziness and giddiness  Rationale for Evaluation and Treatment: Rehabilitation  SUBJECTIVE:  SUBJECTIVE STATEMENT: Pt goes by "[name]"  Patient reports she is having an off day. She feels more unsteady and is having a difficult time picking feet up. Patient and husband report that two different neurologist don't think patient has hydrocephalus but suspecting large fiber polyneuropathy.   Pt accompanied by: self and significant other husband Don  PERTINENT HISTORY:  Per Dr. Teresa Coombs note 03/16/23 This is a 76 year old woman past medical history of seizure disorder, well controlled on lamotrigine, headaches, insomnia, who is presenting with balance problem for the past 2 years. Patient reports for the past 2 years she had balance problem described as unsteadiness, difficulty with walking. She denies any room spinning sensation. She report during this time she had 2 falls, the first 1 was on October 11, 2021. She was walking and suddenly fell forward like someone pushed her from her back. The last fall was on July 10. She reports she was walking and felt like she could not pick up her foot and she fell to the ground. Denies any major hand injuries with these falls. She has follow-up with a neurologist in [place], had extensive work-up including multiple MRIs and CT scans (showing ventriculomegaly ?NPH), VNG and they were all within normal limits.  PAIN:  Are you having pain? No  PRECAUTIONS: Fall  WEIGHT BEARING RESTRICTIONS: No  FALLS: Has patient fallen in last 6 months? No; but did have a fall in  the last year and taken to ED by EMS but they did a check up and she was ok  LIVING ENVIRONMENT: Lives with: lives with their family Lives in: House/apartment Stairs: Yes: External: 4 steps; on right going up Has following equipment at home:  standard walker ; uses it when she gets out of bed first thing in the morning when she isn't sure how her balance is going to be that day  PLOF: Independent with gait, Independent with transfers, and Requires assistive device for independence  PATIENT GOALS: "to get over this balance"  OBJECTIVE:   VITALS: Vitals:   07/01/23 1547  BP: 138/64  Pulse: 78     DIAGNOSTIC FINDINGS: None relevant to this POC.  COGNITION: Overall cognitive status: Within functional limits for tasks assessed   SENSATION: Light touch appears intact Per pt report she could not feel tuning fork in foot from her ankle down when Dr. Teresa Coombs was testing her (large fiber neuropathy)  COORDINATION: WFL  EDEMA:  Mild LE edema: LLE>RLE   LOWER EXTREMITY MMT:    MMT Right Eval Left Eval  Hip flexion 4 4  Hip extension    Hip abduction    Hip adduction    Hip internal rotation    Hip external rotation    Knee flexion 4 4  Knee extension 5 5  Ankle dorsiflexion 5 5  Ankle plantarflexion    Ankle inversion    Ankle eversion    (Blank rows = not tested)  BED MOBILITY:  Independent per pt report  TRANSFERS: Assistive device utilized: None  Sit to stand: SBA Stand to sit: SBA Chair to chair: SBA Floor:  not assessed at eval   STAIRS: Level of Assistance: SBA Stair Negotiation Technique: Alternating Pattern  with Bilateral Rails Number of Stairs: 4  Height of Stairs: 6  Comments: WFL  GAIT: Distance walked: various clinic distances Assistive device utilized: None Level of assistance: CGA Comments: some path deviation, decreased gait speed, occasional stumbling    TODAY'S TREATMENT:  VESTIBULAR ASSESSMENT: Retested as patient continues to have significant vestibular deficits.   OCULOMOTOR EXAM:  Ocular Alignment: normal  Ocular ROM: No Limitations  Spontaneous Nystagmus: absent  Gaze-Induced Nystagmus: absent  Smooth Pursuits: intact  Saccades: intact  Convergence/Divergence: < 5 cm   Cover / uncover test: Exotropia bialterally  VESTIBULAR - OCULAR REFLEX:  Slow VOR: Comment: negative bilaterally, some refixation with vertical but denies dizziness  VOR Cancellation: Normal  Head-Impulse Test: HIT Right: negative HIT Left: negative - negative in today's session  HIT improved from when last assessed in today's session; new finding of exotropia bilaterally with cover / uncover test  Gait Assessment with verus without AD:  GAIT: Gait pattern:  decreased turning of head, unsteady/hesitant, decreased arm swing- Right, decreased arm swing- Left, decreased step length- Right, decreased step length- Left, decreased stance time- Right, decreased stance time- Left, decreased hip/knee flexion- Right, decreased hip/knee flexion- Left, decreased ankle dorsiflexion- Right, decreased ankle dorsiflexion- Left, poor foot clearance- Right, and poor foot clearance- Left Distance walked: various clinic distances Assistive device utilized: requires hand hold assist from spouse Level of assistance: hand hold min A Comments: unsteady, hesitant, decreased pace, avoids turning head  GAIT: Gait pattern: step to pattern, decreased hip/knee flexion- Right, decreased hip/knee flexion- Left, decreased ankle dorsiflexion- Right, decreased ankle dorsiflexion- Left, poor foot clearance- Right, and poor foot clearance- Left Distance walked: 1 x 80 feet Assistive device utilized: SPC with quad tip Level of assistance: CGA Comments: mix of 2 point / 3 point, difficult time coordinating, poor lateral stability on side not of  cane  GAIT: Gait pattern:  step through pattern, decreased hip/knee flexion- Right, decreased hip/knee flexion- Left, decreased ankle dorsiflexion- Right, decreased ankle dorsiflexion- Left, poor foot clearance- Right, and poor foot clearance- Left Distance walked: 1 x 230 feet Assistive device utilized: Walker - 4 wheeled Level of assistance: SBA Comments: Improved stability noted and stride length, able to turn head side to side without major signs of instability  TherAct: Discussed extensively different AD. Based on today's findings, therapist would recommend rollator likely; however, will require various trials up and down ramps/outdoors and education on use of breaks to determine appropriateness. Discussed benefits of safety of having an AD.  PATIENT EDUCATION: Education details: Continue HEP + education on AD as noted above Person educated: Patient and Spouse Education method: Explanation, Facilities manager, and Verbal cues Education comprehension: verbalized understanding and needs further education  HOME EXERCISE PROGRAM: Access Code: [email or web address] URL: [email or web address] Date: 06/03/2023 Prepared by: Sherlie Ban  Exercises - Romberg Stance with Head Rotation  - 1-2 x daily - 5 x weekly - 2 sets - 10 reps - Wide Stance with Eyes Closed on Foam Pad  - 1-2 x daily - 5 x weekly - 3 sets - 30 hold - Tandem Stance  - 1-2 x daily - 5 x weekly - 3 sets - 15-20 hold  Access Code: [email or web address] URL: [email or web address] Date: 06/10/2023 Prepared by: Maryruth Eve  Exercises - Seated Gaze Stabilization with Head Rotation  - 1 x daily - 7 x weekly - 3 sets - 30 seconds hold - Seated Gaze Stabilization with Head Nod  - 1 x daily - 7 x weekly - 3 sets - 30 seconds hold  GOALS: Goals reviewed with patient? Yes  SHORT TERM GOALS: Target date: 06/26/2023   Pt will be independent with initial HEP for improved strength, balance, transfers and  gait. Baseline: Goal status: INITIAL  2.  Pt will improve 5 x STS  to less than or equal to 30 seconds to demonstrate improved functional strength and transfer efficiency.  Baseline: 33.47 sec (6/12) Goal status: INITIAL  3.  Pt will improve FGA to 15/30 for decreased fall risk  Baseline: 12/30 (6/12) Goal status: INITIAL  4.  Pt will improve Condition 4 of the mCTSIB to >/= 10 sec to demonstrate improved balance and decreased fall risk. Baseline: 4 sec (6/12) Goal status: INITIAL  5.  Vestibular system to be addressed and STG set Baseline:  Goal status: INITIAL   LONG TERM GOALS: Target date: 07/24/2023    Pt will be independent with final HEP for improved strength, balance, transfers and gait. Baseline:  Goal status: INITIAL  2.  Pt will improve 5 x STS to less than or equal to 25 seconds to demonstrate improved functional strength and transfer efficiency.  Baseline: 33.47 sec (6/15) Goal status: INITIAL  3.  Pt will improve FGA to 18/30 for decreased fall risk  Baseline: 12/30 (6/12) Goal status: INITIAL  4.  Pt will improve Condition 4 of the mCTSIB to >/= 15 sec to demonstrate improved balance and decreased fall risk. Baseline: 4 sec (6/12) Goal status: INITIAL  5.  Vestibular system to be addressed and LTG set Baseline:  Goal status: INITIAL   ASSESSMENT:  CLINICAL IMPRESSION: Session emphasized brief review of vestibular screen as patient still has complex vestibular response. HIT appeared improved to when last assessed. Only new finding of exotropia and patient reports no known history of eye weakness. Remainder of session spent assessing need for AD. Patient demonstrates significant need for use of AD and rollator may be appropriate based on today's findings; however, recommend further trials. Discussed extensively with patient/spouse. Patient reports fear of AD making her more dependent; discussed in length benefits for safety and improving safety/activity.  Will continue per POC.   OBJECTIVE IMPAIRMENTS: Abnormal gait, decreased balance, decreased knowledge of condition, difficulty walking, decreased strength, dizziness, and impaired perceived functional ability.   ACTIVITY LIMITATIONS: carrying, lifting, bending, squatting, stairs, and transfers  PARTICIPATION LIMITATIONS: driving, community activity, and yard work  PERSONAL FACTORS: Age, Time since onset of injury/illness/exacerbation, and 1-2 comorbidities:    Seizure disorder, Headachesare also affecting patient's functional outcome.   REHAB POTENTIAL: Good  CLINICAL DECISION MAKING: Stable/uncomplicated  EVALUATION COMPLEXITY: Low  PLAN:  PT FREQUENCY: 1x/week  PT DURATION: 8 weeks  PLANNED INTERVENTIONS: Therapeutic exercises, Therapeutic activity, Neuromuscular re-education, Balance training, Gait training, Patient/Family education, Self Care, Joint mobilization, Vestibular training, Canalith repositioning, Visual/preceptual remediation/compensation, and Re-evaluation  PLAN FOR NEXT SESSION: add to HEP to address vestibular system impairments as well as for balance, VOR exercises, continue balance, esp unlevel surfaces, vestibular input , reactive balance strategies, work on balance strategies, rollator trial in various terrain and break education, ASSESS STGs  Carmelia Bake, PT, DPT 07/01/2023, 5:08 PM

## 2023-07-06 ENCOUNTER — Other Ambulatory Visit: Payer: Self-pay | Admitting: Cardiovascular Disease

## 2023-07-06 DIAGNOSIS — I4891 Unspecified atrial fibrillation: Secondary | ICD-10-CM

## 2023-07-08 ENCOUNTER — Other Ambulatory Visit: Payer: Self-pay | Admitting: Neurology

## 2023-07-08 ENCOUNTER — Ambulatory Visit: Payer: Managed Care, Other (non HMO) | Admitting: Physical Therapy

## 2023-07-08 ENCOUNTER — Encounter: Payer: Self-pay | Admitting: Physical Therapy

## 2023-07-08 VITALS — BP 125/83 | HR 89

## 2023-07-08 DIAGNOSIS — M6281 Muscle weakness (generalized): Secondary | ICD-10-CM | POA: Diagnosis not present

## 2023-07-08 DIAGNOSIS — R2681 Unsteadiness on feet: Secondary | ICD-10-CM

## 2023-07-08 DIAGNOSIS — R42 Dizziness and giddiness: Secondary | ICD-10-CM

## 2023-07-08 DIAGNOSIS — R2689 Other abnormalities of gait and mobility: Secondary | ICD-10-CM

## 2023-07-08 NOTE — Therapy (Signed)
OUTPATIENT PHYSICAL THERAPY NEURO TREATMENT   Patient Name: Evelyn [name] MRN: [id number] DOB:27-Apr-1946, 77 y.o., female Today's Date: 07/08/2023   PCP: Chilton Greathouse, MD REFERRING PROVIDER: Windell Norfolk, MD  END OF SESSION:  PT End of Session - 07/08/23 1535     Visit Number 6    Number of Visits 9    Date for PT Re-Evaluation 08/18/23    Authorization Type [employer]    PT Start Time 1535    PT Stop Time 1619    PT Time Calculation (min) 44 min    Equipment Utilized During Treatment Gait belt    Activity Tolerance Patient tolerated treatment well    Behavior During Therapy WFL for tasks assessed/performed;Anxious             Past Medical History:  Diagnosis Date   DVT (deep venous thrombosis) (HCC) 2005   on coumadin; recurrent 2005, 2010; 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, but unable to be removed, being treated by Dr Isaiah Serge at [facility])    History of echocardiogram 03/11/09   nl EF 60-65%   History of recurrent UTIs    feels like may have one now   PE (pulmonary embolism) 2005   Peripheral vascular disease (HCC)    dvt leg rt   Seizures (HCC)    Past Surgical History:  Procedure Laterality Date   arm surgery Right    INSERTION OF VENA CAVA FILTER  10   KNEE SURGERY Left 96   kneecap fx   ORIF HUMERUS FRACTURE Right 08/01/2013   Procedure: NON-UNION REPAIR RIGHT PROXIMAL HUMERUS FRACTURE;  Surgeon: Budd Palmer, MD;  Location: [facility] OR;  Service: Orthopedics;  Laterality: Right;   Patient Active Problem List   Diagnosis Date Noted   Atrial fibrillation (HCC) 09/27/2020   Acute venous embolism and thrombosis of deep vessels of proximal lower extremity (HCC) 09/27/2020   Encounter for screening colonoscopy 07/13/2014   Chronic anticoagulation 07/13/2014   Acute pulmonary embolism (HCC) 05/10/2013   DVT (deep venous thrombosis), unspecified laterality 03/01/2013   DVT (deep venous thrombosis) (HCC)     ONSET DATE: 05/05/2023  (referral date)  REFERRING DIAG: R26.9 (ICD-10-CM) - Gait abnormality R26.89 (ICD-10-CM) - Balance problem  THERAPY DIAG:  Muscle weakness (generalized)  Other abnormalities of gait and mobility  Unsteadiness on feet  Dizziness and giddiness  Rationale for Evaluation and Treatment: Rehabilitation  SUBJECTIVE:  SUBJECTIVE STATEMENT: Pt goes by "Evelyn [name]"  Patient reports one near fall on Saturday but patient's husband was able to help prevent her from falling backwards. Denies hitting her head but reports low back has been quite sore since then. Patient did get rollator and is going well using so far as needed. Patient reports being able to tolerate lying supine.   Pt accompanied by: self and significant other husband Don  PERTINENT HISTORY:  Per Dr. Teresa Coombs note 03/16/23 This is a 77 year old woman past medical history of seizure disorder, well controlled on lamotrigine, headaches, insomnia, who is presenting with balance problem for the past 2 years. Patient reports for the past 2 years she had balance problem described as unsteadiness, difficulty with walking. She denies any room spinning sensation. She report during this time she had 2 falls, the first 1 was on October 11, 2021. She was walking and suddenly fell forward like someone pushed her from her back. The last fall was on July 10. She reports she was walking and felt like she could not pick up her foot and she fell to the ground. Denies any major hand injuries with these falls. She has follow-up with a neurologist in [place], had extensive work-up including multiple MRIs and CT scans (showing ventriculomegaly ?NPH), VNG and they were all within normal limits.  PAIN:  Are you having pain? Yes: NPRS scale: 7-8/10 Pain location: in the middle Pain  description: sore Aggravating factors: unsure Relieving factors: tylenol  PRECAUTIONS: Fall  WEIGHT BEARING RESTRICTIONS: No  FALLS: Has patient fallen in last 6 months? No; but did have a fall in the last year and taken to ED by EMS but they did a check up and she was ok  LIVING ENVIRONMENT: Lives with: lives with their family Lives in: House/apartment Stairs: Yes: External: 4 steps; on right going up Has following equipment at home:  standard walker ; uses it when she gets out of bed first thing in the morning when she isn't sure how her balance is going to be that day  PLOF: Independent with gait, Independent with transfers, and Requires assistive device for independence  PATIENT GOALS: "to get over this balance"  OBJECTIVE:   VITALS: Vitals:   07/08/23 1541  BP: 125/83  Pulse: 89    DIAGNOSTIC FINDINGS: None relevant to this POC.  COGNITION: Overall cognitive status: Within functional limits for tasks assessed   SENSATION: Light touch appears intact Per pt report she could not feel tuning fork in foot from her ankle down when Dr. Teresa Coombs was testing her (large fiber neuropathy)  COORDINATION: WFL  EDEMA:  Mild LE edema: LLE>RLE   LOWER EXTREMITY MMT:    MMT Right Eval Left Eval  Hip flexion 4 4  Hip extension    Hip abduction    Hip adduction    Hip internal rotation    Hip external rotation    Knee flexion 4 4  Knee extension 5 5  Ankle dorsiflexion 5 5  Ankle plantarflexion    Ankle inversion    Ankle eversion    (Blank rows = not tested)  BED MOBILITY:  Independent per pt report  TRANSFERS: Assistive device utilized: None  Sit to stand: SBA Stand to sit: SBA Chair to chair: SBA Floor:  not assessed at eval   STAIRS: Level of Assistance: SBA Stair Negotiation Technique: Alternating Pattern  with Bilateral Rails Number of Stairs: 4  Height of Stairs: 6  Comments: WFL  GAIT: Distance walked: various clinic  distances Assistive  device utilized: None Level of assistance: CGA Comments: some path deviation, decreased gait speed, occasional stumbling  TODAY'S TREATMENT:                                                                                                                               TherAct:  Briefly reviewed rollator safety including rollator should not be used like wheelchair, use of breaks, how to sit in rollator, how to navigate ramps, performing transfers with correct use of breaks. Patient and spouse verbalized understanding.   Between // bars (CGA-minA): Stepping off and on rocker board with UE support in preparation for stepping reaction 2 x 5 forward, 2 x 5 backward Stepping reaction without UE support off rocker board with increased pertubation with repition   Reactive Balance Posterior x 5 Attempts: Set 1-3: > 4 steps to recover balance min A from therapist Set 4-5: 2 steps to recover balance with CGA from therapist  PATIENT EDUCATION: Education details: Continue HEP + education on rollator + balance reactions Person educated: Patient and Spouse Education method: Explanation, Demonstration, and Verbal cues Education comprehension: verbalized understanding and needs further education  HOME EXERCISE PROGRAM: Access Code: [email or web address] URL: [email or web address] Date: 06/03/2023 Prepared by: Sherlie Ban  Exercises - Romberg Stance with Head Rotation  - 1-2 x daily - 5 x weekly - 2 sets - 10 reps - Wide Stance with Eyes Closed on Foam Pad  - 1-2 x daily - 5 x weekly - 3 sets - 30 hold - Tandem Stance  - 1-2 x daily - 5 x weekly - 3 sets - 15-20 hold  Access Code: [email or web address] URL: [email or web address] Date: 06/10/2023 Prepared by: Maryruth Eve  Exercises - Seated Gaze Stabilization with Head Rotation  - 1 x daily - 7 x weekly - 3 sets - 30 seconds hold - Seated Gaze Stabilization with Head Nod  - 1 x daily - 7 x weekly - 3 sets - 30 seconds hold  GOALS: Goals  reviewed with patient? Yes  SHORT TERM GOALS: Target date: 06/26/2023   Pt will be independent with initial HEP for improved strength, balance, transfers and gait. Baseline: Goal status: INITIAL  2.  Pt will improve 5 x STS to less than or equal to 30 seconds to demonstrate improved functional strength and transfer efficiency.  Baseline: 33.47 sec (6/12) Goal status: INITIAL  3.  Pt will improve FGA to 15/30 for decreased fall risk  Baseline: 12/30 (6/12) Goal status: INITIAL  4.  Pt will improve Condition 4 of the mCTSIB to >/= 10 sec to demonstrate improved balance and decreased fall risk. Baseline: 4 sec (6/12) Goal status: INITIAL  5.  Vestibular system to be addressed and STG set Baseline:  Goal status: INITIAL   LONG TERM GOALS: Target date: 07/24/2023    Pt will be independent with final HEP for improved strength, balance, transfers and gait. Baseline:  Goal status: INITIAL  2.  Pt  will improve 5 x STS to less than or equal to 25 seconds to demonstrate improved functional strength and transfer efficiency.  Baseline: 33.47 sec (6/15) Goal status: INITIAL  3.  Pt will improve FGA to 18/30 for decreased fall risk  Baseline: 12/30 (6/12) Goal status: INITIAL  4.  Pt will improve Condition 4 of the mCTSIB to >/= 15 sec to demonstrate improved balance and decreased fall risk. Baseline: 4 sec (6/12) Goal status: INITIAL  5.  Vestibular system to be addressed and LTG set Baseline:  Goal status: INITIAL   ASSESSMENT:  CLINICAL IMPRESSION: Session emphasized  work on stepping strategy and reactive balance following near fall patient had over the weekend. Patient very anxious initially but improved with repetition and encouragement. Patient demonstrates improved stepping strategy by end of session but will benefit from reinforcement for maximal carryover. Patient also has now purchased rollator and therapist provided brief education on safety of use of breaks, etc as  noted above. Will continue per POC.   OBJECTIVE IMPAIRMENTS: Abnormal gait, decreased balance, decreased knowledge of condition, difficulty walking, decreased strength, dizziness, and impaired perceived functional ability.   ACTIVITY LIMITATIONS: carrying, lifting, bending, squatting, stairs, and transfers  PARTICIPATION LIMITATIONS: driving, community activity, and yard work  PERSONAL FACTORS: Age, Time since onset of injury/illness/exacerbation, and 1-2 comorbidities:    Seizure disorder, Headachesare also affecting patient's functional outcome.   REHAB POTENTIAL: Good  CLINICAL DECISION MAKING: Stable/uncomplicated  EVALUATION COMPLEXITY: Low  PLAN:  PT FREQUENCY: 1x/week  PT DURATION: 8 weeks  PLANNED INTERVENTIONS: Therapeutic exercises, Therapeutic activity, Neuromuscular re-education, Balance training, Gait training, Patient/Family education, Self Care, Joint mobilization, Vestibular training, Canalith repositioning, Visual/preceptual remediation/compensation, and Re-evaluation  PLAN FOR NEXT SESSION: add to HEP to address vestibular system impairments as well as for balance, VOR exercises, continue balance, esp unlevel surfaces, vestibular input , reactive balance strategies, work on balance strategies particularly stepping/reactive, large amplitude stepping HEP, ASSESS STGs  Carmelia Bake, PT, DPT 07/08/2023, 4:35 PM

## 2023-07-09 MED ORDER — LAMOTRIGINE 200 MG PO TABS: 200.0000 mg | ORAL_TABLET | Freq: Two times a day (BID) | ORAL | 0 refills | Status: DC

## 2023-07-09 MED ORDER — LAMOTRIGINE 100 MG PO TABS
50.0000 mg | ORAL_TABLET | Freq: Two times a day (BID) | ORAL | 0 refills | Status: DC
Start: 2023-07-09 — End: 2023-10-12

## 2023-07-15 ENCOUNTER — Ambulatory Visit: Payer: Managed Care, Other (non HMO) | Admitting: Physical Therapy

## 2023-07-22 ENCOUNTER — Ambulatory Visit: Payer: Managed Care, Other (non HMO) | Admitting: Physical Therapy

## 2023-07-29 ENCOUNTER — Ambulatory Visit: Payer: Managed Care, Other (non HMO) | Admitting: Physical Therapy

## 2023-07-30 ENCOUNTER — Ambulatory Visit: Payer: Managed Care, Other (non HMO) | Attending: Cardiovascular Disease | Admitting: *Deleted

## 2023-07-30 DIAGNOSIS — I824Y9 Acute embolism and thrombosis of unspecified deep veins of unspecified proximal lower extremity: Secondary | ICD-10-CM | POA: Diagnosis not present

## 2023-07-30 DIAGNOSIS — I824Y3 Acute embolism and thrombosis of unspecified deep veins of proximal lower extremity, bilateral: Secondary | ICD-10-CM | POA: Diagnosis not present

## 2023-07-30 DIAGNOSIS — Z7901 Long term (current) use of anticoagulants: Secondary | ICD-10-CM

## 2023-07-30 DIAGNOSIS — I4891 Unspecified atrial fibrillation: Secondary | ICD-10-CM

## 2023-07-30 LAB — POCT INR: INR: 4.2 — AB (ref 2.0–3.0)

## 2023-07-30 NOTE — Patient Instructions (Signed)
Description   Do not take any warfarin today and no warfarin tomorrow then continue taking warfarin 1 tablet daily. Recheck INR in 2 weeks (normally 5-6 weeks). Continue with 3 serving of vitamin K foods per week. [facility] [phone]

## 2023-08-05 ENCOUNTER — Ambulatory Visit: Payer: Managed Care, Other (non HMO) | Admitting: Physical Therapy

## 2023-08-12 ENCOUNTER — Ambulatory Visit: Payer: Managed Care, Other (non HMO) | Attending: Cardiology

## 2023-08-12 DIAGNOSIS — I824Y3 Acute embolism and thrombosis of unspecified deep veins of proximal lower extremity, bilateral: Secondary | ICD-10-CM | POA: Diagnosis not present

## 2023-08-12 DIAGNOSIS — Z7901 Long term (current) use of anticoagulants: Secondary | ICD-10-CM | POA: Diagnosis not present

## 2023-08-12 LAB — POCT INR: INR: 2.5 (ref 2.0–3.0)

## 2023-08-12 NOTE — Patient Instructions (Addendum)
  Description   Continue taking warfarin 1 tablet daily.  Recheck INR in 4 weeks (normally 5-6 weeks).  Continue with 3 serving of vitamin K foods per week.  [facility] [phone]

## 2023-09-03 ENCOUNTER — Ambulatory Visit: Payer: Managed Care, Other (non HMO) | Admitting: *Deleted

## 2023-09-03 ENCOUNTER — Ambulatory Visit: Payer: Managed Care, Other (non HMO) | Attending: Nurse Practitioner | Admitting: Nurse Practitioner

## 2023-09-03 ENCOUNTER — Encounter: Payer: Self-pay | Admitting: Nurse Practitioner

## 2023-09-03 VITALS — BP 118/70 | HR 82 | Ht 65.0 in | Wt 158.8 lb

## 2023-09-03 DIAGNOSIS — R296 Repeated falls: Secondary | ICD-10-CM | POA: Diagnosis not present

## 2023-09-03 DIAGNOSIS — Z7901 Long term (current) use of anticoagulants: Secondary | ICD-10-CM | POA: Diagnosis not present

## 2023-09-03 DIAGNOSIS — Z86718 Personal history of other venous thrombosis and embolism: Secondary | ICD-10-CM | POA: Diagnosis not present

## 2023-09-03 DIAGNOSIS — Z86711 Personal history of pulmonary embolism: Secondary | ICD-10-CM

## 2023-09-03 DIAGNOSIS — I824Y3 Acute embolism and thrombosis of unspecified deep veins of proximal lower extremity, bilateral: Secondary | ICD-10-CM | POA: Diagnosis not present

## 2023-09-03 LAB — POCT INR: POC INR: 2.9

## 2023-09-03 NOTE — Patient Instructions (Signed)
Description   Continue taking warfarin 1 tablet daily. Recheck INR in 5 weeks. Continue with 3 serving of vitamin K foods per week. [facility] [phone]

## 2023-09-03 NOTE — Progress Notes (Signed)
Office Visit    Patient Name: Evelyn [name] Date of Encounter: 09/03/2023  Primary Care Provider:  Chilton Greathouse, MD Primary Cardiologist:  Nanetta Batty, MD  Chief Complaint    77 year old female with a history of PE/bilateral DVT s/p IVC filter on Coumadin, recurrent falls, recurrent UTIs, and seizures who presents for follow-up related to PE/DVT.   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, but unable to be removed, being treated by Dr Isaiah Serge at [facility])    History of echocardiogram 03/11/09   nl EF 60-65%   History of recurrent UTIs    feels like may have one now   PE (pulmonary embolism) 2005   Peripheral vascular disease (HCC)    dvt leg rt   Seizures (HCC)    Past Surgical History:  Procedure Laterality Date   arm surgery Right    INSERTION OF VENA CAVA FILTER  10   KNEE SURGERY Left 96   kneecap fx   ORIF HUMERUS FRACTURE Right 08/01/2013   Procedure: NON-UNION REPAIR RIGHT PROXIMAL HUMERUS FRACTURE;  Surgeon: Budd Palmer, MD;  Location: [facility] OR;  Service: Orthopedics;  Laterality: Right;    Allergies  Allergies  Allergen Reactions   Tegretol [Carbamazepine] Rash     Labs/Other Studies Reviewed    The following studies were reviewed today: None    Recent Labs: 09/25/2022: BUN 25; Creatinine, Ser 0.83; Potassium 4.7; Sodium 143  Recent Lipid Panel    Component Value Date/Time   CHOL (H) 03/09/2009 0416    245        ATP III CLASSIFICATION:  <200     mg/dL   Desirable  403-474  mg/dL   Borderline High  >=259    mg/dL   High          TRIG 47 03/09/2009 0416   HDL 99 03/09/2009 0416   CHOLHDL 2.5 03/09/2009 0416   VLDL 9 03/09/2009 0416   LDLCALC (H) 03/09/2009 0416    137        Total Cholesterol/HDL:CHD Risk Coronary Heart Disease Risk Table                     Men   Women  1/2  Average Risk   3.4   3.3  Average Risk       5.0   4.4  2 X Average Risk   9.6   7.1  3 X Average Risk  23.4   11.0        Use the calculated Patient Ratio above and the CHD Risk Table to determine the patient's CHD Risk.        ATP III CLASSIFICATION (LDL):  <100     mg/dL   Optimal  563-875  mg/dL   Near or Above                    Optimal  130-159  mg/dL   Borderline  643-329  mg/dL   High  >518     mg/dL   Very High    History of Present Illness    77 year old female with the above past medical history including PE/bilateral DVT s/p IVC filter on Coumadin, recurrent falls, recurrent UTIs, and seizures.   Previously followed with Dr. Herbie Baltimore. She has since followed with  Dr. Allyson Sabal. She has a history of recurrent falls.  She was evaluated in the ED in July 2023 in the setting of mechanical fall.  CT of the head/C-spine/ maxillofacial showed no acute intracranial hemorrhage, no acute facial fracture.  She was last seen in the office on 08/28/2022 and was stable from a cardiac standpoint.  She denies any recurrent falls.  She was evaluated by ENT, ophthalmology, neurology for ongoing balance issues and was pending a second opinion with a new neurologist.   She presents today for follow-up accompanied by her husband.  Since her last visit she has been stable from a cardiac standpoint.  She continues to have issues with balance.  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  She denies any palpitations, dizziness, presyncope, syncope, denies symptoms concerning for angina.  Other than her ongoing balance issues, she reports feeling well.    Home Medications    Current Outpatient Medications  Medication Sig Dispense Refill   acetaminophen (TYLENOL) 500 MG tablet Take 500 mg by mouth every 6 (six) hours as needed.     Calcium Citrate-Vitamin D (CALCIUM CITRATE + D PO) Take 1 tablet by mouth 2 (two) times  daily with a meal.     cetirizine (ZYRTEC) 10 MG tablet Take 10 mg by mouth daily.     Coenzyme Q10 50 MG CAPS Take 1 capsule by mouth every morning.     Cyanocobalamin (B-12) 2000 MCG TABS Take 1 tablet by mouth daily.     HYDROcodone-acetaminophen (NORCO/VICODIN) 5-325 MG tablet Take 1 tablet by mouth every 6 (six) hours as needed for moderate pain.     lamoTRIgine (LAMICTAL) 100 MG tablet Take 0.5 tablets (50 mg total) by mouth 2 (two) times daily. Takes with 200mg  tablets for a total dose of 250mg  twice a day 90 tablet 0   lamoTRIgine (LAMICTAL) 200 MG tablet Take 1 tablet (200 mg total) by mouth 2 (two) times daily. Takes with 50mg  tablets for a total dose of 250mg  twice a day 360 tablet 0   Multiple Vitamin (MULTIVITAMIN WITH MINERALS) TABS tablet Take 1 tablet by mouth 2 (two) times daily with a meal.     Omega-3 Fatty Acids (OMEGA 3 PO) Take 1 capsule by mouth every morning.     polyethylene glycol (MIRALAX) 17 g packet Take 17 g by mouth 2 (two) times daily. 14 each 0   warfarin (COUMADIN) 7.5 MG tablet TAKE 1 TABLET BY MOUTH DAILY AS DIRECTED BY COAGULATION CLINIC 100 tablet 0   ascorbic acid (VITAMIN C) 1000 MG tablet Take 1,000 mg by mouth daily.     Biotin 1 MG CAPS Take 1 mg by mouth daily.     mirtazapine (REMERON) 7.5 MG tablet Take 3.75-7.5 mg by mouth at bedtime.     No current facility-administered medications for this visit.     Review of Systems    She denies chest pain, palpitations, dyspnea, pnd, orthopnea, n, v, dizziness, syncope, edema, weight gain, or early satiety. All other systems reviewed and are otherwise negative except as noted above.   Physical Exam    VS:  BP 118/70 (BP Location: Left Arm, Patient Position: Sitting, Cuff Size: Normal)   Pulse 82   Ht 5\' 5"  (1.651 m)   Wt 158 lb 12.8 oz (72 kg)   SpO2 97%   BMI 26.43 kg/m   GEN: Well nourished, well developed, in  no acute distress. HEENT: normal. Neck: Supple, no JVD, carotid bruits, or  masses. Cardiac: RRR, no murmurs, rubs, or gallops. No clubbing, cyanosis, edema.  Radials/DP/PT 2+ and equal bilaterally.  Respiratory:  Respirations regular and unlabored, clear to auscultation bilaterally. GI: Soft, nontender, nondistended, BS + x 4. MS: no deformity or atrophy. Skin: warm and dry, no rash. Neuro:  Strength and sensation are intact. Psych: Normal affect.  Accessory Clinical Findings    ECG personally reviewed by me today - EKG Interpretation Date/Time:  Friday September 03 2023 15:39:17 EDT Ventricular Rate:  82 PR Interval:  150 QRS Duration:  74 QT Interval:  380 QTC Calculation: 443 R Axis:   -7  Text Interpretation: Normal sinus rhythm Normal ECG When compared with ECG of 01-Aug-2021 15:28, No significant change was found Confirmed by Bernadene Person ([id number]) on 09/03/2023 3:50:09 PM  - no acute changes.   Lab Results  Component Value Date   WBC 7.2 06/22/2022   HGB 11.3 (L) 06/22/2022   HCT 35.6 (L) 06/22/2022   MCV 86.4 06/22/2022   PLT 274 06/22/2022   Lab Results  Component Value Date   CREATININE 0.83 09/25/2022   BUN 25 09/25/2022   NA 143 09/25/2022   K 4.7 09/25/2022   CL 102 09/25/2022   CO2 25 09/25/2022   Lab Results  Component Value Date   ALT 12 08/01/2021   AST 30 08/01/2021   ALKPHOS 100 08/01/2021   BILITOT 0.4 08/01/2021   Lab Results  Component Value Date   CHOL (H) 03/09/2009    245        ATP III CLASSIFICATION:  <200     mg/dL   Desirable  952-841  mg/dL   Borderline High  >=324    mg/dL   High          HDL 99 03/09/2009   LDLCALC (H) 03/09/2009    137        Total Cholesterol/HDL:CHD Risk Coronary Heart Disease Risk Table                     Men   Women  1/2 Average Risk   3.4   3.3  Average Risk       5.0   4.4  2 X Average Risk   9.6   7.1  3 X Average Risk  23.4   11.0        Use the calculated Patient Ratio above and the CHD Risk Table to determine the patient's CHD Risk.        ATP III CLASSIFICATION  (LDL):  <100     mg/dL   Optimal  401-027  mg/dL   Near or Above                    Optimal  130-159  mg/dL   Borderline  253-664  mg/dL   High  >403     mg/dL   Very High   TRIG 47 47/42/5956   CHOLHDL 2.5 03/09/2009    No results found for: "HGBA1C"  Assessment & Plan   1. H/o PE/DVT: S/p IVC filter.  Denies bleeding. Continue coumadin.    2. Recurrent Falls: She continues to have issues with balance and has had multiple falls.  She has been evaluated by several specialists including ENT, ophthalmology, and neurology.  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  She just finished working with PT.  She continues to note issues with balance.  Denies any recent falls.  Denies palpitations, dizziness, presyncope or syncope, denies symptoms concerning for angina.  Discussed fall ED precautions given chronic Coumadin.   3. Disposition: Follow-up in 1 year.       Joylene Grapes, NP 09/03/2023, 4:16 PM

## 2023-09-03 NOTE — Patient Instructions (Addendum)
Medication Instructions:  Your physician recommends that you continue on your current medications as directed. Please refer to the Current Medication list given to you today.  *If you need a refill on your cardiac medications before your next appointment, please call your pharmacy*   Lab Work: NONE ordered at this time of appointment   Testing/Procedures: NONE ordered at this time of appointment     Follow-Up: At [facility], you and your health needs are our priority.  As part of our continuing mission to provide you with exceptional heart care, we have created designated Provider Care Teams.  These Care Teams include your primary Cardiologist (physician) and Advanced Practice Providers (APPs -  Physician Assistants and Nurse Practitioners) who all work together to provide you with the care you need, when you need it.  We recommend signing up for the patient portal called "MyChart".  Sign up information is provided on this After Visit Summary.  MyChart is used to connect with patients for Virtual Visits (Telemedicine).  Patients are able to view lab/test results, encounter notes, upcoming appointments, etc.  Non-urgent messages can be sent to your provider as well.   To learn more about what you can do with MyChart, go to [email or web address].    Your next appointment:   1 year(s)  Provider:   Nanetta Batty, MD

## 2023-10-01 ENCOUNTER — Ambulatory Visit: Payer: Managed Care, Other (non HMO) | Attending: Cardiovascular Disease

## 2023-10-01 DIAGNOSIS — Z7901 Long term (current) use of anticoagulants: Secondary | ICD-10-CM | POA: Diagnosis not present

## 2023-10-01 DIAGNOSIS — I824Y3 Acute embolism and thrombosis of unspecified deep veins of proximal lower extremity, bilateral: Secondary | ICD-10-CM

## 2023-10-01 LAB — POCT INR: INR: 4.7 — AB (ref 2.0–3.0)

## 2023-10-01 NOTE — Patient Instructions (Signed)
HOLD TODAY and SATURDAY THEN Continue taking warfarin 1 tablet daily.  Recheck INR in 2 weeks.  Continue with 3 serving of vitamin K foods per week.  [facility] [phone]

## 2023-10-10 ENCOUNTER — Other Ambulatory Visit: Payer: Self-pay | Admitting: Neurology

## 2023-10-12 MED ORDER — LAMOTRIGINE 200 MG PO TABS: 200.0000 mg | ORAL_TABLET | Freq: Two times a day (BID) | ORAL | 0 refills | Status: DC

## 2023-10-14 ENCOUNTER — Other Ambulatory Visit: Payer: Self-pay | Admitting: Cardiovascular Disease

## 2023-10-14 DIAGNOSIS — I4891 Unspecified atrial fibrillation: Secondary | ICD-10-CM

## 2023-10-14 NOTE — Telephone Encounter (Signed)
Refill request for warfarin:  Last INR was 4.7 on 10/01/23 Next INR due 10/15/23 LOV was 09/03/23  Refill approved.

## 2023-10-15 ENCOUNTER — Ambulatory Visit: Payer: Managed Care, Other (non HMO) | Attending: Cardiovascular Disease

## 2023-10-15 DIAGNOSIS — I824Y3 Acute embolism and thrombosis of unspecified deep veins of proximal lower extremity, bilateral: Secondary | ICD-10-CM

## 2023-10-15 DIAGNOSIS — Z7901 Long term (current) use of anticoagulants: Secondary | ICD-10-CM | POA: Diagnosis not present

## 2023-10-15 LAB — POCT INR: INR: 2.6 (ref 2.0–3.0)

## 2023-10-15 NOTE — Patient Instructions (Signed)
Description   Continue taking warfarin 1 tablet daily.  Recheck INR in 3 weeks.  Continue with 3 serving of vitamin K foods per week.  [facility] [phone]

## 2023-11-05 ENCOUNTER — Ambulatory Visit: Payer: Managed Care, Other (non HMO) | Attending: Cardiovascular Disease | Admitting: *Deleted

## 2023-11-05 DIAGNOSIS — I824Y9 Acute embolism and thrombosis of unspecified deep veins of unspecified proximal lower extremity: Secondary | ICD-10-CM

## 2023-11-05 DIAGNOSIS — Z7901 Long term (current) use of anticoagulants: Secondary | ICD-10-CM | POA: Diagnosis not present

## 2023-11-05 DIAGNOSIS — I4891 Unspecified atrial fibrillation: Secondary | ICD-10-CM | POA: Diagnosis not present

## 2023-11-05 DIAGNOSIS — I824Y3 Acute embolism and thrombosis of unspecified deep veins of proximal lower extremity, bilateral: Secondary | ICD-10-CM | POA: Diagnosis not present

## 2023-11-05 LAB — POCT INR: INR: 1.9 — AB (ref 2.0–3.0)

## 2023-11-05 NOTE — Patient Instructions (Signed)
Description   Today take 1.5 tablets of warfarin then continue taking warfarin 1 tablet daily.  Recheck INR in 3 weeks.  Continue with 3 serving of vitamin K foods per week.  [facility] [phone]

## 2023-11-30 ENCOUNTER — Ambulatory Visit: Payer: Managed Care, Other (non HMO) | Attending: Cardiology | Admitting: *Deleted

## 2023-11-30 DIAGNOSIS — I824Y3 Acute embolism and thrombosis of unspecified deep veins of proximal lower extremity, bilateral: Secondary | ICD-10-CM | POA: Diagnosis not present

## 2023-11-30 DIAGNOSIS — I4891 Unspecified atrial fibrillation: Secondary | ICD-10-CM | POA: Diagnosis not present

## 2023-11-30 DIAGNOSIS — Z7901 Long term (current) use of anticoagulants: Secondary | ICD-10-CM | POA: Diagnosis not present

## 2023-11-30 DIAGNOSIS — I824Y9 Acute embolism and thrombosis of unspecified deep veins of unspecified proximal lower extremity: Secondary | ICD-10-CM | POA: Diagnosis not present

## 2023-11-30 LAB — POCT INR: INR: 3 (ref 2.0–3.0)

## 2023-11-30 NOTE — Patient Instructions (Signed)
Description   Continue taking warfarin1 tablet daily. Recheck INR in 4 weeks. Continue with 3 serving of vitamin K foods per week. [facility]in Clinic [phone]

## 2023-12-31 ENCOUNTER — Ambulatory Visit: Payer: Managed Care, Other (non HMO) | Attending: Cardiovascular Disease

## 2023-12-31 DIAGNOSIS — Z7901 Long term (current) use of anticoagulants: Secondary | ICD-10-CM | POA: Diagnosis not present

## 2023-12-31 DIAGNOSIS — I824Y3 Acute embolism and thrombosis of unspecified deep veins of proximal lower extremity, bilateral: Secondary | ICD-10-CM

## 2023-12-31 LAB — POCT INR: INR: 4.1 — AB (ref 2.0–3.0)

## 2023-12-31 NOTE — Patient Instructions (Signed)
Hold today only then Continue taking warfarin 1 tablet daily.  Recheck INR in 5 weeks.  Continue with 3 servings of vitamin K foods per week.  [facility] [phone]

## 2024-01-06 ENCOUNTER — Other Ambulatory Visit: Payer: Self-pay | Admitting: Neurology

## 2024-01-06 NOTE — Telephone Encounter (Signed)
Last seen on 03/16/23 Follow up scheduled on 03/23/24

## 2024-01-07 ENCOUNTER — Other Ambulatory Visit: Payer: Self-pay | Admitting: Neurology

## 2024-01-10 NOTE — Telephone Encounter (Signed)
Last seen on 03/16/23 Follow up scheduled on 03/23/24

## 2024-01-26 DIAGNOSIS — N390 Urinary tract infection, site not specified: Secondary | ICD-10-CM | POA: Insufficient documentation

## 2024-01-26 DIAGNOSIS — I739 Peripheral vascular disease, unspecified: Secondary | ICD-10-CM | POA: Insufficient documentation

## 2024-01-26 DIAGNOSIS — R519 Headache, unspecified: Secondary | ICD-10-CM | POA: Insufficient documentation

## 2024-01-27 ENCOUNTER — Ambulatory Visit: Payer: Managed Care, Other (non HMO) | Attending: Cardiovascular Disease

## 2024-01-27 DIAGNOSIS — Z7901 Long term (current) use of anticoagulants: Secondary | ICD-10-CM

## 2024-01-27 DIAGNOSIS — I824Y3 Acute embolism and thrombosis of unspecified deep veins of proximal lower extremity, bilateral: Secondary | ICD-10-CM | POA: Diagnosis not present

## 2024-01-27 LAB — POCT INR: INR: 2.1 (ref 2.0–3.0)

## 2024-01-27 NOTE — Patient Instructions (Signed)
Description   Continue taking warfarin 1 tablet daily.  Recheck INR in 6 weeks.  Continue with 3 servings of vitamin K foods per week.  [facility] [phone]

## 2024-03-10 ENCOUNTER — Ambulatory Visit: Payer: Managed Care, Other (non HMO) | Attending: Cardiovascular Disease | Admitting: *Deleted

## 2024-03-10 DIAGNOSIS — I4891 Unspecified atrial fibrillation: Secondary | ICD-10-CM

## 2024-03-10 DIAGNOSIS — I824Y3 Acute embolism and thrombosis of unspecified deep veins of proximal lower extremity, bilateral: Secondary | ICD-10-CM | POA: Diagnosis not present

## 2024-03-10 DIAGNOSIS — Z7901 Long term (current) use of anticoagulants: Secondary | ICD-10-CM | POA: Diagnosis not present

## 2024-03-10 DIAGNOSIS — I824Y9 Acute embolism and thrombosis of unspecified deep veins of unspecified proximal lower extremity: Secondary | ICD-10-CM

## 2024-03-10 LAB — POCT INR: INR: 3.3 — AB (ref 2.0–3.0)

## 2024-03-10 NOTE — Patient Instructions (Addendum)
 Description   Do not take any warfarin today then continue taking warfarin 1 tablet daily.  Recheck INR in 4 weeks.  Continue with 3 servings of vitamin K foods per week.  [facility] [phone]

## 2024-03-23 ENCOUNTER — Ambulatory Visit: Payer: Managed Care, Other (non HMO) | Admitting: Neurology

## 2024-03-23 ENCOUNTER — Encounter: Payer: Self-pay | Admitting: Neurology

## 2024-03-23 VITALS — BP 146/88 | HR 84 | Ht 60.0 in | Wt 155.0 lb

## 2024-03-23 DIAGNOSIS — R269 Unspecified abnormalities of gait and mobility: Secondary | ICD-10-CM

## 2024-03-23 DIAGNOSIS — R42 Dizziness and giddiness: Secondary | ICD-10-CM

## 2024-03-23 DIAGNOSIS — G40909 Epilepsy, unspecified, not intractable, without status epilepticus: Secondary | ICD-10-CM

## 2024-03-23 DIAGNOSIS — G629 Polyneuropathy, unspecified: Secondary | ICD-10-CM

## 2024-03-23 DIAGNOSIS — Z5181 Encounter for therapeutic drug level monitoring: Secondary | ICD-10-CM | POA: Diagnosis not present

## 2024-03-23 DIAGNOSIS — R2689 Other abnormalities of gait and mobility: Secondary | ICD-10-CM

## 2024-03-23 MED ORDER — LAMOTRIGINE 200 MG PO TABS: 200.0000 mg | ORAL_TABLET | Freq: Two times a day (BID) | ORAL | 3 refills | Status: DC

## 2024-03-23 MED ORDER — LAMOTRIGINE 100 MG PO TABS: 50.0000 mg | ORAL_TABLET | Freq: Two times a day (BID) | ORAL | 3 refills | Status: AC

## 2024-03-23 NOTE — Progress Notes (Signed)
 [facility]  PATIENT: Evelyn [name] DOB: Nov 08, 1946  REQUESTING CLINICIAN: Chilton Greathouse, MD HISTORY FROM: Patient and husband  REASON FOR VISIT: Epilepsy follow up   HISTORICAL  CHIEF COMPLAINT:  Chief Complaint  Patient presents with   Gait Problem    Rm12, husband present, Gait and balance problem: pt stated has good and bad days and it fluctuates. She wishes she could get to the cause of it and rectify it      INTERVAL HISTORY 03/23/2024 Patient presents today for follow-up, she is accompanied by her husband.  Last visit was a year ago.  At that time we sent her for PT.  She completed PT, reports some improvement but she still having balance issue, disequilibrium, denies any fall.  She is doing her daily exercises. In terms of her epilepsy, she is compliant with her lamotrigine 250 mg twice daily, denies any seizure or seizure-like activity and no side effect from the medication.   INTERVAL HISTORY 03/16/2023:  Patient presents today for follow-up, she is accompanied by her husband.  Since last visit, she reports that her balance issue has not improved.  She is still unsteady.  Denies any falls.  She also is aware that her fluid intake is not what it should be. In terms of the seizures, she is doing well on lamotrigine, no seizures no side effect from the medication.  Again no falls.   HISTORY OF PRESENT ILLNESS:  This is a 78 year old woman past medical history of seizure disorder, well controlled on lamotrigine, headaches, insomnia, who is presenting with balance problem for the past 2 years.  Patient reports for the past 2 years she had balance problem described as unsteadiness, difficulty with walking.  She denies any room spinning sensation.  She report during this time she had 2 falls, the first 1 was on October 11, 2021.  She was walking and suddenly fell forward like someone pushed her from her back.  The last fall was on July 10.  She reports she  was walking and felt like she could not pick up her foot and she fell to the ground.  Denies any major hand injuries with these falls.  She has follow-up with a neurologist in [place], had extensive work-up including multiple MRIs and CT scans (showing ventriculomegaly ?NPH), VNG and they were all within normal limits.    OTHER MEDICAL CONDITIONS: Seizure disorder, Headaches, Neuropathy   REVIEW OF SYSTEMS: Full 14 system review of systems performed and negative with exception of: As noted in the HPI   ALLERGIES: Allergies  Allergen Reactions   Tegretol [Carbamazepine] Rash    HOME MEDICATIONS: Outpatient Medications Prior to Visit  Medication Sig Dispense Refill   acetaminophen (TYLENOL) 500 MG tablet Take 500 mg by mouth every 6 (six) hours as needed.     ascorbic acid (VITAMIN C) 1000 MG tablet Take 1,000 mg by mouth daily.     Biotin 1 MG CAPS Take 1 mg by mouth daily.     Calcium Citrate-Vitamin D (CALCIUM CITRATE + D PO) Take 1 tablet by mouth 2 (two) times daily with a meal.     cetirizine (ZYRTEC) 10 MG tablet Take 10 mg by mouth daily.     Coenzyme Q10 50 MG CAPS Take 1 capsule by mouth every morning.     Cyanocobalamin (B-12) 2000 MCG TABS Take 1 tablet by mouth daily.     mirtazapine (REMERON) 7.5 MG tablet Take 3.75-7.5 mg by mouth at bedtime.  Multiple Vitamin (MULTIVITAMIN WITH MINERALS) TABS tablet Take 1 tablet by mouth 2 (two) times daily with a meal.     Omega-3 Fatty Acids (OMEGA 3 PO) Take 1 capsule by mouth every morning.     polyethylene glycol (MIRALAX) 17 g packet Take 17 g by mouth 2 (two) times daily. 14 each 0   warfarin (COUMADIN) 7.5 MG tablet TAKE 1 TABLET BY MOUTH DAILY AS DIRECTED BY COAGULATION CLINIC 100 tablet 1   lamoTRIgine (LAMICTAL) 100 MG tablet TAKE 1/2 TABLET BY MOUTH TWICE DAILY WITH 200MG  TABLET FOR A TOTAL DAILY DOSE OF 250MG  90 tablet 0   lamoTRIgine (LAMICTAL) 200 MG tablet TAKE 1 TABLET BY MOUTH TWICE DAILY. TAKE WITH 50 MG TABLETS FOR  A TOTAL DOSE OF 250MG  TWICE DAILY 180 tablet 0   HYDROcodone-acetaminophen (NORCO/VICODIN) 5-325 MG tablet Take 1 tablet by mouth every 6 (six) hours as needed for moderate pain.     No facility-administered medications prior to visit.    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, but unable to be removed, being treated by Dr Isaiah Serge at [facility])    History of echocardiogram 03/11/09   nl EF 60-65%   History of recurrent UTIs    feels like may have one now   PE (pulmonary embolism) 2005   Peripheral vascular disease (HCC)    dvt leg rt   Seizures (HCC)     PAST SURGICAL HISTORY: Past Surgical History:  Procedure Laterality Date   arm surgery Right    INSERTION OF VENA CAVA FILTER  10   KNEE SURGERY Left 96   kneecap fx   ORIF HUMERUS FRACTURE Right 08/01/2013   Procedure: NON-UNION REPAIR RIGHT PROXIMAL HUMERUS FRACTURE;  Surgeon: Budd Palmer, MD;  Location: [facility] OR;  Service: Orthopedics;  Laterality: Right;    FAMILY HISTORY: Family History  Problem Relation Age of Onset   Diabetes Mother    Stomach cancer Mother    Stroke Father    Esophageal cancer Neg Hx    Colon cancer Neg Hx    Liver disease Neg Hx    Pancreatic cancer Neg Hx     SOCIAL HISTORY: Social History   Socioeconomic History   Marital status: Married    Spouse name: Not on file   Number of children: Not on file   Years of education: Not on file   Highest education level: Not on file  Occupational History   Not on file  Tobacco Use   Smoking status: Never   Smokeless tobacco: Never  Substance and Sexual Activity   Alcohol use: No   Drug use: No   Sexual activity: Not on file  Other Topics Concern   Not on file  Social History Narrative   Not on file   Social Drivers of Health   Financial Resource Strain: Not on file  Food Insecurity:  Not on file  Transportation Needs: Not on file  Physical Activity: Not on file  Stress: Not on file  Social Connections: Not on file  Intimate Partner Violence: Not on file    PHYSICAL EXAM  GENERAL EXAM/CONSTITUTIONAL: Vitals:  Vitals:   03/23/24 1552  BP: (!) 160/85  Pulse: 84  Weight: 155 lb (70.3 kg)  Height: 5' (1.524 m)     Body mass index is 30.27 kg/m. Wt Readings  from Last 3 Encounters:  03/23/24 155 lb (70.3 kg)  09/03/23 158 lb 12.8 oz (72 kg)  03/16/23 155 lb (70.3 kg)   Patient is in no distress; well developed, nourished and groomed; neck is supple  MUSCULOSKELETAL: Gait, strength, tone, movements noted in Neurologic exam below  NEUROLOGIC: MENTAL STATUS:      No data to display         awake, alert, oriented to person, place and time recent and remote memory intact normal attention and concentration language fluent, comprehension intact, naming intact fund of knowledge appropriate  CRANIAL NERVE:  2nd, 3rd, 4th, 6th - visual fields full to confrontation, extraocular muscles intact, no nystagmus 5th - facial sensation symmetric 7th - facial strength symmetric 8th - hearing intact 9th - palate elevates symmetrically, uvula midline 11th - shoulder shrug symmetric 12th - tongue protrusion midline  MOTOR:  normal bulk and tone, full strength in the BUE, BLE  SENSORY:  normal and symmetric to light touch, pinprick and decrease vibration up to mid shin   COORDINATION:  finger-nose-finger, fine finger movements normal  REFLEXES:  deep tendon reflexes present and symmetric  GAIT/STATION:  Normal, no evidence of magnetic gait. Unable to tandem, positive Romberg.    DIAGNOSTIC DATA (LABS, IMAGING, TESTING) - I reviewed patient records, labs, notes, testing and imaging myself where available.  Lab Results  Component Value Date   WBC 7.2 06/22/2022   HGB 11.3 (L) 06/22/2022   HCT 35.6 (L) 06/22/2022   MCV 86.4 06/22/2022   PLT 274  06/22/2022      Component Value Date/Time   NA 143 09/25/2022 1139   K 4.7 09/25/2022 1139   CL 102 09/25/2022 1139   CO2 25 09/25/2022 1139   GLUCOSE 119 (H) 09/25/2022 1139   GLUCOSE 129 (H) 06/22/2022 2119   BUN 25 09/25/2022 1139   CREATININE 0.83 09/25/2022 1139   CALCIUM 9.9 09/25/2022 1139   PROT 7.7 08/01/2021 1537   ALBUMIN 3.7 08/01/2021 1537   AST 30 08/01/2021 1537   ALT 12 08/01/2021 1537   ALKPHOS 100 08/01/2021 1537   BILITOT 0.4 08/01/2021 1537   GFRNONAA 41 (L) 06/22/2022 2119   GFRAA >60 12/29/2015 1026   Lab Results  Component Value Date   CHOL (H) 03/09/2009    245        ATP III CLASSIFICATION:  <200     mg/dL   Desirable  811-914  mg/dL   Borderline High  >=782    mg/dL   High          HDL 99 03/09/2009   LDLCALC (H) 03/09/2009    137        Total Cholesterol/HDL:CHD Risk Coronary Heart Disease Risk Table                     Men   Women  1/2 Average Risk   3.4   3.3  Average Risk       5.0   4.4  2 X Average Risk   9.6   7.1  3 X Average Risk  23.4   11.0        Use the calculated Patient Ratio above and the CHD Risk Table to determine the patient's CHD Risk.        ATP III CLASSIFICATION (LDL):  <100     mg/dL   Optimal  956-213  mg/dL   Near or Above  Optimal  130-159  mg/dL   Borderline  161-096  mg/dL   High  >045     mg/dL   Very High   TRIG 47 40/98/1191   CHOLHDL 2.5 03/09/2009   No results found for: "HGBA1C" No results found for: "VITAMINB12" Lab Results  Component Value Date   TSH 2.567 08/01/2021   B12 level 1353 Folate 12.5  Head CT 06/22/22 1. No acute intracranial hemorrhage. 2. 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 3. No acute facial fracture   MRI Brain 08/26/21, MRV Head 08/26/21 There is no reduced parenchymal diffusivity. The basal cisterns are patent. No intracranial mass effect. Redemonstration of mild supratentorial hydrocephalus. The size  and configuration of the ventricles is not remarkably changed from December 2021.  1.  Redemonstrated mild supratentorial hydrocephalus. The size and configuration of the ventricles is unchanged from December 2021.  2. Mild burden of chronic small vessel ischemic disease.  3. No evidence of venous thrombosis.  VNG 11/18/21 Normal    ASSESSMENT AND PLAN  78 y.o. year old female with longstanding history of epilepsy well controlled on lamotrigine 250 mg twice daily, headaches, insomnia who is presenting for follow up for her balance problem and epilepsy.  She has completed PT, denies any falls since last visit.  Plan will be for patient to continue her daily exercise, to consider using assistive device: cane or walker with long distance. In terms of her seizures, again no seizure or seizure like activity.  She is compliant with lamotrigine 250 mg twice daily, no side effect from the medication.  Plan will be to refill her medication and I will see her in 1 year for follow-up.  Advised patient to contact me if she does have any breakthrough seizure.  She voiced understanding.     1. Seizure disorder (HCC)   2. Therapeutic drug monitoring   3. Balance problem   4. Gait abnormality   5. Dysequilibrium   6. Neuropathy      Patient Instructions  Continue current medications Refill lamotrigine 250 mg twice daily given Will check lamotrigine level with BMP Continue with your daily exercise Consider assistive device when walking long distance Return in 1 year or sooner if worse.  Orders Placed This Encounter  Procedures   Lamotrigine level   Basic Metabolic Panel    Meds ordered this encounter  Medications   lamoTRIgine (LAMICTAL) 100 MG tablet    Sig: Take 0.5 tablets (50 mg total) by mouth 2 (two) times daily.    Dispense:  90 tablet    Refill:  3    To take with the 200 mg for a total of 250 twice daily   lamoTRIgine (LAMICTAL) 200 MG tablet    Sig: Take 1 tablet (200 mg  total) by mouth 2 (two) times daily.    Dispense:  180 tablet    Refill:  3    Return in about 1 year (around 03/23/2025).   Windell Norfolk, MD 03/23/2024, 4:24 PM  [facility] [address] [place], [place] [address] [phone]

## 2024-03-23 NOTE — Patient Instructions (Signed)
 Continue current medications Refill lamotrigine 250 mg twice daily given Will check lamotrigine level with BMP Continue with your daily exercise Consider assistive device when walking long distance Return in 1 year or sooner if worse.

## 2024-03-24 LAB — BASIC METABOLIC PANEL WITH GFR
BUN/Creatinine Ratio: 35 — ABNORMAL HIGH (ref 12–28)
BUN: 23 mg/dL (ref 8–27)
CO2: 23 mmol/L (ref 20–29)
Calcium: 9.5 mg/dL (ref 8.7–10.3)
Chloride: 103 mmol/L (ref 96–106)
Creatinine, Ser: 0.65 mg/dL (ref 0.57–1.00)
Glucose: 95 mg/dL (ref 70–99)
Potassium: 4.7 mmol/L (ref 3.5–5.2)
Sodium: 141 mmol/L (ref 134–144)
eGFR: 91 mL/min/{1.73_m2} (ref >59)

## 2024-03-24 LAB — LAMOTRIGINE LEVEL: Lamotrigine Lvl: 11.6 ug/mL (ref 2.0–20.0)

## 2024-03-29 ENCOUNTER — Encounter: Payer: Self-pay | Admitting: Neurology

## 2024-04-05 ENCOUNTER — Ambulatory Visit: Attending: Cardiovascular Disease

## 2024-04-05 DIAGNOSIS — I824Y3 Acute embolism and thrombosis of unspecified deep veins of proximal lower extremity, bilateral: Secondary | ICD-10-CM | POA: Diagnosis not present

## 2024-04-05 DIAGNOSIS — Z7901 Long term (current) use of anticoagulants: Secondary | ICD-10-CM

## 2024-04-05 LAB — POCT INR: INR: 3.6 — AB (ref 2.0–3.0)

## 2024-04-05 NOTE — Patient Instructions (Signed)
 Description   Do not take any warfarin today then START taking warfarin 1 tablet daily except 1/2 tablet on Sundays.  Recheck INR in 2 weeks.  Continue with 3 servings of vitamin K foods per week.  Coumadin  Clinic [phone]

## 2024-04-21 ENCOUNTER — Ambulatory Visit: Attending: Cardiovascular Disease

## 2024-04-21 DIAGNOSIS — I824Y3 Acute embolism and thrombosis of unspecified deep veins of proximal lower extremity, bilateral: Secondary | ICD-10-CM | POA: Diagnosis not present

## 2024-04-21 DIAGNOSIS — Z7901 Long term (current) use of anticoagulants: Secondary | ICD-10-CM

## 2024-04-21 LAB — POCT INR: INR: 3.5 — AB (ref 2.0–3.0)

## 2024-04-21 NOTE — Patient Instructions (Signed)
 START taking warfarin 1 tablet daily except 1/2 tablet on Saturdays and Sundays.  Recheck INR in 3 weeks.  Continue with 3 servings of vitamin K foods per week.  Coumadin  Clinic [phone]

## 2024-05-12 ENCOUNTER — Ambulatory Visit

## 2024-05-19 ENCOUNTER — Ambulatory Visit: Attending: Cardiovascular Disease

## 2024-05-19 DIAGNOSIS — I824Y3 Acute embolism and thrombosis of unspecified deep veins of proximal lower extremity, bilateral: Secondary | ICD-10-CM | POA: Diagnosis not present

## 2024-05-19 DIAGNOSIS — Z7901 Long term (current) use of anticoagulants: Secondary | ICD-10-CM | POA: Diagnosis not present

## 2024-05-19 LAB — POCT INR: INR: 1.7 — AB (ref 2.0–3.0)

## 2024-05-19 NOTE — Patient Instructions (Signed)
 Take 1.5 tablets today only then Continue taking warfarin 1 tablet daily except 1/2 tablet on Saturdays and Sundays.  Recheck INR in 3 weeks.  Continue with 3 servings of vitamin K foods per week.  Coumadin  Clinic [phone]

## 2024-06-09 ENCOUNTER — Ambulatory Visit: Attending: Cardiovascular Disease

## 2024-06-09 DIAGNOSIS — I824Y3 Acute embolism and thrombosis of unspecified deep veins of proximal lower extremity, bilateral: Secondary | ICD-10-CM

## 2024-06-09 DIAGNOSIS — Z7901 Long term (current) use of anticoagulants: Secondary | ICD-10-CM | POA: Diagnosis not present

## 2024-06-09 LAB — POCT INR: INR: 2.9 (ref 2.0–3.0)

## 2024-06-09 NOTE — Progress Notes (Signed)
Please see anticoagulation encounter.

## 2024-06-09 NOTE — Patient Instructions (Signed)
 Continue taking warfarin 1 tablet daily except 1/2 tablet on Saturdays and Sundays.  Recheck INR in 4 weeks.  Continue with 3 servings of vitamin K foods per week.  Coumadin  Clinic [phone]

## 2024-07-14 ENCOUNTER — Ambulatory Visit

## 2024-07-17 ENCOUNTER — Other Ambulatory Visit: Payer: Self-pay | Admitting: Cardiovascular Disease

## 2024-07-17 DIAGNOSIS — I4891 Unspecified atrial fibrillation: Secondary | ICD-10-CM

## 2024-07-17 NOTE — Telephone Encounter (Signed)
 Warfarin 7.5mg  refill Afib, DVT Last INR-06/09/24 Last OV-09/03/23

## 2024-07-21 ENCOUNTER — Telehealth: Payer: Self-pay | Admitting: Neurology

## 2024-07-21 MED ORDER — LAMOTRIGINE 200 MG PO TABS: 200.0000 mg | ORAL_TABLET | Freq: Two times a day (BID) | ORAL | 3 refills | Status: AC

## 2024-07-21 NOTE — Telephone Encounter (Signed)
 refilled

## 2024-07-21 NOTE — Telephone Encounter (Signed)
 James from [facility] called to request refill for medication   lamoTRIgine  (LAMICTAL ) 200 MG tablet  Medication is to be sent to    [id number] - [place], [place] - [address] (Ph: [phone])

## 2024-07-28 ENCOUNTER — Ambulatory Visit: Attending: Cardiovascular Disease

## 2024-07-28 DIAGNOSIS — I824Y3 Acute embolism and thrombosis of unspecified deep veins of proximal lower extremity, bilateral: Secondary | ICD-10-CM

## 2024-07-28 DIAGNOSIS — Z7901 Long term (current) use of anticoagulants: Secondary | ICD-10-CM | POA: Diagnosis not present

## 2024-07-28 LAB — POCT INR: INR: 1.3 — AB (ref 2.0–3.0)

## 2024-07-28 NOTE — Patient Instructions (Signed)
 Take 2 tablets today only then Continue taking warfarin 1 tablet daily except 1/2 tablet on Saturdays and Sundays.  Recheck INR in 2 weeks.  Continue with 3 servings of vitamin K foods per week.  Coumadin Clinic [phone]

## 2024-07-28 NOTE — Progress Notes (Signed)
 INR 1.3 Please see anticoagulation encounter

## 2024-08-11 ENCOUNTER — Ambulatory Visit: Attending: Cardiovascular Disease

## 2024-08-11 DIAGNOSIS — I824Y3 Acute embolism and thrombosis of unspecified deep veins of proximal lower extremity, bilateral: Secondary | ICD-10-CM

## 2024-08-11 DIAGNOSIS — Z7901 Long term (current) use of anticoagulants: Secondary | ICD-10-CM | POA: Diagnosis not present

## 2024-08-11 LAB — POCT INR: INR: 3.1 — AB (ref 2.0–3.0)

## 2024-08-11 NOTE — Patient Instructions (Signed)
 Continue taking warfarin 1 tablet daily except 1/2 tablet on Saturdays and Sundays.  Recheck INR in 3 weeks.  Continue with 3 servings of vitamin K foods per week.  Coumadin  Clinic [phone]

## 2024-08-11 NOTE — Progress Notes (Signed)
 INR 3.1 Please see anticoagulation encounter Continue taking warfarin 1 tablet daily except 1/2 tablet on Saturdays and Sundays.  Recheck INR in 3 weeks.  Continue with 3 servings of vitamin K foods per week.  Coumadin  Clinic [phone]

## 2024-08-21 ENCOUNTER — Other Ambulatory Visit: Payer: Self-pay

## 2024-08-21 ENCOUNTER — Observation Stay (HOSPITAL_COMMUNITY): Admission: EM | Admit: 2024-08-21 | Discharge: 2024-08-22 | Disposition: A | Discharge status: Home / Self Care

## 2024-08-21 ENCOUNTER — Emergency Department (HOSPITAL_COMMUNITY)

## 2024-08-21 ENCOUNTER — Encounter (HOSPITAL_COMMUNITY): Payer: Self-pay

## 2024-08-21 DIAGNOSIS — I4891 Unspecified atrial fibrillation: Principal | ICD-10-CM | POA: Diagnosis present

## 2024-08-21 DIAGNOSIS — I2510 Atherosclerotic heart disease of native coronary artery without angina pectoris: Secondary | ICD-10-CM | POA: Diagnosis not present

## 2024-08-21 DIAGNOSIS — D649 Anemia, unspecified: Secondary | ICD-10-CM | POA: Diagnosis not present

## 2024-08-21 DIAGNOSIS — Z86711 Personal history of pulmonary embolism: Secondary | ICD-10-CM

## 2024-08-21 DIAGNOSIS — Z86718 Personal history of other venous thrombosis and embolism: Secondary | ICD-10-CM | POA: Diagnosis not present

## 2024-08-21 DIAGNOSIS — R7989 Other specified abnormal findings of blood chemistry: Secondary | ICD-10-CM | POA: Diagnosis not present

## 2024-08-21 DIAGNOSIS — I700 Atherosclerosis of aorta: Secondary | ICD-10-CM | POA: Insufficient documentation

## 2024-08-21 DIAGNOSIS — R002 Palpitations: Secondary | ICD-10-CM | POA: Diagnosis present

## 2024-08-21 DIAGNOSIS — R569 Unspecified convulsions: Secondary | ICD-10-CM | POA: Insufficient documentation

## 2024-08-21 DIAGNOSIS — I82409 Acute embolism and thrombosis of unspecified deep veins of unspecified lower extremity: Secondary | ICD-10-CM | POA: Diagnosis present

## 2024-08-21 HISTORY — DX: Unspecified atrial fibrillation: I48.91

## 2024-08-21 LAB — I-STAT CHEM 8, ED
BUN: 23 mg/dL (ref 8–23)
Calcium, Ion: 1.17 mmol/L (ref 1.15–1.40)
Chloride: 108 mmol/L (ref 98–111)
Creatinine, Ser: 0.8 mg/dL (ref 0.44–1.00)
Glucose, Bld: 102 mg/dL — ABNORMAL HIGH (ref 70–99)
HCT: 38 % (ref 36.0–46.0)
Hemoglobin: 12.9 g/dL (ref 12.0–15.0)
Potassium: 4.7 mmol/L (ref 3.5–5.1)
Sodium: 141 mmol/L (ref 135–145)
TCO2: 24 mmol/L (ref 22–32)

## 2024-08-21 LAB — CBC WITH DIFFERENTIAL/PLATELET
Abs Immature Granulocytes: 0.01 K/uL (ref 0.00–0.07)
Basophils Absolute: 0 K/uL (ref 0.0–0.1)
Basophils Relative: 1 %
Eosinophils Absolute: 0.1 K/uL (ref 0.0–0.5)
Eosinophils Relative: 2 %
HCT: 37.7 % (ref 36.0–46.0)
Hemoglobin: 11.9 g/dL — ABNORMAL LOW (ref 12.0–15.0)
Immature Granulocytes: 0 %
Lymphocytes Relative: 25 %
Lymphs Abs: 2.1 K/uL (ref 0.7–4.0)
MCH: 27.6 pg (ref 26.0–34.0)
MCHC: 31.6 g/dL (ref 30.0–36.0)
MCV: 87.5 fL (ref 80.0–100.0)
Monocytes Absolute: 0.6 K/uL (ref 0.1–1.0)
Monocytes Relative: 8 %
Neutro Abs: 5.6 K/uL (ref 1.7–7.7)
Neutrophils Relative %: 64 %
Platelets: 298 K/uL (ref 150–400)
RBC: 4.31 MIL/uL (ref 3.87–5.11)
RDW: 14.6 % (ref 11.5–15.5)
WBC: 8.5 K/uL (ref 4.0–10.5)
nRBC: 0 % (ref 0.0–0.2)

## 2024-08-21 LAB — BASIC METABOLIC PANEL WITH GFR
Anion gap: 12 (ref 5–15)
BUN: 20 mg/dL (ref 8–23)
CO2: 22 mmol/L (ref 22–32)
Calcium: 9.2 mg/dL (ref 8.9–10.3)
Chloride: 105 mmol/L (ref 98–111)
Creatinine, Ser: 0.73 mg/dL (ref 0.44–1.00)
GFR, Estimated: >60 mL/min (ref >60)
Glucose, Bld: 105 mg/dL — ABNORMAL HIGH (ref 70–99)
Potassium: 4.6 mmol/L (ref 3.5–5.1)
Sodium: 139 mmol/L (ref 135–145)

## 2024-08-21 LAB — TROPONIN I (HIGH SENSITIVITY)
Troponin I (High Sensitivity): 38 ng/L — ABNORMAL HIGH (ref <18)
Troponin I (High Sensitivity): 40 ng/L — ABNORMAL HIGH (ref <18)

## 2024-08-21 LAB — PROTIME-INR
INR: 2 — ABNORMAL HIGH (ref 0.8–1.2)
Prothrombin Time: 23.7 s — ABNORMAL HIGH (ref 11.4–15.2)

## 2024-08-21 LAB — MAGNESIUM: Magnesium: 2 mg/dL (ref 1.7–2.4)

## 2024-08-21 MED ORDER — ACETAMINOPHEN 325 MG PO TABS
650.0000 mg | ORAL_TABLET | Freq: Once | ORAL | Status: AC
  Administered 2024-08-21: 650 mg via ORAL
  Filled 2024-08-21: qty 2

## 2024-08-21 MED ORDER — DILTIAZEM HCL-DEXTROSE 125-5 MG/125ML-% IV SOLN (PREMIX)
5.0000 mg/h | INTRAVENOUS | Status: DC
  Administered 2024-08-21: 5 mg/h via INTRAVENOUS
  Filled 2024-08-21: qty 125

## 2024-08-21 MED ORDER — DILTIAZEM HCL 25 MG/5ML IV SOLN
10.0000 mg | Freq: Once | INTRAVENOUS | Status: AC
  Administered 2024-08-21: 10 mg via INTRAVENOUS
  Filled 2024-08-21: qty 5

## 2024-08-21 MED ORDER — WARFARIN SODIUM 7.5 MG PO TABS
7.5000 mg | ORAL_TABLET | Freq: Once | ORAL | Status: AC
  Administered 2024-08-21: 7.5 mg via ORAL
  Filled 2024-08-21: qty 1

## 2024-08-21 MED ORDER — WARFARIN - PHARMACIST DOSING INPATIENT: Freq: Every day | Status: DC

## 2024-08-21 MED ORDER — METOPROLOL TARTRATE 5 MG/5ML IV SOLN
5.0000 mg | Freq: Once | INTRAVENOUS | Status: AC
  Administered 2024-08-21: 5 mg via INTRAVENOUS
  Filled 2024-08-21: qty 5

## 2024-08-21 MED ORDER — MIRTAZAPINE 15 MG PO TABS
7.5000 mg | ORAL_TABLET | Freq: Every day | ORAL | Status: DC
Start: 2024-08-21 — End: 2024-08-22
  Administered 2024-08-22: 7.5 mg via ORAL
  Filled 2024-08-21: qty 1

## 2024-08-21 MED ORDER — SODIUM CHLORIDE 0.9 % IV BOLUS
500.0000 mL | Freq: Once | INTRAVENOUS | Status: AC
  Administered 2024-08-21: 500 mL via INTRAVENOUS

## 2024-08-21 MED ORDER — IOHEXOL 350 MG/ML SOLN
75.0000 mL | Freq: Once | INTRAVENOUS | Status: AC | PRN
  Administered 2024-08-21: 75 mL via INTRAVENOUS

## 2024-08-21 NOTE — H&P (Incomplete)
 History and Physical    Evelyn [name] FMW:[id number] DOB: 17-Mar-1946 DOA: 08/21/2024  Patient coming from: Home.  Chief Complaint: Palpitations.  HPI: Evelyn [name] is a 78 y.o. 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  Patient states that this evening she was at home and she started having palpitations.  Heart rate went up to 150 to 160/min.  Had mild shortness of breath denies any chest pain.  Denies any recent change in medications.  ED Course: In the ER patient was found to be in atrial flutter/fibrillation and was given Cardizem  bolus and Cardizem  infusion after IV metoprolol .  While on infusion patient's rhythm converted to normal sinus rhythm.  Troponin was 38 and second one is 40.  Hemoglobin 11.9.  CT angiogram of the chest was negative for PE.  INR was 2.  Review of Systems: As per HPI, rest all negative.   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, but unable to be removed, being treated by Dr Darcy at [facility])    History of echocardiogram 03/11/09   nl EF 60-65%   History of recurrent UTIs    feels like may have one now   PE (pulmonary embolism) 2005   Peripheral vascular disease (HCC)    dvt leg rt   Seizures (HCC)     Past Surgical History:  Procedure Laterality Date   arm surgery Right    INSERTION OF VENA CAVA FILTER  10   KNEE SURGERY Left 96   kneecap fx   ORIF HUMERUS FRACTURE Right 08/01/2013   Procedure: NON-UNION REPAIR RIGHT PROXIMAL HUMERUS FRACTURE;  Surgeon: Ozell VEAR Bruch, MD;  Location: [facility] OR;  Service: Orthopedics;  Laterality: Right;     reports that she has never smoked. She has never used smokeless tobacco. She reports that  she does not drink alcohol and does not use drugs.  Allergies  Allergen Reactions   Tegretol [Carbamazepine] Rash    Family History  Problem Relation Age of Onset   Diabetes Mother    Stomach cancer Mother    Stroke Father    Esophageal cancer Neg Hx    Colon cancer Neg Hx    Liver disease Neg Hx    Pancreatic cancer Neg Hx     Prior to Admission medications   Medication Sig Start Date End Date Taking? Authorizing Provider  acetaminophen  (TYLENOL ) 500 MG tablet Take 500 mg by mouth every 6 (six) hours as needed.    [provider]  ascorbic acid (VITAMIN C) 1000 MG tablet Take 1,000 mg by mouth daily.    [provider]  Biotin 1 MG CAPS Take 1 mg by mouth daily.    [provider]  Calcium Citrate-Vitamin D (CALCIUM CITRATE + D PO) Take 1 tablet by mouth 2 (two) times daily with a meal.    [provider]  cetirizine (ZYRTEC) 10 MG tablet Take 10 mg by mouth daily.    [provider]  Coenzyme Q10 50 MG CAPS Take 1 capsule by mouth every morning.    [provider]  Cyanocobalamin (B-12) 2000 MCG TABS Take 1 tablet by mouth daily. 06/13/20   [provider]  lamoTRIgine  (LAMICTAL ) 100 MG tablet Take 0.5 tablets (50 mg total) by mouth 2 (two) times daily. 03/23/24 03/18/25  Camara, Amadou, MD  lamoTRIgine  (LAMICTAL ) 200 MG tablet Take 1 tablet (200 mg total) by mouth 2 (two) times daily. 07/21/24 07/16/25  Camara, Amadou, MD  mirtazapine  (REMERON ) 7.5 MG tablet Take 3.75-7.5 mg by mouth at bedtime. 08/16/22   [provider]  Multiple Vitamin (MULTIVITAMIN WITH MINERALS) TABS tablet Take 1 tablet by mouth 2 (two) times daily with a meal.    [provider]  Omega-3 Fatty Acids (OMEGA 3 PO) Take 1 capsule by mouth every morning.    [provider]  polyethylene glycol (MIRALAX ) 17 g packet Take 17 g by mouth 2 (two) times daily. 11/03/19   Leigh Elspeth SQUIBB, MD  warfarin (COUMADIN ) 7.5 MG tablet TAKE 1  TABLET BY MOUTH DAILY AS DIRECTED BY COAGULATION CLINIC 07/17/24   Court Dorn PARAS, MD    Physical Exam: Constitutional: Moderately built and nourished. Vitals:   08/21/24 1815 08/21/24 1845 08/21/24 1915 08/21/24 1945  BP: 122/87 (!) 134/91 (!) 132/103 136/85  Pulse: (!) 148 (!) 136 (!) 136 (!) 125  Resp: 20 19 13 13   Temp:      TempSrc:      SpO2: 99% 97% 99% 98%  Weight:      Height:       Eyes: Anicteric no pallor. ENMT: No discharge from the ears eyes nose or mouth. Neck: No mass felt.  No neck rigidity. Respiratory: No rhonchi or crepitations. Cardiovascular: S1-S2 heard. Abdomen: Soft nontender bowel sound present. Musculoskeletal: No edema. Skin: No rash. Neurologic: Alert awake oriented to time place and person.  Moves all extremities. Psychiatric: Appears normal.  Normal affect.   Labs on Admission: I have personally reviewed following labs and imaging studies  CBC: Recent Labs  Lab 08/21/24 1830 08/21/24 1839  WBC 8.5  --   NEUTROABS 5.6  --   HGB 11.9* 12.9  HCT 37.7 38.0  MCV 87.5  --   PLT 298  --    Basic Metabolic Panel: Recent Labs  Lab 08/21/24 1830 08/21/24 1839  NA 139 141  K 4.6 4.7  CL 105 108  CO2 22  --   GLUCOSE 105* 102*  BUN 20 23  CREATININE 0.73 0.80  CALCIUM 9.2  --   MG 2.0  --    GFR: Estimated Creatinine Clearance: 60 mL/min (by C-G formula based on SCr of 0.8 mg/dL). Liver Function Tests: No results for input(s): AST, ALT, ALKPHOS, BILITOT, PROT, ALBUMIN in the last 168 hours. No results for input(s): LIPASE, AMYLASE in the last 168 hours. No results for input(s): AMMONIA in the last 168 hours. Coagulation Profile: Recent Labs  Lab 08/21/24 1830  INR 2.0*   Cardiac Enzymes: No results for input(s): CKTOTAL, CKMB, CKMBINDEX, TROPONINI in the last 168 hours. BNP (last 3 results) No results for input(s): PROBNP in the last 8760 hours. HbA1C: No results for input(s): HGBA1C in the  last 72 hours. CBG: No results for input(s): GLUCAP in the last 168 hours. Lipid Profile: No results for input(s): CHOL, HDL, LDLCALC, TRIG, CHOLHDL, LDLDIRECT in the last 72 hours. Thyroid  Function Tests: No results for input(s): TSH, T4TOTAL, FREET4, T3FREE, THYROIDAB in the last 72 hours. Anemia Panel: No results for input(s): VITAMINB12, FOLATE, FERRITIN, TIBC, IRON, RETICCTPCT in the last 72 hours. Urine analysis:    Component Value Date/Time   COLORURINE YELLOW 08/02/2013 1018   APPEARANCEUR  CLEAR 08/02/2013 1018   LABSPEC 1.012 08/02/2013 1018   PHURINE 6.5 08/02/2013 1018   GLUCOSEU NEGATIVE 08/02/2013 1018   HGBUR NEGATIVE 08/02/2013 1018   BILIRUBINUR NEGATIVE 08/02/2013 1018   KETONESUR NEGATIVE 08/02/2013 1018   PROTEINUR NEGATIVE 08/02/2013 1018   UROBILINOGEN 0.2 08/02/2013 1018   NITRITE NEGATIVE 08/02/2013 1018   LEUKOCYTESUR TRACE (A) 08/02/2013 1018   Sepsis Labs: @LABRCNTIP (procalcitonin:4,lacticidven:4) )No results found for this or any previous visit (from the past 240 hours).   Radiological Exams on Admission: CT Angio Chest Pulmonary Embolism (PE) W or WO Contrast Result Date: 08/21/2024 CLINICAL DATA:  Tachycardia, history of pulmonary embolus EXAM: CT ANGIOGRAPHY CHEST WITH CONTRAST TECHNIQUE: Multidetector CT imaging of the chest was performed using the standard protocol during bolus administration of intravenous contrast. Multiplanar CT image reconstructions and MIPs were obtained to evaluate the vascular anatomy. RADIATION DOSE REDUCTION: This exam was performed according to the departmental dose-optimization program which includes automated exposure control, adjustment of the mA and/or kV according to patient size and/or use of iterative reconstruction technique. CONTRAST:  75mL OMNIPAQUE  IOHEXOL  350 MG/ML SOLN COMPARISON:  07/24/2013, 03/08/2009 FINDINGS: Cardiovascular: This is a technically adequate evaluation of the  pulmonary vasculature. No filling defects or pulmonary emboli. The heart is unremarkable without pericardial effusion. No evidence of thoracic aortic aneurysm or dissection. Atherosclerosis of the aorta and coronary vasculature. Calcifications of the mitral and aortic valves. Mediastinum/Nodes: No enlarged mediastinal, hilar, or axillary lymph nodes. Thyroid  gland, trachea, and esophagus demonstrate no significant findings. Lungs/Pleura: Bibasilar subsegmental atelectasis or scarring. No acute airspace disease, effusion, or pneumothorax. Central airways are patent. Upper Abdomen: No acute abnormality. Musculoskeletal: No acute or destructive bony abnormalities. Reconstructed images demonstrate no additional findings. Review of the MIP images confirms the above findings. IMPRESSION: 1. No evidence of pulmonary embolus. 2. No acute intrathoracic process. 3. Aortic Atherosclerosis (ICD10-I70.0). Coronary artery atherosclerosis. Electronically Signed   By: Ozell Daring M.D.   On: 08/21/2024 20:40    EKG: Independently reviewed.  A-fib with RVR.  Assessment/Plan Principal Problem:   Atrial fibrillation with rapid ventricular response (HCC) Active Problems:   New onset atrial fibrillation (HCC)    A-fib/atrial flutter RVR converted back to sinus rhythm.  Patient was initially started on Cardizem  infusion after bolus and patient subsequently converted to sinus rhythm.  Presently asymptomatic.  Will check TSH 2D echo.  Patient is already on anticoagulation for prior history of PE.  Patient likely will need referral to A-fib clinic.  Patient's CHADS2 Vasc score is at least 3. Elevated troponin but flat.  Likely from tachycardia.  Denies any chest pain.  Check 2D echo. 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  Coumadin  management per pharmacy. History of seizures on Lamictal . Anemia appears to be chronic.  Follow CBC.   DVT prophylaxis: Coumadin . Code Status:  Full code. Family Communication: Patient's husband at the bedside. Disposition Plan: Monitored bed. Consults called: Discussed with cardiology. Admission status: Observation.

## 2024-08-21 NOTE — ED Provider Notes (Addendum)
 [name] Provider Note   CSN: [id number] Arrival date & time: 08/21/24  1726     Patient presents with: Atrial Fibrillation and Tachycardia   Evelyn [name] is a 78 y.o. female.   78 year old female presents for evaluation of palpitations.  States has been going on since 8 AM this morning.  She denies any chest pain.  Has no history of A-fib.  She is on Coumadin  for PE.  Last INR was checked on Friday and she states her INR was 3.1.  Denies missing any doses.  Denies any other symptoms or concerns.   Atrial Fibrillation Pertinent negatives include no chest pain, no abdominal pain and no shortness of breath.       Prior to Admission medications   Medication Sig Start Date End Date Taking? Authorizing Provider  acetaminophen  (TYLENOL ) 500 MG tablet Take 500 mg by mouth every 6 (six) hours as needed.    [provider]  ascorbic acid (VITAMIN C) 1000 MG tablet Take 1,000 mg by mouth daily.    [provider]  Biotin 1 MG CAPS Take 1 mg by mouth daily.    [provider]  Calcium Citrate-Vitamin D (CALCIUM CITRATE + D PO) Take 1 tablet by mouth 2 (two) times daily with a meal.    [provider]  cetirizine (ZYRTEC) 10 MG tablet Take 10 mg by mouth daily.    [provider]  Coenzyme Q10 50 MG CAPS Take 1 capsule by mouth every morning.    [provider]  Cyanocobalamin (B-12) 2000 MCG TABS Take 1 tablet by mouth daily. 06/13/20   [provider]  lamoTRIgine  (LAMICTAL ) 100 MG tablet Take 0.5 tablets (50 mg total) by mouth 2 (two) times daily. 03/23/24 03/18/25  Camara, Amadou, MD  lamoTRIgine  (LAMICTAL ) 200 MG tablet Take 1 tablet (200 mg total) by mouth 2 (two) times daily. 07/21/24 07/16/25  Camara, Amadou, MD  mirtazapine  (REMERON ) 7.5 MG tablet Take 3.75-7.5 mg by mouth at bedtime. 08/16/22   [provider]  Multiple Vitamin (MULTIVITAMIN WITH MINERALS) TABS tablet Take  1 tablet by mouth 2 (two) times daily with a meal.    [provider]  Omega-3 Fatty Acids (OMEGA 3 PO) Take 1 capsule by mouth every morning.    [provider]  polyethylene glycol (MIRALAX ) 17 g packet Take 17 g by mouth 2 (two) times daily. 11/03/19   Leigh Elspeth SQUIBB, MD  warfarin (COUMADIN ) 7.5 MG tablet TAKE 1 TABLET BY MOUTH DAILY AS DIRECTED BY COAGULATION CLINIC 07/17/24   Court Dorn PARAS, MD    Allergies: Tegretol [carbamazepine]    Review of Systems  Constitutional:  Negative for chills and fever.  HENT:  Negative for ear pain and sore throat.   Eyes:  Negative for pain and visual disturbance.  Respiratory:  Negative for cough and shortness of breath.   Cardiovascular:  Positive for palpitations. Negative for chest pain.  Gastrointestinal:  Negative for abdominal pain and vomiting.  Genitourinary:  Negative for dysuria and hematuria.  Musculoskeletal:  Negative for arthralgias and back pain.  Skin:  Negative for color change and rash.  Neurological:  Negative for seizures and syncope.  All other systems reviewed and are negative.   Updated Vital Signs BP 136/85   Pulse (!) 125   Temp 98.1 F (36.7 C) (Oral)   Resp 13   Ht 5' 5 (1.651 m)   Wt 75.8 kg   SpO2  98%   BMI 27.79 kg/m   Physical Exam Vitals and nursing note reviewed.  Constitutional:      General: She is not in acute distress.    Appearance: Normal appearance. She is well-developed. She is not ill-appearing.  HENT:     Head: Normocephalic and atraumatic.  Eyes:     Conjunctiva/sclera: Conjunctivae normal.  Cardiovascular:     Rate and Rhythm: Regular rhythm. Tachycardia present.     Heart sounds: No murmur heard. Pulmonary:     Effort: Pulmonary effort is normal. No respiratory distress.     Breath sounds: Normal breath sounds.  Abdominal:     Palpations: Abdomen is soft.     Tenderness: There is no abdominal tenderness.  Musculoskeletal:        General: No swelling.      Cervical back: Neck supple.  Skin:    General: Skin is warm and dry.     Capillary Refill: Capillary refill takes less than 2 seconds.  Neurological:     Mental Status: She is alert.  Psychiatric:        Mood and Affect: Mood normal.     (all labs ordered are listed, but only abnormal results are displayed) Labs Reviewed  BASIC METABOLIC PANEL WITH GFR - Abnormal; Notable for the following components:      Result Value   Glucose, Bld 105 (*)    All other components within normal limits  CBC WITH DIFFERENTIAL/PLATELET - Abnormal; Notable for the following components:   Hemoglobin 11.9 (*)    All other components within normal limits  PROTIME-INR - Abnormal; Notable for the following components:   Prothrombin Time 23.7 (*)    INR 2.0 (*)    All other components within normal limits  I-STAT CHEM 8, ED - Abnormal; Notable for the following components:   Glucose, Bld 102 (*)    All other components within normal limits  TROPONIN I (HIGH SENSITIVITY) - Abnormal; Notable for the following components:   Troponin I (High Sensitivity) 38 (*)    All other components within normal limits  TROPONIN I (HIGH SENSITIVITY) - Abnormal; Notable for the following components:   Troponin I (High Sensitivity) 40 (*)    All other components within normal limits  MAGNESIUM  PROTIME-INR  TSH    EKG: None  Radiology: CT Angio Chest Pulmonary Embolism (PE) W or WO Contrast Result Date: 08/21/2024 CLINICAL DATA:  Tachycardia, history of pulmonary embolus EXAM: CT ANGIOGRAPHY CHEST WITH CONTRAST TECHNIQUE: Multidetector CT imaging of the chest was performed using the standard protocol during bolus administration of intravenous contrast. Multiplanar CT image reconstructions and MIPs were obtained to evaluate the vascular anatomy. RADIATION DOSE REDUCTION: This exam was performed according to the departmental dose-optimization program which includes automated exposure control, adjustment of the mA and/or  kV according to patient size and/or use of iterative reconstruction technique. CONTRAST:  75mL OMNIPAQUE  IOHEXOL  350 MG/ML SOLN COMPARISON:  07/24/2013, 03/08/2009 FINDINGS: Cardiovascular: This is a technically adequate evaluation of the pulmonary vasculature. No filling defects or pulmonary emboli. The heart is unremarkable without pericardial effusion. No evidence of thoracic aortic aneurysm or dissection. Atherosclerosis of the aorta and coronary vasculature. Calcifications of the mitral and aortic valves. Mediastinum/Nodes: No enlarged mediastinal, hilar, or axillary lymph nodes. Thyroid  gland, trachea, and esophagus demonstrate no significant findings. Lungs/Pleura: Bibasilar subsegmental atelectasis or scarring. No acute airspace disease, effusion, or pneumothorax. Central airways are patent. Upper Abdomen: No acute abnormality. Musculoskeletal: No acute or destructive bony abnormalities.  Reconstructed images demonstrate no additional findings. Review of the MIP images confirms the above findings. IMPRESSION: 1. No evidence of pulmonary embolus. 2. No acute intrathoracic process. 3. Aortic Atherosclerosis (ICD10-I70.0). Coronary artery atherosclerosis. Electronically Signed   By: Ozell Daring M.D.   On: 08/21/2024 20:40     Procedures   Medications Ordered in the ED  Warfarin - Pharmacist Dosing Inpatient (has no administration in time range)  mirtazapine  (REMERON ) tablet 7.5 mg (has no administration in time range)  diltiazem  (CARDIZEM ) injection 10 mg (10 mg Intravenous Given 08/21/24 1819)  sodium chloride  0.9 % bolus 500 mL (0 mLs Intravenous Stopped 08/21/24 1948)  metoprolol  tartrate (LOPRESSOR ) injection 5 mg (5 mg Intravenous Given 08/21/24 1910)  iohexol  (OMNIPAQUE ) 350 MG/ML injection 75 mL (75 mLs Intravenous Contrast Given 08/21/24 2027)  warfarin (COUMADIN ) tablet 7.5 mg (7.5 mg Oral Given 08/21/24 2130)  acetaminophen  (TYLENOL ) tablet 650 mg (650 mg Oral Given 08/21/24 2130)                                     Medical Decision Making Cardiac monitor interpretation: Sinus tachycardia/a flutter with rates between 120-140, no ectopy  Patient here for palpitations shortness of breath and lightheadedness, had a heart rate of 1 60-200s per EMS.  They gave her a Cardizem  bolus and her heart rate improved to 130-140.  Patient given multiple doses of Cardizem  bolus here as well as a Lopressor  bolus with no improvement in her heart rate at all.  Started on a Cardizem  drip and it was titrated up.  Eventually patient did have some improvement in her heart rate back to rate controlled in the 70s to 90s.  Troponins are elevated as well.  Patient has no history of A-fib or abnormal heart rhythm.  PE study is negative.  INR is 2.1 pharmacy, was consulted for Coumadin  orders.  I discussed patient's case with hospitalist the patient will be mated for further workup and management.  Patient and family at bedside are agreeable with the plan.  Problems Addressed: Atrial fibrillation with RVR (HCC): undiagnosed new problem with uncertain prognosis History of pulmonary embolism: chronic illness or injury  Amount and/or Complexity of Data Reviewed External Data Reviewed: notes.    Details: Prior outpatient records reviewed and patient follows with cardiology for PEs.  She is on warfarin and has IVC filter Labs: ordered. Decision-making details documented in ED Course.    Details: Ordered and reviewed by me and troponins are elevated.  Labs fairly unremarkable otherwise Radiology: ordered and independent interpretation performed. Decision-making details documented in ED Course.    Details: Chest x-ray ordered and interpreted by me independently of radiology and shows no acute abnormality in the chest  CT PE study shows no evidence of PE ECG/medicine tests: ordered and independent interpretation performed. Decision-making details documented in ED Course.    Details: Ordered and interpreted by me in the  absence of cardiology and shows a flutter versus sinus tachycardia with a rate in the 130s but no STEMI Discussion of management or test interpretation with external provider(s): Dr. Franky -hospitalist-I spoke with him on the phone regarding the patient and he will admit the patient to the service for further workup and management  Risk OTC drugs. Prescription drug management. Drug therapy requiring intensive monitoring for toxicity. Decision regarding hospitalization. Risk Details: CRITICAL CARE Performed by: Duwaine LITTIE Fusi   Total critical care time: 35 minutes  Critical care time was exclusive of separately billable procedures and treating other patients.  Critical care was necessary to treat or prevent imminent or life-threatening deterioration.  Critical care was time spent personally by me on the following activities: development of treatment plan with patient and/or surrogate as well as nursing, discussions with consultants, evaluation of patient's response to treatment, examination of patient, obtaining history from patient or surrogate, ordering and performing treatments and interventions, ordering and review of laboratory studies, ordering and review of radiographic studies, pulse oximetry and re-evaluation of patient's condition.   Critical Care Total time providing critical care: 35 minutes     Final diagnoses:  Atrial fibrillation with RVR [facility])  History of pulmonary embolism    ED Discharge Orders     None          Gennaro Duwaine CROME, DO 08/21/24 2333    Gennaro Duwaine L, DO 08/21/24 2356

## 2024-08-21 NOTE — Progress Notes (Signed)
 PHARMACY - ANTICOAGULATION CONSULT NOTE  Pharmacy Consult for warfarin Indication: Hx VTE/Afib  Allergies  Allergen Reactions   Tegretol [Carbamazepine] Rash    Patient Measurements: Height: 5' 5 (165.1 cm) Weight: 75.8 kg (167 lb) IBW/kg (Calculated) : 57 HEPARIN DW (KG): 72.6  Vital Signs: Temp: 98.1 F (36.7 C) (09/08 1744) Temp Source: Oral (09/08 1744) BP: 136/85 (09/08 1945) Pulse Rate: 125 (09/08 1945)  Labs: Recent Labs    08/21/24 1830 08/21/24 1839  HGB 11.9* 12.9  HCT 37.7 38.0  PLT 298  --   LABPROT 23.7*  --   INR 2.0*  --   CREATININE 0.73 0.80  TROPONINIHS 38*  --     Estimated Creatinine Clearance: 60 mL/min (by C-G formula based on SCr of 0.8 mg/dL).   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, but unable to be removed, being treated by Dr Darcy at [facility])    History of echocardiogram 03/11/09   nl EF 60-65%   History of recurrent UTIs    feels like may have one now   PE (pulmonary embolism) 2005   Peripheral vascular disease (HCC)    dvt leg rt   Seizures (HCC)     Assessment: 52 YOF presenting with afib, hx of same and VTE on warfarin PTA with last dose 9/7, INR low end therapeutic on admission at 2  PTA dosing: 7.5mg  daily except 3.75mg  on Sun/Sat  Goal of Therapy:  INR 2-3 Monitor platelets by anticoagulation protocol: Yes   Plan:  Warfarin 7.5mg  PO x 1 tonight Daily INR, s/s bleeding  Dorn Poot, PharmD, [facility] Clinical Pharmacist ED Pharmacist Phone # [phone] 08/21/2024 9:01 PM

## 2024-08-21 NOTE — ED Triage Notes (Signed)
 Pt bib EMS from home for new onset a-fib with RVR. Pt states she felt her heart racing today and her apple watch notified her of a high heart rate. She states she would feel a little dizzy and short of breath when she stood up and walking. On EMS arrival pt HR was 160-220s. No hx of afib. 10mg  cardizem  given IV. 400 mL NS. Pt is on coumadin  for Hx PE, DVT.  140, 140/90, 97% RA, 20 L hand

## 2024-08-22 ENCOUNTER — Observation Stay (HOSPITAL_BASED_OUTPATIENT_CLINIC_OR_DEPARTMENT_OTHER)

## 2024-08-22 ENCOUNTER — Encounter (HOSPITAL_COMMUNITY): Payer: Self-pay | Admitting: Internal Medicine

## 2024-08-22 DIAGNOSIS — I4891 Unspecified atrial fibrillation: Secondary | ICD-10-CM

## 2024-08-22 DIAGNOSIS — R7989 Other specified abnormal findings of blood chemistry: Secondary | ICD-10-CM | POA: Insufficient documentation

## 2024-08-22 HISTORY — DX: Other specified abnormal findings of blood chemistry: R79.89

## 2024-08-22 LAB — CBC
HCT: 34 % — ABNORMAL LOW (ref 36.0–46.0)
Hemoglobin: 10.8 g/dL — ABNORMAL LOW (ref 12.0–15.0)
MCH: 27.6 pg (ref 26.0–34.0)
MCHC: 31.8 g/dL (ref 30.0–36.0)
MCV: 87 fL (ref 80.0–100.0)
Platelets: 286 K/uL (ref 150–400)
RBC: 3.91 MIL/uL (ref 3.87–5.11)
RDW: 14.8 % (ref 11.5–15.5)
WBC: 8.6 K/uL (ref 4.0–10.5)
nRBC: 0 % (ref 0.0–0.2)

## 2024-08-22 LAB — BASIC METABOLIC PANEL WITH GFR
Anion gap: 11 (ref 5–15)
BUN: 19 mg/dL (ref 8–23)
CO2: 23 mmol/L (ref 22–32)
Calcium: 9 mg/dL (ref 8.9–10.3)
Chloride: 107 mmol/L (ref 98–111)
Creatinine, Ser: 0.7 mg/dL (ref 0.44–1.00)
GFR, Estimated: >60 mL/min (ref >60)
Glucose, Bld: 93 mg/dL (ref 70–99)
Potassium: 3.8 mmol/L (ref 3.5–5.1)
Sodium: 141 mmol/L (ref 135–145)

## 2024-08-22 LAB — ECHOCARDIOGRAM COMPLETE
Area-P 1/2: 4.06 cm2
Height: 65 in
S' Lateral: 3.2 cm
Single Plane A4C EF: 51.7 %
Weight: 2672 [oz_av]

## 2024-08-22 LAB — PROTIME-INR
INR: 2 — ABNORMAL HIGH (ref 0.8–1.2)
Prothrombin Time: 23.4 s — ABNORMAL HIGH (ref 11.4–15.2)

## 2024-08-22 LAB — TSH: TSH: 3.881 u[IU]/mL (ref 0.350–4.500)

## 2024-08-22 MED ORDER — ACETAMINOPHEN 325 MG PO TABS: 650.0000 mg | ORAL_TABLET | Freq: Four times a day (QID) | ORAL | Status: DC | PRN

## 2024-08-22 MED ORDER — WARFARIN SODIUM 7.5 MG PO TABS
7.5000 mg | ORAL_TABLET | Freq: Once | ORAL | Status: AC
  Administered 2024-08-22: 7.5 mg via ORAL
  Filled 2024-08-22 (×2): qty 1

## 2024-08-22 MED ORDER — LAMOTRIGINE 100 MG PO TABS
250.0000 mg | ORAL_TABLET | Freq: Two times a day (BID) | ORAL | Status: DC
  Administered 2024-08-22 (×2): 250 mg via ORAL
  Filled 2024-08-22 (×4): qty 2.5
  Filled 2024-08-22: qty 3
  Filled 2024-08-22: qty 10

## 2024-08-22 MED ORDER — ACETAMINOPHEN 650 MG RE SUPP: 650.0000 mg | Freq: Four times a day (QID) | RECTAL | Status: DC | PRN

## 2024-08-22 NOTE — Progress Notes (Signed)
 PHARMACY - ANTICOAGULATION CONSULT NOTE  Pharmacy Consult for warfarin Indication: Hx VTE/Afib  Allergies  Allergen Reactions   Tegretol [Carbamazepine] Rash    Patient Measurements: Height: 5' 5 (165.1 cm) Weight: 75.8 kg (167 lb) IBW/kg (Calculated) : 57 HEPARIN DW (KG): 72.6  Vital Signs: Temp: 98.6 F (37 C) (09/09 0906) Temp Source: Axillary (09/09 0906) BP: 140/65 (09/09 1300) Pulse Rate: 80 (09/09 1300)  Labs: Recent Labs    08/21/24 1830 08/21/24 1839 08/21/24 2115 08/22/24 0508 08/22/24 0814  HGB 11.9* 12.9  --  10.8*  --   HCT 37.7 38.0  --  34.0*  --   PLT 298  --   --  286  --   LABPROT 23.7*  --   --   --  23.4*  INR 2.0*  --   --   --  2.0*  CREATININE 0.73 0.80  --  0.70  --   TROPONINIHS 38*  --  40*  --   --     Estimated Creatinine Clearance: 60 mL/min (by C-G formula based on SCr of 0.7 mg/dL).   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 Today the INR remains 2.0.   PTA dosing: 7.5mg  daily except 3.75mg  on Sun/Sat  Goal of Therapy:  INR 2-3 Monitor platelets by anticoagulation protocol: Yes   Plan:  Warfarin 7.5mg  PO x 1 tonight Daily INR, s/s bleeding  Koren Or, PharmD Clinical Pharmacist 08/22/2024 2:04 PM Please check AMION for all [facility] Pharmacy numbers

## 2024-08-22 NOTE — ED Notes (Signed)
 Attempted to notify floor pt being transported up, no answer

## 2024-08-22 NOTE — Progress Notes (Signed)
 Admission questions completed; RN physical assessments left to complete.

## 2024-08-22 NOTE — TOC CM/SW Note (Addendum)
 Transition of Care [facility]) - Inpatient Brief Assessment   Patient Details  Name: Evelyn [name] MRN: [id number] Date of Birth: May 01, 1946  Transition of Care [facility]) CM/SW Contact:    Evelyn Barnie Rama, RN Phone Number: 08/22/2024, 4:17 PM   Clinical Narrative: From home with spouse, has PCP Evelyn [name]. [employer]  and insurance on file, states has no HH services in place at this time , has rollator at home.  States family member  (spouse) will transport them home at [employer] and family is support system, states gets medications from [facility] in [place].Evelyn [name] self ambulatory.   There are no ICM (Inpatient Case Management)  needs identified  at this time.  Please place consult for any  ICM (Inpatient Case Management)  needs.     Transition of Care Asessment: Insurance and Status: Insurance coverage has been reviewed Patient has primary care physician: Yes Home environment has been reviewed: home with spouse Prior level of function:: indep Prior/Current Home Services: Current home services (rollator)   Readmission risk has been reviewed: Yes Transition of care needs: no transition of care needs at this time

## 2024-08-22 NOTE — Progress Notes (Signed)
  Echocardiogram 2D Echocardiogram has been performed.  Juliene JINNY Rucks 08/22/2024, 1:30 PM

## 2024-08-24 NOTE — Discharge Summary (Signed)
 Physician Discharge Summary   Patient: Evelyn [name] MRN: [id number] DOB: [birth year]  Admit date:     08/21/2024  Discharge date: 08/22/2024  Discharge Physician: Yetta Blanch  PCP: Nancey Eulas BRAVO, MD  Recommendations at discharge: Follow-up with cardiology as recommended. Follow-up with A-fib clinic.   Follow-up Information     Mealor, Eulas BRAVO, MD. Schedule an appointment as soon as possible for a visit in 1 week(s).   Specialty: Cardiology Contact information: [address] [place] [address] [phone]         Atrial Fib Clinic at [facility] A Dept of [facility] [employer]. Schedule an appointment as soon as possible for a visit in 2 week(s).   Specialty: Cardiology Contact information: [address] [place]  [address] [phone]                Hospital Course: Evelyn [name] is a 78 y.o. 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  Patient states that this evening she was at home and she started having palpitations.  Heart rate went up to 150 to 160/min.  Had mild shortness of breath denies any chest pain.  Denies any recent change in medications.   The patient presented in the ED with complaints of palpitation. Received IV Cardizem  in the ED. Also received IV metoprolol . Patient spontaneously converted back to sinus rhythm. Patient has been on Coumadin  chronically for DVT. Patient has prior history of IVC filter as well which was not retrievable when attempted to retrieve in the past. Patient currently asymptomatic. No inciting event identified. EKG reports atrial flutter although I am not able to see any flutter waves. Will refer the patient to A-fib clinic outpatient. Echocardiogram shows preserved EF.  No significant  valvular abnormality. Continuing home medication.  Given her normal heart rate at present no indication to initiate rate controlling medications.  Consultants:  None admitting provider discussed with cardiology.  Procedures performed:  Echocardiogram  DISCHARGE MEDICATION: Allergies as of 08/22/2024       Reactions   Tegretol [carbamazepine] Rash        Medication List     TAKE these medications    acetaminophen  500 MG tablet Commonly known as: TYLENOL  Take 500 mg by mouth every 6 (six) hours as needed.   ascorbic acid 1000 MG tablet Commonly known as: VITAMIN C Take 1,000 mg by mouth daily.   B-12 2000 MCG Tabs Take 1 tablet by mouth daily.   Biotin 1 MG Caps Take 1 mg by mouth daily.   CALCIUM CITRATE + D PO Take 1 tablet by mouth 2 (two) times daily with a meal.   cetirizine 10 MG tablet Commonly known as: ZYRTEC Take 10 mg by mouth daily.   Coenzyme Q10 50 MG Caps Take 1 capsule by mouth every morning.   lamoTRIgine  100 MG tablet Commonly known as: LAMICTAL  Take 0.5 tablets (50 mg total) by mouth 2 (two) times daily.   lamoTRIgine  200 MG tablet Commonly known as: LAMICTAL  Take 1 tablet (200 mg total) by mouth 2 (two) times daily.   mirtazapine  7.5 MG tablet Commonly known as: REMERON  Take 3.75-7.5 mg by mouth at bedtime.   multivitamin with minerals Tabs tablet Take 1 tablet by mouth daily.   Omega 3 1000 MG Caps Take  1 capsule by mouth every morning.   Pataday 0.7 % Soln Generic drug: Olopatadine HCl Place 1 drop into both eyes daily as needed (allergies).   Systane 0.4-0.3 % Soln Generic drug: Polyethyl Glycol-Propyl Glycol Place 1 drop into both eyes every morning.   warfarin 7.5 MG tablet Commonly known as: COUMADIN  Take as directed. If you are unsure how to take this medication, talk to your nurse or doctor. Original instructions: TAKE 1 TABLET BY MOUTH DAILY AS DIRECTED BY COAGULATION CLINIC What changed: See the new instructions.        Disposition: Home Diet recommendation: Cardiac diet  Discharge Exam: Vitals:   08/22/24 1000 08/22/24 1300 08/22/24 1440 08/22/24 1447  BP: 130/61 (!) 140/65  (!) 154/63  Pulse: 72 80  61  Resp: 20 14  20   Temp:    98.3 F (36.8 C)  TempSrc:    Oral  SpO2: 97% 99%  100%  Weight:   74.9 kg   Height:   5' 5 (1.651 m)    Clear to auscultation. S1-S2 present. Bowel sound present No edema. Filed Weights   08/21/24 1745 08/22/24 1440  Weight: 75.8 kg 74.9 kg   Condition at discharge: stable  The results of significant diagnostics from this hospitalization (including imaging, microbiology, ancillary and laboratory) are listed below for reference.   Imaging Studies: ECHOCARDIOGRAM COMPLETE Result Date: 08/22/2024    ECHOCARDIOGRAM REPORT   Patient Name:   JENEFER [name] Date of Exam: 08/22/2024 Medical Rec #:  [id number]        Height:       65.0 in Accession #:    [id number]       Weight:       167.0 lb Date of Birth:  08/19/1946       BSA:          1.832 m Patient Age:    78 years         BP:           108/56 mmHg Patient Gender: F                HR:           80 bpm. Exam Location:  Inpatient Procedure: 2D Echo, Cardiac Doppler and Color Doppler (Both Spectral and Color            Flow Doppler were utilized during procedure). Indications:    A-Fib  History:        Patient has no prior history of Echocardiogram examinations.                 Arrythmias:Atrial Fibrillation; Risk Factors:Non-Smoker.  Sonographer:    Juliene Rucks Referring Phys: REDIA LOISE CLEAVER  Sonographer Comments: Technically challenging study due to limited acoustic windows. IMPRESSIONS  1. Left ventricular ejection fraction, by estimation, is 60 to 65%. The left ventricle has normal function. The left ventricle has no regional wall motion abnormalities. Left ventricular diastolic parameters were normal.  2. Right ventricular systolic function is normal. The right ventricular size is normal. There is normal  pulmonary artery systolic pressure. The estimated right ventricular systolic pressure is 21.3 mmHg.  3. The tips of both papillary muscles are heavily calcified. The mitral valve is degenerative. Mild to moderate mitral valve regurgitation. Moderate mitral annular calcification.  4. The aortic valve is tricuspid. There is mild calcification of the aortic valve. Aortic valve regurgitation is not visualized. Aortic valve sclerosis/calcification is present, without any evidence of aortic stenosis.  5. The inferior vena cava is normal in size with greater than 50% respiratory variability, suggesting right atrial pressure of 3 mmHg. FINDINGS  Left Ventricle: Left ventricular ejection fraction, by estimation, is 60 to 65%. The left ventricle has normal function. The left ventricle has no regional wall motion abnormalities. The left ventricular internal cavity size was normal in size. There is  no left ventricular hypertrophy. Left ventricular diastolic parameters were normal. Right Ventricle: The right ventricular size is normal. No increase in right ventricular wall thickness. Right ventricular systolic function is normal. There is normal pulmonary artery systolic pressure. The tricuspid regurgitant velocity is 2.14 m/s, and  with an assumed right atrial pressure of 3 mmHg, the estimated right ventricular systolic pressure is 21.3 mmHg. Left Atrium: Left atrial size was normal in size. Right Atrium: Right atrial size was normal in size. Pericardium: There is no evidence of pericardial effusion. Mitral Valve: The tips of both papillary muscles are heavily calcified. The mitral valve is degenerative in appearance. Moderate mitral annular calcification. Mild to moderate mitral valve regurgitation, with posteriorly-directed jet. Tricuspid Valve: The tricuspid valve is normal in structure. Tricuspid valve regurgitation is trivial. Aortic Valve: The aortic valve is tricuspid. There is mild calcification of the aortic valve.  Aortic valve regurgitation is not visualized. Aortic valve sclerosis/calcification is present, without any evidence of aortic stenosis. Pulmonic Valve: The pulmonic valve was not well visualized. Pulmonic valve regurgitation is not visualized. No evidence of pulmonic stenosis. Aorta: The aortic root is normal in size and structure. Venous: The inferior vena cava is normal in size with greater than 50% respiratory variability, suggesting right atrial pressure of 3 mmHg. IAS/Shunts: No atrial level shunt detected by color flow Doppler.  LEFT VENTRICLE PLAX 2D LVIDd:         4.40 cm      Diastology LVIDs:         3.20 cm      LV e' medial:    7.65 cm/s LV PW:         0.90 cm      LV E/e' medial:  14.0 LV IVS:        0.90 cm      LV e' lateral:   10.30 cm/s LVOT diam:     2.00 cm      LV E/e' lateral: 10.4 LV SV:         44 LV SV Index:   24 LVOT Area:     3.14 cm  LV Volumes (MOD) LV vol d, MOD A4C: 100.0 ml LV vol s, MOD A4C: 48.3 ml LV SV MOD A4C:     100.0 ml RIGHT VENTRICLE             IVC RV Basal diam:  3.00 cm     IVC diam: 2.00 cm RV Mid diam:    2.80 cm RV S prime:     14.50 cm/s TAPSE (M-mode): 1.3 cm LEFT ATRIUM           Index        RIGHT ATRIUM           Index LA diam:      3.20 cm 1.75 cm/m   RA Area:     11.50 cm LA Vol (A4C): 33.1 ml 18.07 ml/m  RA Volume:   25.70 ml  14.03 ml/m  AORTIC VALVE LVOT Vmax:   76.00 cm/s LVOT Vmean:  48.200 cm/s LVOT VTI:    0.140 m  AORTA  Ao Root diam: 2.10 cm MITRAL VALVE                TRICUSPID VALVE MV Area (PHT): 4.06 cm     TR Peak grad:   18.3 mmHg MV Decel Time: 187 msec     TR Vmax:        214.00 cm/s MV E velocity: 107.00 cm/s MV A velocity: 89.60 cm/s   SHUNTS MV E/A ratio:  1.19         Systemic VTI:  0.14 m                             Systemic Diam: 2.00 cm Jerel Croitoru MD Electronically signed by Jerel Balding MD Signature Date/Time: 08/22/2024/2:46:16 PM    Final    CT Angio Chest Pulmonary Embolism (PE) W or WO Contrast Result Date:  08/21/2024 CLINICAL DATA:  Tachycardia, history of pulmonary embolus EXAM: CT ANGIOGRAPHY CHEST WITH CONTRAST TECHNIQUE: Multidetector CT imaging of the chest was performed using the standard protocol during bolus administration of intravenous contrast. Multiplanar CT image reconstructions and MIPs were obtained to evaluate the vascular anatomy. RADIATION DOSE REDUCTION: This exam was performed according to the departmental dose-optimization program which includes automated exposure control, adjustment of the mA and/or kV according to patient size and/or use of iterative reconstruction technique. CONTRAST:  75mL OMNIPAQUE  IOHEXOL  350 MG/ML SOLN COMPARISON:  07/24/2013, 03/08/2009 FINDINGS: Cardiovascular: This is a technically adequate evaluation of the pulmonary vasculature. No filling defects or pulmonary emboli. The heart is unremarkable without pericardial effusion. No evidence of thoracic aortic aneurysm or dissection. Atherosclerosis of the aorta and coronary vasculature. Calcifications of the mitral and aortic valves. Mediastinum/Nodes: No enlarged mediastinal, hilar, or axillary lymph nodes. Thyroid  gland, trachea, and esophagus demonstrate no significant findings. Lungs/Pleura: Bibasilar subsegmental atelectasis or scarring. No acute airspace disease, effusion, or pneumothorax. Central airways are patent. Upper Abdomen: No acute abnormality. Musculoskeletal: No acute or destructive bony abnormalities. Reconstructed images demonstrate no additional findings. Review of the MIP images confirms the above findings. IMPRESSION: 1. No evidence of pulmonary embolus. 2. No acute intrathoracic process. 3. Aortic Atherosclerosis (ICD10-I70.0). Coronary artery atherosclerosis. Electronically Signed   By: Ozell Daring M.D.   On: 08/21/2024 20:40    Microbiology: Results for orders placed or performed during the hospital encounter of 08/01/21  SARS CORONAVIRUS 2 (TAT 6-24 HRS) Nasopharyngeal Nasopharyngeal Swab      Status: None   Collection Time: 08/01/21  6:27 PM   Specimen: Nasopharyngeal Swab  Result Value Ref Range Status   SARS Coronavirus 2 NEGATIVE NEGATIVE Final    Comment: (NOTE) SARS-CoV-2 target nucleic acids are NOT DETECTED.  The SARS-CoV-2 RNA is generally detectable in upper and lower respiratory specimens during the acute phase of infection. Negative results do not preclude SARS-CoV-2 infection, do not rule out co-infections with other pathogens, and should not be used as the sole basis for treatment or other patient management decisions. Negative results must be combined with clinical observations, patient history, and epidemiological information. The expected result is Negative.  Fact Sheet for Patients: HairSlick.no  Fact Sheet for Healthcare Providers: quierodirigir.com  This test is not yet approved or cleared by the [place]  FDA and  has been authorized for detection and/or diagnosis of SARS-CoV-2 by FDA under an Emergency Use Authorization (EUA). This EUA will remain  in effect (meaning this test can be used) for the duration of the COVID-19 declaration under Se ction  564(b)(1) of the Act, 21 U.S.C. section 360bbb-3(b)(1), unless the authorization is terminated or revoked sooner.  Performed at [facility], [address], [place], [place] [address]    Labs: CBC: Recent Labs  Lab 08/21/24 1830 08/21/24 1839 08/22/24 0508  WBC 8.5  --  8.6  NEUTROABS 5.6  --   --   HGB 11.9* 12.9 10.8*  HCT 37.7 38.0 34.0*  MCV 87.5  --  87.0  PLT 298  --  286   Basic Metabolic Panel: Recent Labs  Lab 08/21/24 1830 08/21/24 1839 08/22/24 0508  NA 139 141 141  K 4.6 4.7 3.8  CL 105 108 107  CO2 22  --  23  GLUCOSE 105* 102* 93  BUN 20 23 19   CREATININE 0.73 0.80 0.70  CALCIUM 9.2  --  9.0  MG 2.0  --   --    Liver Function Tests: No results for input(s): AST, ALT, ALKPHOS, BILITOT,  PROT, ALBUMIN in the last 168 hours. CBG: No results for input(s): GLUCAP in the last 168 hours.  Discharge time spent: greater than 30 minutes.  Author: Yetta Blanch, MD  [facility] 08/22/2024

## 2024-08-28 ENCOUNTER — Encounter: Payer: Self-pay | Admitting: *Deleted

## 2024-08-30 ENCOUNTER — Encounter: Payer: Self-pay | Admitting: *Deleted

## 2024-08-31 ENCOUNTER — Encounter: Payer: Self-pay | Admitting: Emergency Medicine

## 2024-08-31 ENCOUNTER — Ambulatory Visit: Attending: Emergency Medicine | Admitting: Emergency Medicine

## 2024-08-31 VITALS — BP 130/60 | HR 78 | Ht 65.0 in | Wt 166.0 lb

## 2024-08-31 DIAGNOSIS — Z86711 Personal history of pulmonary embolism: Secondary | ICD-10-CM | POA: Diagnosis not present

## 2024-08-31 DIAGNOSIS — Z7901 Long term (current) use of anticoagulants: Secondary | ICD-10-CM | POA: Diagnosis not present

## 2024-08-31 DIAGNOSIS — I480 Paroxysmal atrial fibrillation: Secondary | ICD-10-CM | POA: Diagnosis not present

## 2024-08-31 DIAGNOSIS — I340 Nonrheumatic mitral (valve) insufficiency: Secondary | ICD-10-CM

## 2024-08-31 DIAGNOSIS — Z86718 Personal history of other venous thrombosis and embolism: Secondary | ICD-10-CM

## 2024-08-31 DIAGNOSIS — R296 Repeated falls: Secondary | ICD-10-CM

## 2024-08-31 NOTE — Progress Notes (Signed)
 Cardiology Office Note:    Date:  08/31/2024  ID:  [name], DOB 1946/07/06, MRN [id number] PCP: Nancey Eulas BRAVO, MD  [facility] Providers Cardiologist:  Dorn Lesches, MD       Patient Profile:       Chief Complaint: Hospital follow-up History of Present Illness:  Evelyn [name] is a 78 y.o. female with visit-pertinent history of PE/bilateral DVT s/p IVC filter on Coumadin , recurrent falls, recurrent UTIs, seizures  Patient previously followed by Dr. Anner.  She has since followed with Dr. Lesches.  She was seen in office on 07/05/2020 and was stable from a cardiac standpoint.  She was lost to follow-up.  She was evaluated in the ED in July 2023 in the setting of mechanical fall.  CT head/C-spine/maxillofacial showed no acute intracranial hemorrhage, no acute fracture.  She was discharged in stable condition.  She follow back up in clinic on 08/28/2022 and has not followed back up in office since.  She was doing well without acute cardiovascular complaints.  She reported to continue to have issues with balance and multiple falls.  She was to be evaluated by neurology.  She was to follow-up in 1 year.  Patient was admitted to the hospital 08/21/2024 and discharged on 08/22/2024.  She had presented to the hospital because her Apple Watch notified her that she was having an elevated heart rate.  She reported she was at home and started having palpitations and heart rate went up to 150-160 bpm.  She reported mild shortness of breath but denied any chest pains.  She received IV Cardizem  in the ED as well as IV metoprolol .  She spontaneously converted back to sinus rhythm.  Her initial EKG reported possible atrial flutter however did not seem to be any flutter waves.  She underwent echocardiogram on 9/9 showing LVEF 60 to 65%, no RWMA, normal diastolic parameters, RV function and size normal, normal PASP, mild to moderate mitral valve regurgitation, moderate mitral annular  calcification, mild calcification of aortic valve.   Discussed the use of AI scribe software for clinical note transcription with the patient, who gave verbal consent to proceed.  History of Present Illness Evelyn [name] is a 78 year old female who presents with a recent episode of fast heart rates and possible atrial flutter.  She is accompanied by her husband.  Today patient is doing well overall.  She is without acute cardiovascular concerns today.  Since her admission she has been asymptomatic regarding palpitations or chest fluttering.  She denies any palpitations or any symptoms concerning for recurrent arrhythmia.  She is on Coumadin  for anticoagulation due to previous pulmonary embolism and deep vein thrombosis. She has not experienced leg pain or dyspnea recently.  She has a history of balance issues, diagnosed with large fiber neuropathy, and has undergone neurological evaluations, including MRIs and inner ear tests, without a clear cause identified. Her balance does continue to worsen. She abstains from alcohol, smoking, drugs, and caffeine, with no recent dietary changes.  She is without any chest pains, dyspnea, orthopnea, PND, LEE, syncope, presyncope, lightheadedness, dizziness.  Review of systems:  Please see the history of present illness. All other systems are reviewed and otherwise negative.      Studies Reviewed:    EKG Interpretation Date/Time:  Thursday August 31 2024 09:46:39 EDT Ventricular Rate:  78 PR Interval:  154 QRS Duration:  76 QT Interval:  390 QTC Calculation: 444 R Axis:   -8  Text  Interpretation: Sinus rhythm with Premature atrial complexes When compared with ECG of 21-Aug-2024 18:37, PREVIOUS ECG IS PRESENT Confirmed by Rana Dixon [id number]) on 08/31/2024 7:28:33 PM    Echocardiogram 08/22/2024  1. Left ventricular ejection fraction, by estimation, is 60 to 65%. The  left ventricle has normal function. The left ventricle has no  regional  wall motion abnormalities. Left ventricular diastolic parameters were  normal.   2. Right ventricular systolic function is normal. The right ventricular  size is normal. There is normal pulmonary artery systolic pressure. The  estimated right ventricular systolic pressure is 21.3 mmHg.   3. The tips of both papillary muscles are heavily calcified. The mitral  valve is degenerative. Mild to moderate mitral valve regurgitation.  Moderate mitral annular calcification.   4. The aortic valve is tricuspid. There is mild calcification of the  aortic valve. Aortic valve regurgitation is not visualized. Aortic valve  sclerosis/calcification is present, without any evidence of aortic  stenosis.   5. The inferior vena cava is normal in size with greater than 50%  respiratory variability, suggesting right atrial pressure of 3 mmHg.    Risk Assessment/Calculations:    CHA2DS2-VASc Score = 3   This indicates a 3.2% annual risk of stroke. The patient's score is based upon: CHF History: 0 HTN History: 0 Diabetes History: 0 Stroke History: 0 Vascular Disease History: 0 Age Score: 2 Gender Score: 1             Physical Exam:   VS:  BP 130/60 (BP Location: Left Arm, Patient Position: Sitting, Cuff Size: Normal)   Pulse 78   Ht 5' 5 (1.651 m)   Wt 166 lb (75.3 kg)   BMI 27.62 kg/m    Wt Readings from Last 3 Encounters:  08/31/24 166 lb (75.3 kg)  08/22/24 165 lb 2 oz (74.9 kg)  03/23/24 155 lb (70.3 kg)    GEN: Well nourished, well developed in no acute distress NECK: No JVD; No carotid bruits CARDIAC: RRR, no murmurs, rubs, gallops RESPIRATORY:  Clear to auscultation without rales, wheezing or rhonchi  ABDOMEN: Soft, non-tender, non-distended EXTREMITIES:  No edema; No acute deformity      Assessment and Plan:  Atrial flutter Patient admitted on 9/8 with possible atrial flutter with RVR.  Initial heart rates up to 150-160 bpm.  Received IV Cardizem  and IV metoprolol   and converted to NSR.  Echocardiogram 08/2024 with normal LVEF and no significant valvular abnormalities - EKG today shows patient is maintaining sinus rhythm - She denies any further episodes of chest fluttering, palpitations, or tachycardia - She establishes with A-fib clinic on 9/24.  Will defer workup  - She is on anticoagulation for prior history of PE - Educated on potential triggers  Mild to moderate mitral valve regurgitation Echocardiogram 08/2024 shows the tips of both papillary muscles are heavily calcified with degenerative mitral valve.  Mild to moderate mitral valve regurgitation with moderate MAC - Remains asymptomatic.  No indication for intervention at this time  - Plan to repeat echocardiogram x 1 year for routine monitoring  History of PE/DVT S/p IVC filter - Today she denies any bleeding.  She is without dyspnea or leg pains - Continue Coumadin  per Coumadin  clinic  Recurrent falls Continues to have issues with balance possibly affected by her large fiber neuropathy.  No recent falls but does endorse gait instability - No presyncope or syncope      Dispo:  Return in about 4 months (around 12/31/2024).  Signed,  Lum LITTIE Louis, NP

## 2024-08-31 NOTE — Patient Instructions (Addendum)
 Medication Instructions:  NO CHANGES  Lab Work: NONE TO BE DONE TODAY.  Testing/Procedures: NONE  Follow-Up: At [facility], you and your health needs are our priority.  As part of our continuing mission to provide you with exceptional heart care, our providers are all part of one team.  This team includes your primary Cardiologist (physician) and Advanced Practice Providers or APPs (Physician Assistants and Nurse Practitioners) who all work together to provide you with the care you need, when you need it.  Your next appointment:   4 MONTHS KEEP AFIB APPOINTMENT  Provider:   Dorn Lesches, MD OR Lum Louis, NP

## 2024-09-01 ENCOUNTER — Ambulatory Visit

## 2024-09-06 ENCOUNTER — Inpatient Hospital Stay (HOSPITAL_COMMUNITY)
Admission: RE | Admit: 2024-09-06 | Discharge: 2024-09-06 | Disposition: A | Discharge status: Home / Self Care | Source: Ambulatory Visit | Attending: Internal Medicine | Admitting: Internal Medicine

## 2024-09-06 ENCOUNTER — Ambulatory Visit (HOSPITAL_COMMUNITY)
Admission: RE | Admit: 2024-09-06 | Discharge: 2024-09-06 | Disposition: A | Discharge status: Home / Self Care | Source: Ambulatory Visit | Attending: Internal Medicine | Admitting: Internal Medicine

## 2024-09-06 VITALS — BP 132/58 | HR 86 | Ht 65.0 in | Wt 165.8 lb

## 2024-09-06 DIAGNOSIS — I4891 Unspecified atrial fibrillation: Secondary | ICD-10-CM

## 2024-09-06 DIAGNOSIS — D6869 Other thrombophilia: Secondary | ICD-10-CM | POA: Diagnosis not present

## 2024-09-06 DIAGNOSIS — I4892 Unspecified atrial flutter: Secondary | ICD-10-CM

## 2024-09-06 DIAGNOSIS — I480 Paroxysmal atrial fibrillation: Secondary | ICD-10-CM | POA: Diagnosis not present

## 2024-09-06 NOTE — Progress Notes (Signed)
 Primary Care Physician: Mealor, Eulas BRAVO, MD Primary Cardiologist: Dorn Lesches, MD Electrophysiologist: None     Referring Physician: ED     Evelyn [name] is a 78 y.o. 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. Hospital admission 9/8-08/2024 for possible atrial flutter. Patient is on coumadin  for a CHADS2VASC score of 3.  On evaluation today, patient is currently in NSR. She wears an Apple watch that is possible a series 6. There have been no additional elevated HR alerts since hospital discharge. She is on coumadin  for anticoagulation. She snores at times per spouse. She does not have significant caffeine intake or any alcohol. Episode was not precipitated by a clear trigger.  Today, she denies symptoms of chest pain, shortness of breath, orthopnea, PND, lower extremity edema, dizziness, presyncope, syncope, bleeding, or neurologic sequela. The patient is tolerating medications without difficulties and is otherwise without complaint today.    Atrial Fibrillation Risk Factors:  she does have symptoms or diagnosis of sleep apnea.  she has a BMI of Body mass index is 27.59 kg/m.SABRA Filed Weights   09/06/24 1452  Weight: 75.2 kg    Current Outpatient Medications  Medication Sig Dispense Refill   acetaminophen  (TYLENOL ) 500 MG tablet Take 500 mg by mouth every 6 (six) hours as needed. (Patient taking differently: Take 500 mg by mouth as needed.)     ascorbic acid (VITAMIN C) 1000 MG tablet Take 1,000 mg by mouth daily.     Biotin 1 MG CAPS Take 1 mg by mouth daily.     Calcium Citrate-Vitamin D (CALCIUM CITRATE + D PO) Take 1 tablet by mouth 2 (two) times daily with a meal.     cetirizine (ZYRTEC) 10 MG tablet Take 10 mg by mouth daily.     Coenzyme Q10 50 MG CAPS Take 1 capsule by mouth every morning.     Cyanocobalamin (B-12) 2000 MCG TABS  Take 1 tablet by mouth daily.     lamoTRIgine  (LAMICTAL ) 100 MG tablet Take 0.5 tablets (50 mg total) by mouth 2 (two) times daily. 90 tablet 3   lamoTRIgine  (LAMICTAL ) 200 MG tablet Take 1 tablet (200 mg total) by mouth 2 (two) times daily. 180 tablet 3   mirtazapine  (REMERON ) 7.5 MG tablet Take 3.75-7.5 mg by mouth at bedtime.     Multiple Vitamin (MULTIVITAMIN WITH MINERALS) TABS tablet Take 1 tablet by mouth daily.     nitrofurantoin, macrocrystal-monohydrate, (MACROBID) 100 MG capsule Take 100 mg by mouth 2 (two) times daily.     Olopatadine HCl (PATADAY) 0.7 % SOLN Place 1 drop into both eyes daily as needed (allergies).     Omega 3 1000 MG CAPS Take 1 capsule by mouth every morning.     Polyethyl Glycol-Propyl Glycol (SYSTANE) 0.4-0.3 % SOLN Place 1 drop into both eyes every morning.     triamcinolone cream (KENALOG) 0.1 % Apply topically 2 (two) times daily.     warfarin (COUMADIN ) 7.5 MG tablet TAKE 1 TABLET BY MOUTH DAILY AS DIRECTED BY COAGULATION CLINIC 100 tablet 1   No current facility-administered medications for this encounter.    Atrial Fibrillation Management history:  Previous antiarrhythmic drugs: none Previous cardioversions: none Previous ablations: none Anticoagulation history: coumadin    ROS- All systems are reviewed and negative except as per the HPI above.  Physical Exam: BP (!) 132/58   Pulse 86  Ht 5' 5 (1.651 m)   Wt 75.2 kg   BMI 27.59 kg/m   GEN: Well nourished, well developed in no acute distress NECK: No JVD; No carotid bruits CARDIAC: Regular rate and rhythm, no murmurs, rubs, gallops RESPIRATORY:  Clear to auscultation without rales, wheezing or rhonchi  ABDOMEN: Soft, non-tender, non-distended EXTREMITIES:  No edema; No deformity   EKG today demonstrates  Vent. rate 86 BPM PR interval 146 ms QRS duration 76 ms QT/QTcB 374/447 ms P-R-T axes 68 18 62 Normal sinus rhythm Normal ECG When compared with ECG of 31-Aug-2024  09:46, Premature atrial complexes are no longer Present  Echo 08/22/24: 1. Left ventricular ejection fraction, by estimation, is 60 to 65%. The  left ventricle has normal function. The left ventricle has no regional  wall motion abnormalities. Left ventricular diastolic parameters were  normal.   2. Right ventricular systolic function is normal. The right ventricular  size is normal. There is normal pulmonary artery systolic pressure. The  estimated right ventricular systolic pressure is 21.3 mmHg.   3. The tips of both papillary muscles are heavily calcified. The mitral  valve is degenerative. Mild to moderate mitral valve regurgitation.  Moderate mitral annular calcification.   4. The aortic valve is tricuspid. There is mild calcification of the  aortic valve. Aortic valve regurgitation is not visualized. Aortic valve  sclerosis/calcification is present, without any evidence of aortic  stenosis.   5. The inferior vena cava is normal in size with greater than 50%  respiratory variability, suggesting right atrial pressure of 3 mmHg.   ASSESSMENT & PLAN CHA2DS2-VASc Score = 3  The patient's score is based upon: CHF History: 0 HTN History: 0 Diabetes History: 0 Stroke History: 0 Vascular Disease History: 0 Age Score: 2 Gender Score: 1       ASSESSMENT AND PLAN: Paroxysmal Atrial Flutter (ICD10:  I48.0) The patient's CHA2DS2-VASc score is 3, indicating a 3.2% annual risk of stroke.    Patient is currently in NSR. We discussed ECG which possibly shows atrial flutter. Will place 2 week Zio monitor to assess arrhythmia burden. No indication for rate control at this time. Education provided about Afib. Discussion about triggers and medication treatments and ablation going forward if indicated. After discussion, we will proceed with conservative observation at this time. Continue using Apple watch to monitor rhythm.   Secondary Hypercoagulable State (ICD10:  D68.69) The patient is at  significant risk for stroke/thromboembolism based upon her CHA2DS2-VASc Score of 3.  Continue Warfarin (Coumadin ).  Continue coumadin .    Follow up 6 months Afib clinic.    Terra Pac, [facility]  Afib Clinic [address] [place], [place] [address] [phone]

## 2024-09-06 NOTE — Addendum Note (Signed)
 Encounter addended by: Terra Fairy PARAS, PA-C on: 09/06/2024 3:53 PM  Actions taken: Level of Service modified

## 2024-09-06 NOTE — Patient Instructions (Signed)
 Wear monitor for 14 days and remove 09/20/24 then place in mailbox. We will call you for follow up.

## 2024-09-12 ENCOUNTER — Telehealth: Payer: Self-pay | Admitting: Cardiovascular Disease

## 2024-09-12 ENCOUNTER — Ambulatory Visit

## 2024-09-12 NOTE — Telephone Encounter (Signed)
 Patient woke up sick and had to reschedule Coumadin  today for next available on 10/08. She says she is already about 3 weeks overdue and would like to know if she can be worked in a little sooner anywhere. Please advise.

## 2024-09-12 NOTE — Telephone Encounter (Signed)
 Returned call to the patient and she states she will keep the appointment she made fro 09/20/24 at 3330pm.

## 2024-09-12 NOTE — Telephone Encounter (Signed)
 Called patient and patient verbalized she been sick and has appt scheduled with counamdin clinic 10/8 appt. Made patient aware this will be sent to Anticoagulant team for sooner appt.

## 2024-09-20 ENCOUNTER — Ambulatory Visit: Attending: Cardiovascular Disease | Admitting: *Deleted

## 2024-09-20 DIAGNOSIS — I824Y3 Acute embolism and thrombosis of unspecified deep veins of proximal lower extremity, bilateral: Secondary | ICD-10-CM | POA: Diagnosis not present

## 2024-09-20 DIAGNOSIS — I824Y9 Acute embolism and thrombosis of unspecified deep veins of unspecified proximal lower extremity: Secondary | ICD-10-CM

## 2024-09-20 DIAGNOSIS — Z7901 Long term (current) use of anticoagulants: Secondary | ICD-10-CM | POA: Diagnosis not present

## 2024-09-20 DIAGNOSIS — I4891 Unspecified atrial fibrillation: Secondary | ICD-10-CM | POA: Diagnosis not present

## 2024-09-20 LAB — POCT INR: INR: 2 (ref 2.0–3.0)

## 2024-09-20 NOTE — Progress Notes (Signed)
 Description   INR-2.0; Continue taking warfarin 1 tablet daily except 1/2 tablet on Saturdays and Sundays.  Recheck INR in 4 weeks.  Continue with 3 servings of vitamin K foods per week.  Coumadin  Clinic [phone]

## 2024-09-20 NOTE — Patient Instructions (Signed)
 Description   INR-2.0; Continue taking warfarin 1 tablet daily except 1/2 tablet on Saturdays and Sundays.  Recheck INR in 4 weeks.  Continue with 3 servings of vitamin K foods per week.  Coumadin  Clinic [phone]

## 2024-09-25 ENCOUNTER — Ambulatory Visit (HOSPITAL_COMMUNITY): Payer: Self-pay | Admitting: Internal Medicine

## 2024-09-25 NOTE — Addendum Note (Signed)
 Encounter addended by: Janel Nancy SAUNDERS, RN on: 09/25/2024 10:15 AM  Actions taken: Imaging Exam ended

## 2024-10-05 ENCOUNTER — Other Ambulatory Visit: Payer: Self-pay

## 2024-10-05 ENCOUNTER — Emergency Department (HOSPITAL_COMMUNITY)

## 2024-10-05 ENCOUNTER — Encounter (HOSPITAL_COMMUNITY): Payer: Self-pay

## 2024-10-05 ENCOUNTER — Inpatient Hospital Stay (HOSPITAL_COMMUNITY)
Admission: EM | Admit: 2024-10-05 | Discharge: 2024-10-10 | DRG: 481 | Disposition: A | Discharge status: Home Health | Attending: Internal Medicine | Admitting: Internal Medicine

## 2024-10-05 DIAGNOSIS — I10 Essential (primary) hypertension: Secondary | ICD-10-CM | POA: Diagnosis not present

## 2024-10-05 DIAGNOSIS — I4891 Unspecified atrial fibrillation: Secondary | ICD-10-CM | POA: Diagnosis present

## 2024-10-05 DIAGNOSIS — W010XXA Fall on same level from slipping, tripping and stumbling without subsequent striking against object, initial encounter: Secondary | ICD-10-CM | POA: Diagnosis present

## 2024-10-05 DIAGNOSIS — R413 Other amnesia: Secondary | ICD-10-CM | POA: Diagnosis present

## 2024-10-05 DIAGNOSIS — G43909 Migraine, unspecified, not intractable, without status migrainosus: Secondary | ICD-10-CM | POA: Diagnosis present

## 2024-10-05 DIAGNOSIS — Y92000 Kitchen of unspecified non-institutional (private) residence as  the place of occurrence of the external cause: Secondary | ICD-10-CM

## 2024-10-05 DIAGNOSIS — I739 Peripheral vascular disease, unspecified: Secondary | ICD-10-CM | POA: Diagnosis present

## 2024-10-05 DIAGNOSIS — R791 Abnormal coagulation profile: Secondary | ICD-10-CM | POA: Diagnosis present

## 2024-10-05 DIAGNOSIS — Z86711 Personal history of pulmonary embolism: Secondary | ICD-10-CM

## 2024-10-05 DIAGNOSIS — Z9181 History of falling: Secondary | ICD-10-CM

## 2024-10-05 DIAGNOSIS — E878 Other disorders of electrolyte and fluid balance, not elsewhere classified: Secondary | ICD-10-CM | POA: Diagnosis present

## 2024-10-05 DIAGNOSIS — D72829 Elevated white blood cell count, unspecified: Secondary | ICD-10-CM | POA: Diagnosis present

## 2024-10-05 DIAGNOSIS — Z86718 Personal history of other venous thrombosis and embolism: Secondary | ICD-10-CM | POA: Diagnosis not present

## 2024-10-05 DIAGNOSIS — D62 Acute posthemorrhagic anemia: Secondary | ICD-10-CM | POA: Diagnosis not present

## 2024-10-05 DIAGNOSIS — G9389 Other specified disorders of brain: Secondary | ICD-10-CM | POA: Diagnosis present

## 2024-10-05 DIAGNOSIS — Z888 Allergy status to other drugs, medicaments and biological substances status: Secondary | ICD-10-CM

## 2024-10-05 DIAGNOSIS — Z79899 Other long term (current) drug therapy: Secondary | ICD-10-CM

## 2024-10-05 DIAGNOSIS — G40109 Localization-related (focal) (partial) symptomatic epilepsy and epileptic syndromes with simple partial seizures, not intractable, without status epilepticus: Secondary | ICD-10-CM | POA: Diagnosis present

## 2024-10-05 DIAGNOSIS — S72141A Displaced intertrochanteric fracture of right femur, initial encounter for closed fracture: Principal | ICD-10-CM | POA: Diagnosis present

## 2024-10-05 DIAGNOSIS — Z87440 Personal history of urinary (tract) infections: Secondary | ICD-10-CM | POA: Diagnosis not present

## 2024-10-05 DIAGNOSIS — Z7901 Long term (current) use of anticoagulants: Secondary | ICD-10-CM

## 2024-10-05 DIAGNOSIS — M25551 Pain in right hip: Secondary | ICD-10-CM | POA: Diagnosis present

## 2024-10-05 DIAGNOSIS — S72001A Fracture of unspecified part of neck of right femur, initial encounter for closed fracture: Principal | ICD-10-CM

## 2024-10-05 LAB — CBC
HCT: 36 % (ref 36.0–46.0)
Hemoglobin: 11.3 g/dL — ABNORMAL LOW (ref 12.0–15.0)
MCH: 27.8 pg (ref 26.0–34.0)
MCHC: 31.4 g/dL (ref 30.0–36.0)
MCV: 88.7 fL (ref 80.0–100.0)
Platelets: 297 K/uL (ref 150–400)
RBC: 4.06 MIL/uL (ref 3.87–5.11)
RDW: 14.6 % (ref 11.5–15.5)
WBC: 15.7 K/uL — ABNORMAL HIGH (ref 4.0–10.5)
nRBC: 0 % (ref 0.0–0.2)

## 2024-10-05 LAB — I-STAT CHEM 8, ED
BUN: 24 mg/dL — ABNORMAL HIGH (ref 8–23)
Calcium, Ion: 1.12 mmol/L — ABNORMAL LOW (ref 1.15–1.40)
Chloride: 106 mmol/L (ref 98–111)
Creatinine, Ser: 0.7 mg/dL (ref 0.44–1.00)
Glucose, Bld: 115 mg/dL — ABNORMAL HIGH (ref 70–99)
HCT: 35 % — ABNORMAL LOW (ref 36.0–46.0)
Hemoglobin: 11.9 g/dL — ABNORMAL LOW (ref 12.0–15.0)
Potassium: 4.2 mmol/L (ref 3.5–5.1)
Sodium: 138 mmol/L (ref 135–145)
TCO2: 24 mmol/L (ref 22–32)

## 2024-10-05 LAB — COMPREHENSIVE METABOLIC PANEL WITH GFR
ALT: 11 U/L (ref 0–44)
AST: 25 U/L (ref 15–41)
Albumin: 3.4 g/dL — ABNORMAL LOW (ref 3.5–5.0)
Alkaline Phosphatase: 127 U/L — ABNORMAL HIGH (ref 38–126)
Anion gap: 13 (ref 5–15)
BUN: 20 mg/dL (ref 8–23)
CO2: 24 mmol/L (ref 22–32)
Calcium: 9.2 mg/dL (ref 8.9–10.3)
Chloride: 100 mmol/L (ref 98–111)
Creatinine, Ser: 0.78 mg/dL (ref 0.44–1.00)
GFR, Estimated: >60 mL/min (ref >60)
Glucose, Bld: 115 mg/dL — ABNORMAL HIGH (ref 70–99)
Potassium: 4.1 mmol/L (ref 3.5–5.1)
Sodium: 137 mmol/L (ref 135–145)
Total Bilirubin: 0.4 mg/dL (ref 0.0–1.2)
Total Protein: 7.3 g/dL (ref 6.5–8.1)

## 2024-10-05 LAB — URINALYSIS, ROUTINE W REFLEX MICROSCOPIC
Bilirubin Urine: NEGATIVE
Glucose, UA: NEGATIVE mg/dL
Ketones, ur: 20 mg/dL — AB
Nitrite: NEGATIVE
Protein, ur: NEGATIVE mg/dL
Specific Gravity, Urine: 1.02 (ref 1.005–1.030)
pH: 6 (ref 5.0–8.0)

## 2024-10-05 LAB — ABO/RH: ABO/RH(D): A NEG

## 2024-10-05 LAB — PROTIME-INR
INR: 1.5 — ABNORMAL HIGH (ref 0.8–1.2)
Prothrombin Time: 19.3 s — ABNORMAL HIGH (ref 11.4–15.2)

## 2024-10-05 LAB — APTT: aPTT: 28 s (ref 24–36)

## 2024-10-05 MED ORDER — MORPHINE SULFATE (PF) 4 MG/ML IV SOLN: 4.0000 mg | Freq: Once | INTRAVENOUS | Status: DC

## 2024-10-05 MED ORDER — FENTANYL CITRATE (PF) 50 MCG/ML IJ SOSY
50.0000 ug | PREFILLED_SYRINGE | INTRAMUSCULAR | Status: AC | PRN
  Administered 2024-10-05 (×2): 50 ug via INTRAVENOUS
  Filled 2024-10-05 (×2): qty 1

## 2024-10-05 MED ORDER — MIRTAZAPINE 7.5 MG PO TABS
3.7500 mg | ORAL_TABLET | Freq: Every day | ORAL | Status: DC
  Administered 2024-10-05 – 2024-10-09 (×5): 7.5 mg via ORAL
  Filled 2024-10-05 (×6): qty 1

## 2024-10-05 MED ORDER — POLYETHYLENE GLYCOL 3350 17 G PO PACK: 17.0000 g | PACK | Freq: Every day | ORAL | Status: DC | PRN

## 2024-10-05 MED ORDER — SODIUM CHLORIDE 0.9 % IV BOLUS
500.0000 mL | Freq: Once | INTRAVENOUS | Status: AC
  Administered 2024-10-05: 500 mL via INTRAVENOUS

## 2024-10-05 MED ORDER — LAMOTRIGINE 100 MG PO TABS
200.0000 mg | ORAL_TABLET | Freq: Two times a day (BID) | ORAL | Status: DC
  Administered 2024-10-05 – 2024-10-10 (×9): 200 mg via ORAL
  Filled 2024-10-05 (×9): qty 2

## 2024-10-05 MED ORDER — OXYCODONE-ACETAMINOPHEN 5-325 MG PO TABS
1.0000 | ORAL_TABLET | Freq: Once | ORAL | Status: AC
  Administered 2024-10-05: 1 via ORAL
  Filled 2024-10-05: qty 1

## 2024-10-05 MED ORDER — LAMOTRIGINE 25 MG PO TABS
50.0000 mg | ORAL_TABLET | Freq: Two times a day (BID) | ORAL | Status: DC
  Administered 2024-10-05 – 2024-10-10 (×9): 50 mg via ORAL
  Filled 2024-10-05 (×9): qty 2

## 2024-10-05 MED ORDER — HYDROMORPHONE HCL 1 MG/ML IJ SOLN
0.5000 mg | INTRAMUSCULAR | Status: DC | PRN
  Administered 2024-10-06 (×2): 0.5 mg via INTRAVENOUS
  Filled 2024-10-05 (×3): qty 1

## 2024-10-05 MED ORDER — HYDROCODONE-ACETAMINOPHEN 5-325 MG PO TABS
1.0000 | ORAL_TABLET | Freq: Four times a day (QID) | ORAL | Status: DC | PRN
  Administered 2024-10-05 (×2): 1 via ORAL
  Filled 2024-10-05: qty 1

## 2024-10-05 MED ORDER — DEXTROSE 5 % IV SOLN
10.0000 mg | Freq: Once | INTRAVENOUS | Status: AC
  Administered 2024-10-05: 10 mg via INTRAVENOUS
  Filled 2024-10-05: qty 1

## 2024-10-05 NOTE — Progress Notes (Signed)
 Consult received.  Imaging reviewed which shows right intertroch fx.  Will plan to do surgical repair tomorrow.  Patient on coumadin  which will need to be held for surgery, INR pending, vit K ordered.  NPO after midnight.    Evelyn [name] Ozell Cummins, MD [facility] 4:32 PM

## 2024-10-05 NOTE — ED Notes (Signed)
 Trauma Event Note    Nontrauma Activation--   This TRN followed pt to CT, assisted with move to CT table. Had not had port pelvis xray prior to CT, provider notified - requested CT pelvis while still on the table.   Does have a sheet as a binder on- obvious hip deformity-- placed on hospital bed from CT table for comfort-- Ozell Ned PA for ORtho notified.   Last imported Vital Signs BP 128/64 (BP Location: Left Arm)   Pulse 85   Resp (!) 22   SpO2 100%   Trending CBC No results for input(s): WBC, HGB, HCT, PLT in the last 72 hours.  Trending Coag's No results for input(s): APTT, INR in the last 72 hours.  Trending BMET No results for input(s): NA, K, CL, CO2, BUN, CREATININE, GLUCOSE in the last 72 hours.    Evelyn [name]  Trauma Response RN  Please call TRN at [phone] for further assistance.

## 2024-10-05 NOTE — ED Provider Notes (Signed)
 [place] [facility]GENC[facility] DEPART[facility] AT [facility] Provider Note   CSN: [id number] Arrival date & time: 10/05/24  1435     Patient presents with: No chief complaint on file.   Evelyn [name] is a 78 y.o. female.   HPI  Patient is a 78 year old female with past medical history significant for DVT/PE on Coumadin   Patient has a history of frequent falls and gait instability does have a history of ventriculomegaly as well.   Patient presents emergency room today after a fall that occurred at home ground-level fall struck head and landed on right hip.  Has right hip pain and mild headache.  No loss of consciousness no nausea or vomiting no confusion.     Prior to Admission medications   Medication Sig Start Date End Date Taking? Authorizing Provider  acetaminophen  (TYLENOL ) 500 MG tablet Take 500 mg by mouth every 6 (six) hours as needed. Patient taking differently: Take 500 mg by mouth as needed.    [provider]  ascorbic acid (VITAMIN C) 1000 MG tablet Take 1,000 mg by mouth daily.    [provider]  Biotin 1 MG CAPS Take 1 mg by mouth daily.    [provider]  Calcium Citrate-Vitamin D (CALCIUM CITRATE + D PO) Take 1 tablet by mouth 2 (two) times daily with a meal.    [provider]  cetirizine (ZYRTEC) 10 MG tablet Take 10 mg by mouth daily.    [provider]  Coenzyme Q10 50 MG CAPS Take 1 capsule by mouth every morning.    [provider]  Cyanocobalamin (B-12) 2000 MCG TABS Take 1 tablet by mouth daily. 06/13/20   [provider]  lamoTRIgine  (LAMICTAL ) 100 MG tablet Take 0.5 tablets (50 mg total) by mouth 2 (two) times daily. 03/23/24 03/18/25  Camara, Amadou, MD  lamoTRIgine  (LAMICTAL ) 200 MG tablet Take 1 tablet (200 mg total) by mouth 2 (two) times daily. 07/21/24 07/16/25  Camara, Amadou, MD  mirtazapine  (REMERON ) 7.5 MG tablet Take 3.75-7.5 mg by mouth at bedtime. 08/16/22   [provider]   Multiple Vitamin (MULTIVITAMIN WITH MINERALS) TABS tablet Take 1 tablet by mouth daily.    [provider]  Olopatadine HCl (PATADAY) 0.7 % SOLN Place 1 drop into both eyes daily as needed (allergies).    [provider]  Omega 3 1000 MG CAPS Take 1 capsule by mouth every morning.    [provider]  Polyethyl Glycol-Propyl Glycol (SYSTANE) 0.4-0.3 % SOLN Place 1 drop into both eyes every morning.    [provider]  triamcinolone cream (KENALOG) 0.1 % Apply topically 2 (two) times daily. 08/29/24   [provider]  warfarin (COUMADIN ) 7.5 MG tablet TAKE 1 TABLET BY MOUTH DAILY AS DIRECTED BY COAGULATION CLINIC 07/17/24   Court Dorn PARAS, MD    Allergies: Tegretol [carbamazepine]    Review of Systems  Updated Vital Signs BP 134/63   Pulse 76   Resp 12   SpO2 100%   Physical Exam Vitals and nursing note reviewed.  Constitutional:      General: She is not in acute distress. HENT:     Head: Normocephalic and atraumatic.     Nose: Nose normal.     Mouth/Throat:     Mouth: Mucous membranes are moist.  Eyes:     General: No scleral icterus. Cardiovascular:     Rate and Rhythm: Normal rate and regular rhythm.     Pulses: Normal pulses.  Heart sounds: Normal heart sounds.  Pulmonary:     Effort: Pulmonary effort is normal. No respiratory distress.     Breath sounds: No wheezing.  Abdominal:     Palpations: Abdomen is soft.     Tenderness: There is no abdominal tenderness. There is no guarding or rebound.  Musculoskeletal:     Cervical back: Normal range of motion.     Right lower leg: No edema.     Left lower leg: No edema.     Comments: Right hip tenderness, right hip shortened and externally rotated.  No C, T, L-spine tenderness  Extremities otherwise normal  Skin:    General: Skin is warm and dry.     Capillary Refill: Capillary refill takes less than 2 seconds.  Neurological:     Mental Status: She is alert. Mental  status is at baseline.  Psychiatric:        Mood and Affect: Mood normal.        Behavior: Behavior normal.     (all labs ordered are listed, but only abnormal results are displayed) Labs Reviewed  CBC - Abnormal; Notable for the following components:      Result Value   WBC 15.7 (*)    Hemoglobin 11.3 (*)    All other components within normal limits  COMPREHENSIVE METABOLIC PANEL WITH GFR - Abnormal; Notable for the following components:   Glucose, Bld 115 (*)    Albumin 3.4 (*)    Alkaline Phosphatase 127 (*)    All other components within normal limits  PROTIME-INR - Abnormal; Notable for the following components:   Prothrombin Time 19.3 (*)    INR 1.5 (*)    All other components within normal limits  I-STAT CHEM 8, ED - Abnormal; Notable for the following components:   BUN 24 (*)    Glucose, Bld 115 (*)    Calcium, Ion 1.12 (*)    Hemoglobin 11.9 (*)    HCT 35.0 (*)    All other components within normal limits  SURGICAL PCR SCREEN  APTT  URINALYSIS, ROUTINE W REFLEX MICROSCOPIC  PROTIME-INR  CBC  BASIC METABOLIC PANEL WITH GFR  ABO/RH  TYPE AND SCREEN    EKG: None  Radiology: DG FEMUR PORT, MIN 2 VIEWS RIGHT Result Date: 10/05/2024 CLINICAL DATA:  Fall.  Right hip pain. EXAM: RIGHT FEMUR PORTABLE 2 VIEW COMPARISON:  Pelvic CT dated 10/05/2024 FINDINGS: Comminuted and mildly displaced intertrochanteric fracture of the right femur. No dislocation. The bones are osteopenic. The soft tissues are unremarkable. IMPRESSION: Comminuted and mildly displaced intertrochanteric fracture of the right femur. Electronically Signed   By: Vanetta Chou M.D.   On: 10/05/2024 17:18   CT Head Wo Contrast Result Date: 10/05/2024 EXAM: CT HEAD WITHOUT CONTRAST 10/05/2024 03:49:14 PM TECHNIQUE: CT of the head was performed without the administration of intravenous contrast. Automated exposure control, iterative reconstruction, and/or weight based adjustment of the mA/kV was utilized  to reduce the radiation dose to as low as reasonably achievable. Examination is somewhat limited due to motion artifact. COMPARISON: 2017. CLINICAL HISTORY: Head trauma, minor (Age >= 65y). Trauma pt, altered and not holding still. FINDINGS: BRAIN AND VENTRICLES: No acute hemorrhage. No evidence of acute infarct. Prominence of the lateral ventricles at a proportion to the degree of sulcal enlargement. There is a borderline callosal angle. Evans index measures approximately 0.42. No extra-axial collection. No mass effect or midline shift. Mild chronic microvascular ischemic changes. ORBITS: No acute abnormality. SINUSES: No acute abnormality.  SOFT TISSUES AND SKULL: No acute soft tissue abnormality. No skull fracture. IMPRESSION: 1. No acute intracranial abnormality. 2. Prominence of the lateral ventricles which could reflect normal pressure hydrocephalus in the appropriate clinical context. 3. Mild chronic microvascular ischemic changes. Electronically signed by: Donnice Mania MD 10/05/2024 04:01 PM EDT RP Workstation: [id number]   CT PELVIS WO CONTRAST Result Date: 10/05/2024 CLINICAL DATA:  Trauma. EXAM: CT PELVIS WITHOUT CONTRAST TECHNIQUE: Multidetector CT imaging of the pelvis was performed following the standard protocol without intravenous contrast. RADIATION DOSE REDUCTION: This exam was performed according to the departmental dose-optimization program which includes automated exposure control, adjustment of the mA and/or kV according to patient size and/or use of iterative reconstruction technique. COMPARISON:  CT abdomen pelvis dated 10/08/2009. FINDINGS: Urinary Tract:  The urinary bladder is unremarkable. Bowel:  No dilated bowel loops.  The appendix is normal. Vascular/Lymphatic: Moderate aortoiliac atherosclerotic disease. No pelvic adenopathy. Reproductive: The uterus is grossly unremarkable. No suspicious adnexal masses. Other:  None Musculoskeletal: Comminuted and mildly displaced  intertrochanteric fracture of the right femur with displaced fracture fragments of the greater and lesser trochanters. No dislocation. The bones are osteopenic. IMPRESSION: 1. Comminuted and mildly displaced intertrochanteric fracture of the right femur. 2.  Aortic Atherosclerosis (ICD10-I70.0). Electronically Signed   By: Vanetta Chou M.D.   On: 10/05/2024 15:59     Procedures   Medications Ordered in the ED  fentaNYL  (SUBLIMAZE ) injection 50 mcg (50 mcg Intravenous Given 10/05/24 1758)  mirtazapine  (REMERON ) tablet 3.75-7.5 mg (has no administration in time range)  lamoTRIgine  (LAMICTAL ) tablet 50 mg (has no administration in time range)  lamoTRIgine  (LAMICTAL ) tablet 200 mg (has no administration in time range)  HYDROcodone -acetaminophen  (NORCO/VICODIN) 5-325 MG per tablet 1 tablet (has no administration in time range)  HYDROmorphone  (DILAUDID ) injection 0.5 mg (has no administration in time range)  polyethylene glycol (MIRALAX  / GLYCOLAX ) packet 17 g (has no administration in time range)  oxyCODONE -acetaminophen  (PERCOCET/ROXICET) 5-325 MG per tablet 1 tablet (1 tablet Oral Given 10/05/24 1619)  sodium chloride  0.9 % bolus 500 mL (500 mLs Intravenous New Bag/Given 10/05/24 1716)  phytonadione (VITAMIN K) 10 mg in dextrose  5 % 50 mL IVPB (10 mg Intravenous New Bag/Given 10/05/24 1718)                                    Medical Decision Making Amount and/or Complexity of Data Reviewed Labs: ordered. Radiology: ordered.  Risk Prescription drug management. Decision regarding hospitalization.   Patient is a 78 year old female with past medical history significant for DVT/PE on Coumadin   Patient has a history of frequent falls and gait instability does have a history of ventriculomegaly as well.   Patient presents emergency room today after a fall that occurred at home ground-level fall struck head and landed on right hip.  Has right hip pain and mild headache.  No loss of  consciousness no nausea or vomiting no confusion.  Fracture of right femur.  Ozell Purchase of orthopedics evaluated patient  Discussed with Dr. Seena who will admit.  Patient is on warfarin INR is 1.5.  Urine pending home labs unremarkable CT head unremarkable EKG unchanged from prior.  Final diagnoses:  Closed fracture of right hip, initial encounter [facility])    ED Discharge Orders     None          Neldon Hamp RAMAN, [place] 10/05/24 1950    Mannie Fairy DASEN,  DO 10/06/24 2229

## 2024-10-05 NOTE — ED Triage Notes (Addendum)
 Pt BIB [facility] from home after a fall, pt reports she turned and lost her balance, denies hitting head, on coumadin . EMS reports shortening of right hip, reports hip pain.   146/82 CBG 130

## 2024-10-05 NOTE — Consult Note (Signed)
 Reason for Consult:Right hip fx Referring Physician: Larnell Gravely Time called: 1558 Time at bedside: 1600   Evelyn [name] is an 78 y.o. female.  HPI: Evelyn [name] tripped over a stair in her kitchen and fell. She had immediate right hip pain and could not get up. She was brought to the ED where x-rays showed a right hip fx and orthopedic surgery was consulted. She lives at home with her husband and typically uses a rollator for ambulation.  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)    History of echocardiogram 03/11/09   nl EF 60-65%   History of recurrent UTIs    feels like may have one now   PE (pulmonary embolism) 2005   Peripheral vascular disease    dvt leg rt   Seizures (HCC)     Past Surgical History:  Procedure Laterality Date   arm surgery Right    INSERTION OF VENA CAVA FILTER  10   KNEE SURGERY Left 96   kneecap fx   ORIF HUMERUS FRACTURE Right 08/01/2013   Procedure: NON-UNION REPAIR RIGHT PROXIMAL HUMERUS FRACTURE;  Surgeon: Evelyn VEAR Bruch, MD;  Location: [facility] OR;  Service: Orthopedics;  Laterality: Right;    Family History  Problem Relation Age of Onset   Diabetes Mother    Stomach cancer Mother    Stroke Father    Esophageal cancer Neg Hx    Colon cancer Neg Hx    Liver disease Neg Hx    Pancreatic cancer Neg Hx     Social History:  reports that she has never smoked. She has never used smokeless tobacco. She reports that she does not drink alcohol and does not use drugs.  Allergies:  Allergies  Allergen Reactions   Tegretol [Carbamazepine] Rash    Medications: I have reviewed the patient's current medications.  No results found for this or any previous visit (from the past 48 hours).  CT Head Wo Contrast Result Date: 10/05/2024 EXAM: CT HEAD WITHOUT CONTRAST  10/05/2024 03:49:14 PM TECHNIQUE: CT of the head was performed without the administration of intravenous contrast. Automated exposure control, iterative reconstruction, and/or weight based adjustment of the mA/kV was utilized to reduce the radiation dose to as low as reasonably achievable. Examination is somewhat limited due to motion artifact. COMPARISON: 2017. CLINICAL HISTORY: Head trauma, minor (Age >= 65y). Trauma pt, altered and not holding still. FINDINGS: BRAIN AND VENTRICLES: No acute hemorrhage. No evidence of acute infarct. Prominence of the lateral ventricles at a proportion to the degree of sulcal enlargement. There is a borderline callosal angle. Evans index measures approximately 0.42. No extra-axial collection. No mass effect or midline shift. Mild chronic microvascular ischemic changes. ORBITS: No acute abnormality. SINUSES: No acute abnormality. SOFT TISSUES AND SKULL: No acute soft tissue abnormality. No skull fracture. IMPRESSION: 1. No acute intracranial abnormality. 2. Prominence of the lateral ventricles which could reflect normal pressure hydrocephalus in the appropriate clinical context. 3. Mild chronic microvascular ischemic changes. Electronically signed by: Donnice Mania MD 10/05/2024 04:01 PM EDT RP Workstation: [id number]   CT PELVIS WO CONTRAST Result Date: 10/05/2024 CLINICAL DATA:  Trauma. EXAM: CT PELVIS WITHOUT CONTRAST TECHNIQUE: Multidetector CT imaging of the pelvis was performed following the standard protocol without intravenous contrast. RADIATION DOSE REDUCTION: This exam was performed  according to the departmental dose-optimization program which includes automated exposure control, adjustment of the mA and/or kV according to patient size and/or use of iterative reconstruction technique. COMPARISON:  CT abdomen pelvis dated 10/08/2009. FINDINGS: Urinary Tract:  The urinary bladder is unremarkable. Bowel:  No dilated bowel loops.  The appendix is normal.  Vascular/Lymphatic: Moderate aortoiliac atherosclerotic disease. No pelvic adenopathy. Reproductive: The uterus is grossly unremarkable. No suspicious adnexal masses. Other:  None Musculoskeletal: Comminuted and mildly displaced intertrochanteric fracture of the right femur with displaced fracture fragments of the greater and lesser trochanters. No dislocation. The bones are osteopenic. IMPRESSION: 1. Comminuted and mildly displaced intertrochanteric fracture of the right femur. 2.  Aortic Atherosclerosis (ICD10-I70.0). Electronically Signed   By: Vanetta Chou M.D.   On: 10/05/2024 15:59    Review of Systems  HENT:  Negative for ear discharge, ear pain, hearing loss and tinnitus.   Eyes:  Negative for photophobia and pain.  Respiratory:  Negative for cough and shortness of breath.   Cardiovascular:  Negative for chest pain.  Gastrointestinal:  Negative for abdominal pain, nausea and vomiting.  Genitourinary:  Negative for dysuria, flank pain, frequency and urgency.  Musculoskeletal:  Positive for arthralgias (Right hip). Negative for back pain, myalgias and neck pain.  Neurological:  Negative for dizziness and headaches.  Hematological:  Does not bruise/bleed easily.  Psychiatric/Behavioral:  The patient is not nervous/anxious.    Blood pressure 128/64, pulse 85, resp. rate (!) 22, SpO2 100%. Physical Exam Constitutional:      General: She is not in acute distress.    Appearance: She is well-developed. She is not diaphoretic.  HENT:     Head: Normocephalic and atraumatic.  Eyes:     General: No scleral icterus.       Right eye: No discharge.        Left eye: No discharge.     Conjunctiva/sclera: Conjunctivae normal.  Cardiovascular:     Rate and Rhythm: Normal rate and regular rhythm.  Pulmonary:     Effort: Pulmonary effort is normal. No respiratory distress.  Musculoskeletal:     Cervical back: Normal range of motion.     Comments: RLE No traumatic wounds, ecchymosis, or  rash  Mod TTP hip  No knee or ankle effusion  Knee stable to varus/ valgus and anterior/posterior stress  Sens DPN, SPN, TN intact  Motor EHL, ext, flex, evers 5/5  DP 2+, PT 2+, No significant edema  Skin:    General: Skin is warm and dry.  Neurological:     Mental Status: She is alert.  Psychiatric:        Mood and Affect: Mood normal.        Behavior: Behavior normal.     Assessment/Plan: Right hip fx -- Plan surgery today with Dr. Jerri.    Evelyn DOROTHA Ned, PA-C Orthopedic Surgery [phone] 10/05/2024, 4:06 PM

## 2024-10-05 NOTE — H&P (Signed)
 History and Physical   Evelyn [name] FMW:[id number] DOB: 1946/02/26 DOA: 10/05/2024  PCP: Avva, Ravisankar, MD   Patient coming from: Home  Chief Complaint: Fall, hip pain  HPI: Evelyn [name] is a 78 y.o. female with medical history significant of A-fib, PAD, focal epilepsy, memory impairment, DVT, PE, migraines presenting after fall at home.  Patient tripped over a step in her kitchen and fell to the ground.  Had immediate pain in her right hip after the fall and was unable to get up.  Was transported to the ED for further evaluation.  Denies fevers, chills, chest pain, shortness of breath, abdominal pain, constipation, diarrhea, nausea, vomiting.  ED Course: Vital signs in ED stay well.  Lab workup included CMP with albumin 3.4 and alk phos 127, glucose 115.  CBC with leukocytosis to 15.7, hemoglobin stable at 11.3.  PT 19.3, INR 1.5.  Urinalysis pending.  CT head showed no acute normality did show prominent lateral ventricles.    Right femur x-ray showed comminuted, mildly displaced intertrochanteric right femur fracture.  CT pelvis showed the same comminuted, mildly displaced intertrochanteric right femur fracture.  Orthopedic consulted and plan for surgical intervention tomorrow.  Warfarin to be held, vitamin K has been given.  Patient was also received fentanyl , oxycodone , 500 cc IV fluid.  Review of Systems: As per HPI otherwise all other systems reviewed and are negative.  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, but unable to be removed, being treated  by Dr Darcy at [facility]   Elevated troponin 08/22/2024   Headache(784.0)    History of echocardiogram 03/11/2009   nl EF 60-65%   History of recurrent UTIs    feels like may have one now   PE (pulmonary embolism) 12/15/2003   Peripheral vascular disease    dvt leg rt   Seizures (HCC)     Past Surgical History:  Procedure Laterality Date   arm surgery Right    INSERTION OF VENA CAVA FILTER  10   KNEE SURGERY Left 96   kneecap fx   ORIF HUMERUS FRACTURE Right 08/01/2013   Procedure: NON-UNION REPAIR RIGHT PROXIMAL HUMERUS FRACTURE;  Surgeon: Ozell VEAR Bruch, MD;  Location: [facility] OR;  Service: Orthopedics;  Laterality: Right;    Social History  reports that she has never smoked. She has never used smokeless tobacco. She reports that she does not drink alcohol and does not use drugs.  Allergies  Allergen Reactions   Tegretol [Carbamazepine] Rash    Family History  Problem Relation Age of Onset   Diabetes Mother    Stomach cancer Mother    Stroke Father    Esophageal cancer Neg Hx    Colon cancer Neg Hx    Liver disease Neg Hx    Pancreatic cancer Neg Hx   Reviewed on admission  Prior to Admission medications   Medication Sig Start Date End Date Taking? Authorizing Provider  acetaminophen  (TYLENOL ) 500 MG tablet Take 500 mg by mouth every 6 (six) hours as needed. Patient taking differently: Take 500 mg by mouth as needed.  [provider]  ascorbic acid (VITAMIN C) 1000 MG tablet Take 1,000 mg by mouth daily.    [provider]  Biotin 1 MG CAPS Take 1 mg by mouth daily.    [provider]  Calcium Citrate-Vitamin D (CALCIUM CITRATE + D PO) Take 1 tablet by mouth 2 (two) times daily with a meal.    [provider]  cetirizine (ZYRTEC) 10 MG tablet Take 10 mg by mouth daily.    [provider]  Coenzyme Q10 50 MG CAPS Take 1 capsule by mouth every morning.    [provider]  Cyanocobalamin (B-12) 2000 MCG TABS Take 1 tablet  by mouth daily. 06/13/20   [provider]  lamoTRIgine  (LAMICTAL ) 100 MG tablet Take 0.5 tablets (50 mg total) by mouth 2 (two) times daily. 03/23/24 03/18/25  Camara, Amadou, MD  lamoTRIgine  (LAMICTAL ) 200 MG tablet Take 1 tablet (200 mg total) by mouth 2 (two) times daily. 07/21/24 07/16/25  Camara, Amadou, MD  mirtazapine  (REMERON ) 7.5 MG tablet Take 3.75-7.5 mg by mouth at bedtime. 08/16/22   [provider]  Multiple Vitamin (MULTIVITAMIN WITH MINERALS) TABS tablet Take 1 tablet by mouth daily.    [provider]  nitrofurantoin, macrocrystal-monohydrate, (MACROBID) 100 MG capsule Take 100 mg by mouth 2 (two) times daily. 09/05/24   [provider]  Olopatadine HCl (PATADAY) 0.7 % SOLN Place 1 drop into both eyes daily as needed (allergies).    [provider]  Omega 3 1000 MG CAPS Take 1 capsule by mouth every morning.    [provider]  Polyethyl Glycol-Propyl Glycol (SYSTANE) 0.4-0.3 % SOLN Place 1 drop into both eyes every morning.    [provider]  triamcinolone cream (KENALOG) 0.1 % Apply topically 2 (two) times daily. 08/29/24   [provider]  warfarin (COUMADIN ) 7.5 MG tablet TAKE 1 TABLET BY MOUTH DAILY AS DIRECTED BY COAGULATION CLINIC 07/17/24   Court Dorn PARAS, MD    Physical Exam: Vitals:   10/05/24 1441 10/05/24 1745  BP: 128/64 134/63  Pulse: 85 76  Resp: (!) 22 12  SpO2: 100% 100%    Physical Exam Constitutional:      General: She is not in acute distress.    Appearance: Normal appearance.  HENT:     Head: Normocephalic and atraumatic.     Mouth/Throat:     Mouth: Mucous membranes are moist.     Pharynx: Oropharynx is clear.  Eyes:     Extraocular Movements: Extraocular movements intact.     Pupils: Pupils are equal, round, and reactive to light.  Cardiovascular:     Rate and Rhythm: Normal rate and regular rhythm.     Pulses: Normal pulses.     Heart sounds: Normal heart sounds.  Pulmonary:      Effort: Pulmonary effort is normal. No respiratory distress.     Breath sounds: Normal breath sounds.  Abdominal:     General: Bowel sounds are normal. There is no distension.     Palpations: Abdomen is soft.     Tenderness: There is no abdominal tenderness.  Musculoskeletal:        General: No swelling or deformity.     Comments: Bilateral lower extremities neurovascularly intact.  Right lower extremity foreshortened.  Skin:    General: Skin is warm and dry.  Neurological:     General: No focal deficit present.     Mental Status: Mental status is at baseline.    Labs  on Admission: I have personally reviewed following labs and imaging studies  CBC: Recent Labs  Lab 10/05/24 1622 10/05/24 1645  WBC 15.7*  --   HGB 11.3* 11.9*  HCT 36.0 35.0*  MCV 88.7  --   PLT 297  --     Basic Metabolic Panel: Recent Labs  Lab 10/05/24 1622 10/05/24 1645  NA 137 138  K 4.1 4.2  CL 100 106  CO2 24  --   GLUCOSE 115* 115*  BUN 20 24*  CREATININE 0.78 0.70  CALCIUM 9.2  --     GFR: CrCl cannot be calculated (Unknown ideal weight.).  Liver Function Tests: Recent Labs  Lab 10/05/24 1622  AST 25  ALT 11  ALKPHOS 127*  BILITOT 0.4  PROT 7.3  ALBUMIN 3.4*    Urine analysis:    Component Value Date/Time   COLORURINE YELLOW 08/02/2013 1018   APPEARANCEUR CLEAR 08/02/2013 1018   LABSPEC 1.012 08/02/2013 1018   PHURINE 6.5 08/02/2013 1018   GLUCOSEU NEGATIVE 08/02/2013 1018   HGBUR NEGATIVE 08/02/2013 1018   BILIRUBINUR NEGATIVE 08/02/2013 1018   KETONESUR NEGATIVE 08/02/2013 1018   PROTEINUR NEGATIVE 08/02/2013 1018   UROBILINOGEN 0.2 08/02/2013 1018   NITRITE NEGATIVE 08/02/2013 1018   LEUKOCYTESUR TRACE (A) 08/02/2013 1018    Radiological Exams on Admission: DG FEMUR PORT, MIN 2 VIEWS RIGHT Result Date: 10/05/2024 CLINICAL DATA:  Fall.  Right hip pain. EXAM: RIGHT FEMUR PORTABLE 2 VIEW COMPARISON:  Pelvic CT dated 10/05/2024 FINDINGS: Comminuted and mildly  displaced intertrochanteric fracture of the right femur. No dislocation. The bones are osteopenic. The soft tissues are unremarkable. IMPRESSION: Comminuted and mildly displaced intertrochanteric fracture of the right femur. Electronically Signed   By: Vanetta Chou M.D.   On: 10/05/2024 17:18   CT Head Wo Contrast Result Date: 10/05/2024 EXAM: CT HEAD WITHOUT CONTRAST 10/05/2024 03:49:14 PM TECHNIQUE: CT of the head was performed without the administration of intravenous contrast. Automated exposure control, iterative reconstruction, and/or weight based adjustment of the mA/kV was utilized to reduce the radiation dose to as low as reasonably achievable. Examination is somewhat limited due to motion artifact. COMPARISON: 2017. CLINICAL HISTORY: Head trauma, minor (Age >= 65y). Trauma pt, altered and not holding still. FINDINGS: BRAIN AND VENTRICLES: No acute hemorrhage. No evidence of acute infarct. Prominence of the lateral ventricles at a proportion to the degree of sulcal enlargement. There is a borderline callosal angle. Evans index measures approximately 0.42. No extra-axial collection. No mass effect or midline shift. Mild chronic microvascular ischemic changes. ORBITS: No acute abnormality. SINUSES: No acute abnormality. SOFT TISSUES AND SKULL: No acute soft tissue abnormality. No skull fracture. IMPRESSION: 1. No acute intracranial abnormality. 2. Prominence of the lateral ventricles which could reflect normal pressure hydrocephalus in the appropriate clinical context. 3. Mild chronic microvascular ischemic changes. Electronically signed by: Donnice Mania MD 10/05/2024 04:01 PM EDT RP Workstation: [id number]   CT PELVIS WO CONTRAST Result Date: 10/05/2024 CLINICAL DATA:  Trauma. EXAM: CT PELVIS WITHOUT CONTRAST TECHNIQUE: Multidetector CT imaging of the pelvis was performed following the standard protocol without intravenous contrast. RADIATION DOSE REDUCTION: This exam was performed according to  the departmental dose-optimization program which includes automated exposure control, adjustment of the mA and/or kV according to patient size and/or use of iterative reconstruction technique. COMPARISON:  CT abdomen pelvis dated 10/08/2009. FINDINGS: Urinary Tract:  The urinary bladder is unremarkable. Bowel:  No dilated bowel loops.  The appendix is normal. Vascular/Lymphatic: Moderate aortoiliac atherosclerotic disease. No  pelvic adenopathy. Reproductive: The uterus is grossly unremarkable. No suspicious adnexal masses. Other:  None Musculoskeletal: Comminuted and mildly displaced intertrochanteric fracture of the right femur with displaced fracture fragments of the greater and lesser trochanters. No dislocation. The bones are osteopenic. IMPRESSION: 1. Comminuted and mildly displaced intertrochanteric fracture of the right femur. 2.  Aortic Atherosclerosis (ICD10-I70.0). Electronically Signed   By: Vanetta Chou M.D.   On: 10/05/2024 15:59   EKG: Independently reviewed.  Sinus rhythm at 74 beats minute.  Assessment/Plan Principal Problem:   Displaced intertrochanteric fracture of right femur, initial encounter for closed fracture [facility]) Active Problems:   History of DVT (deep vein thrombosis)   Atrial fibrillation (HCC)   History of pulmonary embolus (PE)   Focal epilepsy (HCC)   Memory impairment   Migraine   Closed comminuted intertrochanteric fracture of proximal end of right femur (HCC)   Fall Femur fracture > Patient had a fall in her kitchen over a stair.  Hip pain afterwards.  Imaging here shows intertrochanteric right femur fracture. > Orthopedic consulted in the ED plan for surgical intervention tomorrow. - Monitor on MedSurg with continuous pulse ox overnight - Appreciate orthopedic surgery recommendations and assistance - Hip fracture protocol - As needed Norco for moderate pain, Dilaudid  for severe pain - Anesthesia consult for nerve block when able - N.p.o. 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  Priorly diagnosed with disequilibrium syndrome.  Previously followed with [facility], now follows with [facility]. > Had workup in 2020 12/2020 with normal VNG and MRI brain with no ancillary findings suggesting NPH.  Follow-up CTs have shown stable ventriculomegaly. - Continue to follow-up with neurology outpatient  A-fib - Holding warfarin as above  PAD - Holding warfarin as above  Focal epilepsy - Continue home Lamictal   History of DVT and PE - Holding warfarin as above  DVT prophylaxis: SCDs Code Status:   Full Family Communication:  Updated at bedside  Disposition Plan:   Patient is from:  Home  Anticipated DC to:  Home  Anticipated DC date:  2 to 3 days  Anticipated DC barriers: None  Consults called:  Orthopedic surgery Admission status:  Inpatient, MedSurg  Severity of Illness: The appropriate patient status for this patient is INPATIENT. Inpatient status is judged to be reasonable and necessary in order to provide the required intensity of service to ensure the patient's safety. The patient's presenting symptoms, physical exam findings, and initial radiographic and laboratory data in the context of their chronic comorbidities is felt to place them at high risk for further clinical deterioration. Furthermore, it is not anticipated that the patient will be medically stable for discharge from the hospital within 2 midnights of admission.   * I certify that at the point of admission it is my clinical judgment that the patient will require inpatient hospital care spanning beyond 2 midnights from the point of admission due to high intensity of service, high risk for further deterioration and high frequency of surveillance required.DEWAINE Marsa KATHEE Seena MD [facility]  How to contact the  [facility] Attending or Consulting provider 7A - 7P or covering provider during after hours 7P -7A, for this patient?   Check the care team in [facility] and look for a) attending/consulting [facility] provider listed and b) the [facility] team listed Log into [email or web address] and use [facility]'s universal password to access. If you do not  have the password, please contact the hospital operator. Locate the [facility] provider you are looking for under [facility] and page to a number that you can be directly reached. If you still have difficulty reaching the provider, please page the [facility] (Director on Call) for the Hospitalists listed on amion for assistance.  10/05/2024, 5:52 PM

## 2024-10-06 ENCOUNTER — Encounter (HOSPITAL_COMMUNITY): Payer: Self-pay | Admitting: Internal Medicine

## 2024-10-06 ENCOUNTER — Encounter (HOSPITAL_COMMUNITY)
Admission: EM | Disposition: A | Discharge status: Home Health | Payer: Self-pay | Source: Home / Self Care | Attending: Internal Medicine

## 2024-10-06 ENCOUNTER — Inpatient Hospital Stay (HOSPITAL_COMMUNITY): Admitting: Anesthesiology

## 2024-10-06 ENCOUNTER — Other Ambulatory Visit: Payer: Self-pay

## 2024-10-06 ENCOUNTER — Inpatient Hospital Stay (HOSPITAL_COMMUNITY)

## 2024-10-06 DIAGNOSIS — R413 Other amnesia: Secondary | ICD-10-CM | POA: Diagnosis not present

## 2024-10-06 DIAGNOSIS — Z86718 Personal history of other venous thrombosis and embolism: Secondary | ICD-10-CM | POA: Diagnosis not present

## 2024-10-06 DIAGNOSIS — S72141A Displaced intertrochanteric fracture of right femur, initial encounter for closed fracture: Secondary | ICD-10-CM

## 2024-10-06 DIAGNOSIS — Z86711 Personal history of pulmonary embolism: Secondary | ICD-10-CM | POA: Diagnosis not present

## 2024-10-06 DIAGNOSIS — I10 Essential (primary) hypertension: Secondary | ICD-10-CM

## 2024-10-06 DIAGNOSIS — I4891 Unspecified atrial fibrillation: Secondary | ICD-10-CM

## 2024-10-06 HISTORY — PX: INTRAMEDULLARY (IM) NAIL INTERTROCHANTERIC: SHX5875

## 2024-10-06 LAB — CBC
HCT: 33.1 % — ABNORMAL LOW (ref 36.0–46.0)
Hemoglobin: 10.5 g/dL — ABNORMAL LOW (ref 12.0–15.0)
MCH: 27.1 pg (ref 26.0–34.0)
MCHC: 31.7 g/dL (ref 30.0–36.0)
MCV: 85.5 fL (ref 80.0–100.0)
Platelets: 283 K/uL (ref 150–400)
RBC: 3.87 MIL/uL (ref 3.87–5.11)
RDW: 14.6 % (ref 11.5–15.5)
WBC: 12.7 K/uL — ABNORMAL HIGH (ref 4.0–10.5)
nRBC: 0 % (ref 0.0–0.2)

## 2024-10-06 LAB — BASIC METABOLIC PANEL WITH GFR
Anion gap: 11 (ref 5–15)
BUN: 21 mg/dL (ref 8–23)
CO2: 23 mmol/L (ref 22–32)
Calcium: 8.7 mg/dL — ABNORMAL LOW (ref 8.9–10.3)
Chloride: 101 mmol/L (ref 98–111)
Creatinine, Ser: 0.71 mg/dL (ref 0.44–1.00)
GFR, Estimated: >60 mL/min (ref >60)
Glucose, Bld: 123 mg/dL — ABNORMAL HIGH (ref 70–99)
Potassium: 4 mmol/L (ref 3.5–5.1)
Sodium: 135 mmol/L (ref 135–145)

## 2024-10-06 LAB — TYPE AND SCREEN
ABO/RH(D): A NEG
Antibody Screen: NEGATIVE

## 2024-10-06 LAB — PROTIME-INR
INR: 1.3 — ABNORMAL HIGH (ref 0.8–1.2)
Prothrombin Time: 16.8 s — ABNORMAL HIGH (ref 11.4–15.2)

## 2024-10-06 LAB — SURGICAL PCR SCREEN
MRSA, PCR: NEGATIVE
Staphylococcus aureus: NEGATIVE

## 2024-10-06 SURGERY — FIXATION, FRACTURE, INTERTROCHANTERIC, WITH INTRAMEDULLARY ROD
Anesthesia: General | Laterality: Right

## 2024-10-06 MED ORDER — OXYCODONE HCL 5 MG PO TABS: 5.0000 mg | ORAL_TABLET | Freq: Three times a day (TID) | ORAL | 0 refills | Status: AC | PRN

## 2024-10-06 MED ORDER — MAGNESIUM CITRATE PO SOLN: 1.0000 | Freq: Once | ORAL | Status: DC | PRN

## 2024-10-06 MED ORDER — ONDANSETRON HCL 4 MG/2ML IJ SOLN: 4.0000 mg | Freq: Four times a day (QID) | INTRAMUSCULAR | Status: DC | PRN

## 2024-10-06 MED ORDER — CEFAZOLIN SODIUM-DEXTROSE 2-4 GM/100ML-% IV SOLN
2.0000 g | Freq: Four times a day (QID) | INTRAVENOUS | Status: AC
  Administered 2024-10-06 – 2024-10-07 (×3): 2 g via INTRAVENOUS
  Filled 2024-10-06 (×3): qty 100

## 2024-10-06 MED ORDER — PHENOL 1.4 % MT LIQD: 1.0000 | OROMUCOSAL | Status: DC | PRN

## 2024-10-06 MED ORDER — FENTANYL CITRATE (PF) 100 MCG/2ML IJ SOLN
INTRAMUSCULAR | Status: AC
  Filled 2024-10-06: qty 2

## 2024-10-06 MED ORDER — LACTATED RINGERS IV SOLN: INTRAVENOUS | Status: DC

## 2024-10-06 MED ORDER — FENTANYL CITRATE (PF) 100 MCG/2ML IJ SOLN
25.0000 ug | INTRAMUSCULAR | Status: DC | PRN
  Administered 2024-10-06 (×3): 50 ug via INTRAVENOUS

## 2024-10-06 MED ORDER — AMISULPRIDE (ANTIEMETIC) 5 MG/2ML IV SOLN: 10.0000 mg | Freq: Once | INTRAVENOUS | Status: DC | PRN

## 2024-10-06 MED ORDER — ONDANSETRON HCL 4 MG PO TABS: 4.0000 mg | ORAL_TABLET | Freq: Four times a day (QID) | ORAL | Status: DC | PRN

## 2024-10-06 MED ORDER — HYDROCODONE-ACETAMINOPHEN 5-325 MG PO TABS
1.0000 | ORAL_TABLET | Freq: Two times a day (BID) | ORAL | Status: DC | PRN
  Administered 2024-10-06: 2 via ORAL
  Administered 2024-10-08 – 2024-10-10 (×6): 1 via ORAL
  Filled 2024-10-06: qty 1
  Filled 2024-10-06: qty 2
  Filled 2024-10-06 (×3): qty 1
  Filled 2024-10-06: qty 2
  Filled 2024-10-06: qty 1

## 2024-10-06 MED ORDER — OXYCODONE HCL 5 MG PO TABS
5.0000 mg | ORAL_TABLET | Freq: Once | ORAL | Status: AC | PRN
  Administered 2024-10-06: 5 mg via ORAL

## 2024-10-06 MED ORDER — TRANEXAMIC ACID-NACL 1000-0.7 MG/100ML-% IV SOLN
1000.0000 mg | Freq: Once | INTRAVENOUS | Status: AC
  Administered 2024-10-06: 1000 mg via INTRAVENOUS
  Filled 2024-10-06: qty 100

## 2024-10-06 MED ORDER — SUGAMMADEX SODIUM 200 MG/2ML IV SOLN
INTRAVENOUS | Status: DC | PRN
  Administered 2024-10-06: 200 mg via INTRAVENOUS

## 2024-10-06 MED ORDER — PROPOFOL 10 MG/ML IV BOLUS
INTRAVENOUS | Status: AC
Start: 2024-10-06 — End: 2024-10-06
  Filled 2024-10-06: qty 20

## 2024-10-06 MED ORDER — TRANEXAMIC ACID 1000 MG/10ML IV SOLN
2000.0000 mg | INTRAVENOUS | Status: DC
  Filled 2024-10-06: qty 20

## 2024-10-06 MED ORDER — ACETAMINOPHEN 325 MG PO TABS: 325.0000 mg | ORAL_TABLET | Freq: Four times a day (QID) | ORAL | Status: DC | PRN

## 2024-10-06 MED ORDER — PROPOFOL 500 MG/50ML IV EMUL
INTRAVENOUS | Status: DC | PRN
Start: 2024-10-06 — End: 2024-10-06
  Administered 2024-10-06: 125 ug/kg/min via INTRAVENOUS

## 2024-10-06 MED ORDER — PHENYLEPHRINE 80 MCG/ML (10ML) SYRINGE FOR IV PUSH (FOR BLOOD PRESSURE SUPPORT)
PREFILLED_SYRINGE | INTRAVENOUS | Status: DC | PRN
  Administered 2024-10-06: 80 ug via INTRAVENOUS
  Administered 2024-10-06: 160 ug via INTRAVENOUS
  Administered 2024-10-06: 80 ug via INTRAVENOUS

## 2024-10-06 MED ORDER — FENTANYL CITRATE (PF) 250 MCG/5ML IJ SOLN
INTRAMUSCULAR | Status: AC
  Filled 2024-10-06: qty 5

## 2024-10-06 MED ORDER — DOCUSATE SODIUM 100 MG PO CAPS
100.0000 mg | ORAL_CAPSULE | Freq: Two times a day (BID) | ORAL | Status: DC
  Administered 2024-10-06 – 2024-10-10 (×8): 100 mg via ORAL
  Filled 2024-10-06 (×8): qty 1

## 2024-10-06 MED ORDER — ALUM & MAG HYDROXIDE-SIMETH 200-200-20 MG/5ML PO SUSP: 30.0000 mL | ORAL | Status: DC | PRN

## 2024-10-06 MED ORDER — LIDOCAINE 2% (20 MG/ML) 5 ML SYRINGE
INTRAMUSCULAR | Status: AC
  Filled 2024-10-06: qty 10

## 2024-10-06 MED ORDER — METHOCARBAMOL 1000 MG/10ML IJ SOLN
500.0000 mg | Freq: Three times a day (TID) | INTRAMUSCULAR | Status: DC | PRN
  Administered 2024-10-07: 500 mg via INTRAVENOUS
  Filled 2024-10-06: qty 10

## 2024-10-06 MED ORDER — PROPOFOL 10 MG/ML IV BOLUS
INTRAVENOUS | Status: DC | PRN
  Administered 2024-10-06: 130 mg via INTRAVENOUS

## 2024-10-06 MED ORDER — SODIUM CHLORIDE 0.9 % IV SOLN: INTRAVENOUS | Status: DC

## 2024-10-06 MED ORDER — ROCURONIUM BROMIDE 10 MG/ML (PF) SYRINGE
PREFILLED_SYRINGE | INTRAVENOUS | Status: DC | PRN
  Administered 2024-10-06: 50 mg via INTRAVENOUS

## 2024-10-06 MED ORDER — ACETAMINOPHEN 500 MG PO TABS
1000.0000 mg | ORAL_TABLET | Freq: Once | ORAL | Status: AC
  Administered 2024-10-06: 1000 mg via ORAL
  Filled 2024-10-06: qty 2

## 2024-10-06 MED ORDER — OXYCODONE HCL 5 MG PO TABS
ORAL_TABLET | ORAL | Status: AC
  Filled 2024-10-06: qty 1

## 2024-10-06 MED ORDER — FENTANYL CITRATE (PF) 250 MCG/5ML IJ SOLN
INTRAMUSCULAR | Status: DC | PRN
  Administered 2024-10-06 (×3): 50 ug via INTRAVENOUS

## 2024-10-06 MED ORDER — PHENYLEPHRINE 80 MCG/ML (10ML) SYRINGE FOR IV PUSH (FOR BLOOD PRESSURE SUPPORT)
PREFILLED_SYRINGE | INTRAVENOUS | Status: AC
Start: 2024-10-06 — End: 2024-10-06
  Filled 2024-10-06: qty 10

## 2024-10-06 MED ORDER — MORPHINE SULFATE (PF) 2 MG/ML IV SOLN
0.5000 mg | INTRAVENOUS | Status: DC | PRN
  Administered 2024-10-07: 1 mg via INTRAVENOUS
  Filled 2024-10-06: qty 1

## 2024-10-06 MED ORDER — ACETAMINOPHEN 500 MG PO TABS
500.0000 mg | ORAL_TABLET | Freq: Four times a day (QID) | ORAL | Status: AC
  Administered 2024-10-06 – 2024-10-07 (×4): 500 mg via ORAL
  Filled 2024-10-06 (×4): qty 1

## 2024-10-06 MED ORDER — CEFAZOLIN SODIUM-DEXTROSE 2-4 GM/100ML-% IV SOLN
2.0000 g | INTRAVENOUS | Status: AC
  Administered 2024-10-06: 2 g via INTRAVENOUS

## 2024-10-06 MED ORDER — CHLORHEXIDINE GLUCONATE 0.12 % MT SOLN
15.0000 mL | Freq: Once | OROMUCOSAL | Status: AC
  Administered 2024-10-06: 15 mL via OROMUCOSAL

## 2024-10-06 MED ORDER — ONDANSETRON HCL 4 MG/2ML IJ SOLN
INTRAMUSCULAR | Status: AC
Start: 2024-10-06 — End: 2024-10-06
  Filled 2024-10-06: qty 2

## 2024-10-06 MED ORDER — SUCCINYLCHOLINE CHLORIDE 200 MG/10ML IV SOSY
PREFILLED_SYRINGE | INTRAVENOUS | Status: DC | PRN
  Administered 2024-10-06: 100 mg via INTRAVENOUS

## 2024-10-06 MED ORDER — POVIDONE-IODINE 10 % EX SWAB
2.0000 | Freq: Once | CUTANEOUS | Status: AC
  Administered 2024-10-06: 2 via TOPICAL

## 2024-10-06 MED ORDER — SORBITOL 70 % SOLN: 30.0000 mL | Freq: Every day | Status: DC | PRN

## 2024-10-06 MED ORDER — DEXAMETHASONE SOD PHOSPHATE PF 10 MG/ML IJ SOLN
INTRAMUSCULAR | Status: DC | PRN
  Administered 2024-10-06: 10 mg via INTRAVENOUS

## 2024-10-06 MED ORDER — POLYETHYLENE GLYCOL 3350 17 G PO PACK: 17.0000 g | PACK | Freq: Every day | ORAL | Status: DC | PRN

## 2024-10-06 MED ORDER — LIDOCAINE 2% (20 MG/ML) 5 ML SYRINGE
INTRAMUSCULAR | Status: DC | PRN
  Administered 2024-10-06: 60 mg via INTRAVENOUS

## 2024-10-06 MED ORDER — ONDANSETRON HCL 4 MG/2ML IJ SOLN
INTRAMUSCULAR | Status: DC | PRN
  Administered 2024-10-06: 4 mg via INTRAVENOUS

## 2024-10-06 MED ORDER — OXYCODONE HCL 5 MG/5ML PO SOLN: 5.0000 mg | Freq: Once | ORAL | Status: AC | PRN

## 2024-10-06 MED ORDER — PHENYLEPHRINE HCL-NACL 20-0.9 MG/250ML-% IV SOLN
INTRAVENOUS | Status: DC | PRN
  Administered 2024-10-06: 30 ug/min via INTRAVENOUS

## 2024-10-06 MED ORDER — HYDROCODONE-ACETAMINOPHEN 7.5-325 MG PO TABS
1.0000 | ORAL_TABLET | Freq: Every day | ORAL | Status: DC | PRN
  Administered 2024-10-08: 1 via ORAL
  Administered 2024-10-09 – 2024-10-10 (×3): 2 via ORAL
  Filled 2024-10-06 (×2): qty 2
  Filled 2024-10-06: qty 1
  Filled 2024-10-06: qty 2

## 2024-10-06 MED ORDER — ORAL CARE MOUTH RINSE: 15.0000 mL | Freq: Once | OROMUCOSAL | Status: AC

## 2024-10-06 MED ORDER — METHOCARBAMOL 500 MG PO TABS: 500.0000 mg | ORAL_TABLET | Freq: Three times a day (TID) | ORAL | Status: DC | PRN

## 2024-10-06 MED ORDER — MENTHOL 3 MG MT LOZG
1.0000 | LOZENGE | OROMUCOSAL | Status: DC | PRN
  Administered 2024-10-06: 3 mg via ORAL
  Filled 2024-10-06: qty 9

## 2024-10-06 MED ORDER — TRANEXAMIC ACID-NACL 1000-0.7 MG/100ML-% IV SOLN
1000.0000 mg | INTRAVENOUS | Status: AC
  Administered 2024-10-06: 1000 mg via INTRAVENOUS
  Filled 2024-10-06: qty 100

## 2024-10-06 SURGICAL SUPPLY — 37 items
BAG COUNTER SPONGE SURGICOUNT (BAG) ×1 IMPLANT
BIT DRILL INTERTAN LAG SCREW (BIT) IMPLANT
BIT DRILL LONG 4.0 (BIT) IMPLANT
BNDG COHESIVE 4X5 TAN STRL LF (GAUZE/BANDAGES/DRESSINGS) ×1 IMPLANT
BNDG COHESIVE 6X5 TAN ST LF (GAUZE/BANDAGES/DRESSINGS) IMPLANT
BNDG GAUZE DERMACEA FLUFF 4 (GAUZE/BANDAGES/DRESSINGS) ×1 IMPLANT
COVER PERINEAL POST (MISCELLANEOUS) ×1 IMPLANT
COVER SURGICAL LIGHT HANDLE (MISCELLANEOUS) ×1 IMPLANT
DRAPE C-ARMOR (DRAPES) ×1 IMPLANT
DRAPE STERI IOBAN 125X83 (DRAPES) ×1 IMPLANT
DRESSING MEPILEX FLEX 4X4 (GAUZE/BANDAGES/DRESSINGS) ×1 IMPLANT
DRSG MEPILEX POST OP 4X8 (GAUZE/BANDAGES/DRESSINGS) ×1 IMPLANT
DURAPREP 26ML APPLICATOR (WOUND CARE) ×1 IMPLANT
ELECTRODE REM PT RTRN 9FT ADLT (ELECTROSURGICAL) ×1 IMPLANT
GAUZE PAD ABD 8X10 STRL (GAUZE/BANDAGES/DRESSINGS) ×2 IMPLANT
GLOVE BIOGEL PI IND STRL 7.5 (GLOVE) ×1 IMPLANT
GLOVE ECLIPSE 7.0 STRL STRAW (GLOVE) ×4 IMPLANT
GLOVE INDICATOR 7.0 STRL GRN (GLOVE) ×1 IMPLANT
GLOVE SURG SYN 7.5 PF PI (GLOVE) ×4 IMPLANT
GOWN STRL SURGICAL XL XLNG (GOWN DISPOSABLE) ×1 IMPLANT
KIT BASIN OR (CUSTOM PROCEDURE TRAY) ×1 IMPLANT
KIT TURNOVER KIT B (KITS) ×1 IMPLANT
MANIFOLD NEPTUNE II (INSTRUMENTS) ×1 IMPLANT
NAIL TRIGEN INTERTAN 10X18CM (Nail) IMPLANT
PACK GENERAL/GYN (CUSTOM PROCEDURE TRAY) ×1 IMPLANT
PAD ARMBOARD POSITIONER FOAM (MISCELLANEOUS) ×2 IMPLANT
PAD CAST 4YDX4 CTTN HI CHSV (CAST SUPPLIES) ×2 IMPLANT
PIN GUIDE 3.2X343MM (PIN) IMPLANT
SCREW LAG COMPR KIT 85/80 (Screw) IMPLANT
SCREW TRIGEN LOW PROF 5.0X27.5 (Screw) IMPLANT
SOLN 0.9% NACL POUR BTL 1000ML (IV SOLUTION) ×1 IMPLANT
SOLN STERILE WATER BTL 1000 ML (IV SOLUTION) ×1 IMPLANT
STAPLER SKIN PROX 35W (STAPLE) ×1 IMPLANT
SUT VIC AB 0 CT1 27XBRD ANBCTR (SUTURE) ×1 IMPLANT
SUT VIC AB 2-0 CT1 TAPERPNT 27 (SUTURE) ×1 IMPLANT
TOWEL GREEN STERILE (TOWEL DISPOSABLE) ×1 IMPLANT
TOWEL GREEN STERILE FF (TOWEL DISPOSABLE) ×1 IMPLANT

## 2024-10-06 NOTE — Progress Notes (Signed)
 Progress Note   Patient: Evelyn [name] FMW:[id number] DOB: May 27, 1946 DOA: 10/05/2024     1 DOS: the patient was seen and examined on 10/06/2024   Brief hospital course: SHAKHIA GRAMAJO is a 78 y.o. female with medical history significant of A-fib, PAD, focal epilepsy, memory impairment, DVT, PE, migraines presenting after fall at home. Found to have comminuted, mildly displaced intertrochanteric right femur fracture. CT pelvis showed the same comminuted, mildly displaced intertrochanteric right femur fracture. Admitted to [facility] service with orthopedic surgery evaluation.  Assessment and Plan: Fall S/p Femur fracture Orthopedic consulted in the ED plan for surgical intervention tomorrow. As needed Norco for moderate pain, Dilaudid  for severe pain N.p.o. at midnight. Orthopedic surgery planned for this afternoon. PT/ OT as per Ortho. She will be restarted on Coumadin  after ortho clearance.   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  Priorly diagnosed with disequilibrium syndrome.  Previously followed with [facility], now follows with [facility]. Had workup in 2020 12/2020 with normal VNG and MRI brain with no ancillary findings suggesting NPH.  Follow-up CTs have shown stable ventriculomegaly. Continue to follow-up with neurology outpatient   A-fib Holding warfarin for ortho procedure.   PAD Holding warfarin as above   Focal epilepsy Continue home Lamictal    History of DVT and PE Hold warfarin until after surgery.     Out of bed to chair. Incentive spirometry. Nursing supportive care. Fall, aspiration precautions. Diet:  Diet Orders (From admission, onward)     Start     Ordered   10/06/24 1536  Diet Heart Room service appropriate? Yes; Fluid consistency: Thin  Diet effective now       Question Answer Comment  Room service appropriate? Yes    Fluid consistency: Thin      10/06/24 1535           DVT prophylaxis: SCDs Start: 10/06/24 1536 Place TED hose Start: 10/06/24 1536 SCDs Start: 10/05/24 1734  Level of care: Med-Surg   Code Status: Full Code  Subjective: Patient is seen and examined today morning. She is lying in bed. Right hip pain more with movement. Ortho surgery planned for this afternoon.  Physical Exam: Vitals:   10/06/24 1500 10/06/24 1515 10/06/24 1534 10/06/24 1754  BP: (!) 141/70 (!) 151/74 (!) 159/66 (!) 157/62  Pulse: 77 84 76 82  Resp: 19 20 19 18   Temp:  (!) 97.5 F (36.4 C) 98.2 F (36.8 C) 98.6 F (37 C)  TempSrc:   Oral Oral  SpO2: 97% 95% 98% 97%  Weight:      Height:        General - Elderly Caucasian female, distress due to pain. HEENT - PERRLA, EOMI, atraumatic head, non tender sinuses. Lung - Clear, no rales, rhonchi, wheezes. Heart - S1, S2 heard, no murmurs, rubs, trace pedal edema. Abdomen - Soft, non tender, bowel sounds good Neuro - Alert, awake and oriented x 3, non focal exam. Skin - Warm and dry.  Data Reviewed:      Latest Ref Rng & Units 10/06/2024   12:46 AM 10/05/2024    4:45 PM 10/05/2024    4:22 PM  CBC  WBC 4.0 - 10.5 K/uL 12.7   15.7   Hemoglobin 12.0 - 15.0 g/dL 89.4  88.0  88.6   Hematocrit 36.0 - 46.0 % 33.1  35.0  36.0   Platelets 150 - 400 K/uL 283   297  Latest Ref Rng & Units 10/06/2024   12:46 AM 10/05/2024    4:45 PM 10/05/2024    4:22 PM  BMP  Glucose 70 - 99 mg/dL 876  884  884   BUN 8 - 23 mg/dL 21  24  20    Creatinine 0.44 - 1.00 mg/dL 9.28  9.29  9.21   Sodium 135 - 145 mmol/L 135  138  137   Potassium 3.5 - 5.1 mmol/L 4.0  4.2  4.1   Chloride 98 - 111 mmol/L 101  106  100   CO2 22 - 32 mmol/L 23   24   Calcium 8.9 - 10.3 mg/dL 8.7   9.2    DG HIP UNILAT WITH PELVIS 2-3 VIEWS RIGHT Result Date: 10/06/2024 CLINICAL DATA:  Elective surgery. EXAM: DG HIP (WITH OR WITHOUT PELVIS) 2-3V*R* COMPARISON:  Preoperative imaging  FINDINGS: Six fluoroscopic spot views of the right hip submitted from the operating room. Femoral intramedullary nail with trans trochanteric and distal locking screw fixation traverse proximal femur fracture. Fluoroscopy time 78.3 seconds. Dose 23.5 mGy. IMPRESSION: Intraoperative fluoroscopy during right proximal femur fracture ORIF. Electronically Signed   By: Andrea Gasman M.D.   On: 10/06/2024 16:42   DG C-Arm 1-60 Min-No Report Result Date: 10/06/2024 Fluoroscopy was utilized by the requesting physician.  No radiographic interpretation.   DG FEMUR PORT, MIN 2 VIEWS RIGHT Result Date: 10/05/2024 CLINICAL DATA:  Fall.  Right hip pain. EXAM: RIGHT FEMUR PORTABLE 2 VIEW COMPARISON:  Pelvic CT dated 10/05/2024 FINDINGS: Comminuted and mildly displaced intertrochanteric fracture of the right femur. No dislocation. The bones are osteopenic. The soft tissues are unremarkable. IMPRESSION: Comminuted and mildly displaced intertrochanteric fracture of the right femur. Electronically Signed   By: Vanetta Chou M.D.   On: 10/05/2024 17:18   CT Head Wo Contrast Result Date: 10/05/2024 EXAM: CT HEAD WITHOUT CONTRAST 10/05/2024 03:49:14 PM TECHNIQUE: CT of the head was performed without the administration of intravenous contrast. Automated exposure control, iterative reconstruction, and/or weight based adjustment of the mA/kV was utilized to reduce the radiation dose to as low as reasonably achievable. Examination is somewhat limited due to motion artifact. COMPARISON: 2017. CLINICAL HISTORY: Head trauma, minor (Age >= 65y). Trauma pt, altered and not holding still. FINDINGS: BRAIN AND VENTRICLES: No acute hemorrhage. No evidence of acute infarct. Prominence of the lateral ventricles at a proportion to the degree of sulcal enlargement. There is a borderline callosal angle. Evans index measures approximately 0.42. No extra-axial collection. No mass effect or midline shift. Mild chronic microvascular ischemic  changes. ORBITS: No acute abnormality. SINUSES: No acute abnormality. SOFT TISSUES AND SKULL: No acute soft tissue abnormality. No skull fracture. IMPRESSION: 1. No acute intracranial abnormality. 2. Prominence of the lateral ventricles which could reflect normal pressure hydrocephalus in the appropriate clinical context. 3. Mild chronic microvascular ischemic changes. Electronically signed by: Donnice Mania MD 10/05/2024 04:01 PM EDT RP Workstation: [id number]   CT PELVIS WO CONTRAST Result Date: 10/05/2024 CLINICAL DATA:  Trauma. EXAM: CT PELVIS WITHOUT CONTRAST TECHNIQUE: Multidetector CT imaging of the pelvis was performed following the standard protocol without intravenous contrast. RADIATION DOSE REDUCTION: This exam was performed according to the departmental dose-optimization program which includes automated exposure control, adjustment of the mA and/or kV according to patient size and/or use of iterative reconstruction technique. COMPARISON:  CT abdomen pelvis dated 10/08/2009. FINDINGS: Urinary Tract:  The urinary bladder is unremarkable. Bowel:  No dilated bowel loops.  The appendix is normal. Vascular/Lymphatic: Moderate aortoiliac  atherosclerotic disease. No pelvic adenopathy. Reproductive: The uterus is grossly unremarkable. No suspicious adnexal masses. Other:  None Musculoskeletal: Comminuted and mildly displaced intertrochanteric fracture of the right femur with displaced fracture fragments of the greater and lesser trochanters. No dislocation. The bones are osteopenic. IMPRESSION: 1. Comminuted and mildly displaced intertrochanteric fracture of the right femur. 2.  Aortic Atherosclerosis (ICD10-I70.0). Electronically Signed   By: Vanetta Chou M.D.   On: 10/05/2024 15:59    Family Communication: Discussed with patient, husband at bedside. They understand and agree. All questions answered.  Disposition: Status is: Inpatient Remains inpatient appropriate because: ORIF planned  Planned  Discharge Destination: Home with Home Health     Time spent: 44 minutes  Author: Concepcion Riser, MD 10/06/2024 6:37 PM Secure chat 7am to 7pm For on call review [email or web address].

## 2024-10-06 NOTE — Plan of Care (Signed)

## 2024-10-06 NOTE — TOC CAGE-AID Note (Signed)
 Transition of Care [facility]) - CAGE-AID Screening   Patient Details  Name: Evelyn [name] MRN: [id number] Date of Birth: Apr 09, 1946  Transition of Care [facility]) CM/SW Contact:    Zarinah Oviatt E Winni Ehrhard, LCSW Phone Number: 10/06/2024, 8:56 AM   Clinical Narrative: No SA noted.   CAGE-AID Screening:    Have You Ever Felt You Ought to Cut Down on Your Drinking or Drug Use?: No Have People Annoyed You By Critizing Your Drinking Or Drug Use?: No Have You Felt Bad Or Guilty About Your Drinking Or Drug Use?: No Have You Ever Had a Drink or Used Drugs First Thing In The Morning to Steady Your Nerves or to Get Rid of a Hangover?: No CAGE-AID Score: 0  Substance Abuse Education Offered: No

## 2024-10-06 NOTE — Anesthesia Postprocedure Evaluation (Signed)
 Anesthesia Post Note  Patient: Evelyn [name]  Procedure(s) Performed: FIXATION, FRACTURE, INTERTROCHANTERIC, WITH INTRAMEDULLARY ROD (Right)     Patient location during evaluation: PACU Anesthesia Type: General Level of consciousness: awake and alert, oriented and patient cooperative Pain management: pain level controlled Vital Signs Assessment: post-procedure vital signs reviewed and stable Respiratory status: spontaneous breathing, nonlabored ventilation and respiratory function stable Cardiovascular status: blood pressure returned to baseline and stable Postop Assessment: no apparent nausea or vomiting Anesthetic complications: no   No notable events documented.  Last Vitals:  Vitals:   10/06/24 1445 10/06/24 1500  BP: (!) 112/95 (!) 141/70  Pulse: 75 77  Resp: 17 19  Temp:  (!) 36.4 C  SpO2: 94% 97%    Last Pain:  Vitals:   10/06/24 1428  TempSrc:   PainSc: 10-Worst pain ever                 [name]

## 2024-10-06 NOTE — H&P (Signed)

## 2024-10-06 NOTE — Anesthesia Preprocedure Evaluation (Addendum)
 Anesthesia Evaluation  Patient identified by MRN, date of birth, ID band Patient awake    Reviewed: Allergy & Precautions, NPO status , Patient's Chart, lab work & pertinent test results  Airway Mallampati: II  TM Distance: >3 FB Neck ROM: Full    Dental no notable dental hx.    Pulmonary PE (coumadin  LD yesterday)   Pulmonary exam normal breath sounds clear to auscultation       Cardiovascular hypertension (145/61 preop), + Peripheral Vascular Disease and + DVT  Normal cardiovascular exam+ dysrhythmias Atrial Fibrillation  Rhythm:Regular Rate:Normal  TTE 08/2024  1. Left ventricular ejection fraction, by estimation, is 60 to 65%. The  left ventricle has normal function. The left ventricle has no regional  wall motion abnormalities. Left ventricular diastolic parameters were  normal.   2. Right ventricular systolic function is normal. The right ventricular  size is normal. There is normal pulmonary artery systolic pressure. The  estimated right ventricular systolic pressure is 21.3 mmHg.   3. The tips of both papillary muscles are heavily calcified. The mitral  valve is degenerative. Mild to moderate mitral valve regurgitation.  Moderate mitral annular calcification.   4. The aortic valve is tricuspid. There is mild calcification of the  aortic valve. Aortic valve regurgitation is not visualized. Aortic valve  sclerosis/calcification is present, without any evidence of aortic  stenosis.   5. The inferior vena cava is normal in size with greater than 50%  respiratory variability, suggesting right atrial pressure of 3 mmHg     Neuro/Psych  Headaches, Seizures - (lamictal ),   negative psych ROS   GI/Hepatic negative GI ROS, Neg liver ROS,,,  Endo/Other  negative endocrine ROS    Renal/GU negative Renal ROS  negative genitourinary   Musculoskeletal negative musculoskeletal ROS (+)    Abdominal   Peds  Hematology  (+)  Blood dyscrasia (coumadin ) Hb 10.5, plt 283   Anesthesia Other Findings 78 y.o. female with medical history significant of A-fib, PAD, focal epilepsy, memory impairment, DVT, PE, migraines presenting after fall at home.  Reproductive/Obstetrics negative OB ROS                              Anesthesia Physical Anesthesia Plan  ASA: 3  Anesthesia Plan: General   Post-op Pain Management: Tylenol  PO (pre-op)*   Induction: Intravenous  PONV Risk Score and Plan: 3 and Dexamethasone , Ondansetron  and Treatment may vary due to age or medical condition  Airway Management Planned: Oral ETT  Additional Equipment:   Intra-op Plan:   Post-operative Plan: Extubation in OR  Informed Consent: I have reviewed the patients History and Physical, chart, labs and discussed the procedure including the risks, benefits and alternatives for the proposed anesthesia with the patient or authorized representative who has indicated his/her understanding and acceptance.     Dental advisory given  Plan Discussed with: CRNA  Anesthesia Plan Comments:          Anesthesia Quick Evaluation

## 2024-10-06 NOTE — Discharge Instructions (Signed)
 Postoperative instructions:  Weightbearing instructions: weight bearing as tolerated  Keep your dressing and/or splint clean and dry at all times.  You can remove your dressing on post-operative day #3 and change with a dry/sterile dressing or Band-Aids as needed thereafter.    Incision instructions:  Do not soak your incision for 3 weeks after surgery.  If the incision gets wet, pat dry and do not scrub the incision.  Pain control:  You have been given a prescription to be taken as directed for post-operative pain control.  In addition, elevate the operative extremity above the heart at all times to prevent swelling and throbbing pain.  Take over-the-counter Colace, 100mg  by mouth twice a day while taking narcotic pain medications to help prevent constipation.  Follow up appointments: 1) 14 days for suture removal and wound check. 2) Dr. Jerri or Morna Grave, PA as scheduled.   -------------------------------------------------------------------------------------------------------------  After Surgery Pain Control:  After your surgery, post-surgical discomfort or pain is likely. This discomfort can last several days to a few weeks. At certain times of the day your discomfort may be more intense.  Did you receive a nerve block?  A nerve block can provide pain relief for one hour to two days after your surgery. As long as the nerve block is working, you will experience little or no sensation in the area the surgeon operated on.  As the nerve block wears off, you will begin to experience pain or discomfort. It is very important that you begin taking your prescribed pain medication before the nerve block fully wears off. Treating your pain at the first sign of the block wearing off will ensure your pain is better controlled and more tolerable when full-sensation returns. Do not wait until the pain is intolerable, as the medicine will be less effective. It is better to treat pain in advance  than to try and catch up.  General Anesthesia:  If you did not receive a nerve block during your surgery, you will need to start taking your pain medication shortly after your surgery and should continue to do so as prescribed by your surgeon.  Pain Medication:  Most commonly we prescribe Vicodin and Percocet for post-operative pain. Both of these medications contain a combination of acetaminophen  (Tylenol ) and a narcotic to help control pain.   It takes between 30 and 45 minutes before pain medication starts to work. It is important to take your medication before your pain level gets too intense.   Nausea is a common side effect of many pain medications. You will want to eat something before taking your pain medicine to help prevent nausea.   If you are taking a prescription pain medication that contains acetaminophen , we recommend that you do not take additional over the counter acetaminophen  (Tylenol ).  Other pain relieving options:   Using a cold pack to ice the affected area a few times a day (15 to 20 minutes at a time) can help to relieve pain, reduce swelling and bruising.   Elevation of the affected area can also help to reduce pain and swelling.  Per [facility] clinic policy, our goal is ensure optimal postoperative pain control with a multimodal pain management strategy. For all [facility] patients, our goal is to wean post-operative narcotic medications by 6 weeks post-operatively. If this is not possible due to utilization of pain medication prior to surgery, your [facility] doctor will support your acute post-operative pain control for the first 6 weeks postoperatively, with a plan to  transition you back to your primary pain team following that. Maralee will work to ensure a Therapist, occupational.

## 2024-10-06 NOTE — Anesthesia Procedure Notes (Signed)
 Procedure Name: Intubation Date/Time: 10/06/2024 12:37 PM  Performed by: Emmitt Millman, CRNAPre-anesthesia Checklist: Patient identified, Emergency Drugs available, Suction available and Patient being monitored Patient Re-evaluated:Patient Re-evaluated prior to induction Oxygen Delivery Method: Circle system utilized Preoxygenation: Pre-oxygenation with 100% oxygen Induction Type: IV induction, Rapid sequence and Cricoid Pressure applied Laryngoscope Size: Mac and 3 Grade View: Grade I Tube type: Oral Tube size: 7.0 mm Number of attempts: 1 Airway Equipment and Method: Stylet Placement Confirmation: ETT inserted through vocal cords under direct vision, positive ETCO2 and breath sounds checked- equal and bilateral Secured at: 22 cm Tube secured with: Tape Dental Injury: Teeth and Oropharynx as per pre-operative assessment

## 2024-10-06 NOTE — Transfer of Care (Signed)
 Immediate Anesthesia Transfer of Care Note  Patient: Evelyn [name]  Procedure(s) Performed: FIXATION, FRACTURE, INTERTROCHANTERIC, WITH INTRAMEDULLARY ROD (Right)  Patient Location: PACU  Anesthesia Type:General  Level of Consciousness: alert   Airway & Oxygen Therapy: Patient Spontanous Breathing and Patient connected to face mask oxygen  Post-op Assessment: Report given to RN and Post -op Vital signs reviewed and stable  Post vital signs: Reviewed and stable  Last Vitals:  Vitals Value Taken Time  BP 145/53 10/06/24 13:48  Temp    Pulse 79 10/06/24 13:50  Resp 22 10/06/24 13:50  SpO2 93 % 10/06/24 13:50  Vitals shown include unfiled device data.  Last Pain:  Vitals:   10/06/24 1159  TempSrc:   PainSc: 0-No pain         Complications: No notable events documented.

## 2024-10-06 NOTE — Progress Notes (Signed)
 Multiple attempts to get report from the floor nurse with no results.

## 2024-10-06 NOTE — Op Note (Signed)
 Date of Surgery: 10/06/2024  INDICATIONS: Evelyn [name] is a 78 y.o.-year-old female who sustained a right hip fracture. The risks and benefits of the procedure discussed with the patient prior to the procedure and all questions were answered; consent was obtained.  PREOPERATIVE DIAGNOSIS: right intertrochanteric hip fracture   POSTOPERATIVE DIAGNOSIS: Same   PROCEDURE: Open treatment of intertrochanteric, pertrochanteric, subtrochanteric fracture with intramedullary implant. CPT [phone]   SURGEON: N. Ozell [name], M.D.   ASSIST: None  ANESTHESIA: general   IV FLUIDS AND URINE: See anesthesia record   ESTIMATED BLOOD LOSS: 100 cc  IMPLANTS: [employer] InterTAN 10 x 180, 85/80 lag screws  DRAINS: None.   COMPLICATIONS: see description of procedure.   DESCRIPTION OF PROCEDURE: The patient was brought to the operating room and placed supine on the operating table. The patient's leg had been signed prior to the procedure. The patient had the anesthesia placed by the anesthesiologist. The prep verification and incision time-outs were performed to confirm that this was the correct patient, site, side and location. The patient had an SCD on the opposite lower extremity. The patient did receive antibiotics prior to the incision and was re-dosed during the procedure as needed at indicated intervals. The patient was positioned on the fracture table with the table in traction and internal rotation to reduce the hip. The well leg was placed in a scissor position and all bony prominences were well-padded. The patient had the lower extremity prepped and draped in the standard surgical fashion. The incision was made 4 finger breadths superior to the greater trochanter. A guide pin was inserted into the tip of the greater trochanter under fluoroscopic guidance. An opening reamer was used to gain access to the femoral canal. The nail was inserted down the femoral canal to its proper depth. The  appropriate version of insertion for the lag screw was found under fluoroscopy. A pin was inserted up the femoral neck through the jig.  The length of the lag screw was then measured.  Sequential reamers were used.  Antirotation bar was inserted.  The lag screw was inserted as near to center-center in the head as possible. The antirotation bar was then taken out and an interdigitating compression screw was placed in its place. The leg was taken out of traction, then the interdigitating compression screw was used to compress across the fracture. Compression was visualized on serial xrays.  The set screw was tightened and then backed off half a turn.  A distal interlocking screw was placed through the static slot of the jig.  The wounds were copiously irrigated with saline and the subcutaneous layer closed with 2.0 vicryl and the skin was reapproximated with staples. The wounds were cleaned and dried a final time and a sterile dressing was placed. The hip was taken through a range of motion at the end of the case under fluoroscopic imaging to visualize the approach-withdraw phenomenon and confirm implant length in the head. The patient was then awakened from anesthesia and taken to the recovery room in stable condition. All counts were correct at the end of the case.   Evelyn [name] was necessary for opening, closing, retracting, limb positioning and overall facilitation and completion of the surgery.  POSTOPERATIVE PLAN: The patient will be weight bearing as tolerated and will return in 2 weeks for staple removal and the patient will receive DVT prophylaxis based on other medications, activity level, and risk ratio of bleeding to thrombosis.   Evelyn Ozell Cummins, MD  [facility] 1:42 PM

## 2024-10-07 DIAGNOSIS — R413 Other amnesia: Secondary | ICD-10-CM | POA: Diagnosis not present

## 2024-10-07 DIAGNOSIS — S72141A Displaced intertrochanteric fracture of right femur, initial encounter for closed fracture: Secondary | ICD-10-CM | POA: Diagnosis not present

## 2024-10-07 DIAGNOSIS — Z86711 Personal history of pulmonary embolism: Secondary | ICD-10-CM | POA: Diagnosis not present

## 2024-10-07 DIAGNOSIS — Z86718 Personal history of other venous thrombosis and embolism: Secondary | ICD-10-CM | POA: Diagnosis not present

## 2024-10-07 LAB — CBC
HCT: 30.6 % — ABNORMAL LOW (ref 36.0–46.0)
Hemoglobin: 9.8 g/dL — ABNORMAL LOW (ref 12.0–15.0)
MCH: 27.9 pg (ref 26.0–34.0)
MCHC: 32 g/dL (ref 30.0–36.0)
MCV: 87.2 fL (ref 80.0–100.0)
Platelets: 216 K/uL (ref 150–400)
RBC: 3.51 MIL/uL — ABNORMAL LOW (ref 3.87–5.11)
RDW: 14.6 % (ref 11.5–15.5)
WBC: 10.7 K/uL — ABNORMAL HIGH (ref 4.0–10.5)
nRBC: 0 % (ref 0.0–0.2)

## 2024-10-07 LAB — PROTIME-INR
INR: 1.1 (ref 0.8–1.2)
Prothrombin Time: 14.8 s (ref 11.4–15.2)

## 2024-10-07 MED ORDER — WARFARIN SODIUM 5 MG PO TABS
5.0000 mg | ORAL_TABLET | Freq: Once | ORAL | Status: AC
  Administered 2024-10-07: 5 mg via ORAL
  Filled 2024-10-07: qty 1

## 2024-10-07 MED ORDER — ENOXAPARIN SODIUM 40 MG/0.4ML IJ SOSY
40.0000 mg | PREFILLED_SYRINGE | INTRAMUSCULAR | Status: DC
  Administered 2024-10-07 – 2024-10-09 (×3): 40 mg via SUBCUTANEOUS
  Filled 2024-10-07 (×3): qty 0.4

## 2024-10-07 MED ORDER — WARFARIN - PHARMACIST DOSING INPATIENT: Freq: Every day | Status: DC

## 2024-10-07 NOTE — Progress Notes (Addendum)
   Subjective:  Patient reports pain as much improved. More alert and pleasant this morning.  Today's  total administered Morphine  Milligram Equivalents: 3  Objective:   VITALS:   Vitals:   10/07/24 0024 10/07/24 0514 10/07/24 1141 10/07/24 1547  BP: (!) 120/51 (!) 155/58 123/61 (!) 130/57  Pulse: 82 89 95 95  Resp:   18 18  Temp: 98.3 F (36.8 C) (!) 97.5 F (36.4 C) 97.8 F (36.6 C) (!) 97.1 F (36.2 C)  TempSrc: Oral Oral Oral   SpO2: 96% 100% 98% 95%  Weight:      Height:        Sensation intact distally Intact pulses distally Dorsiflexion/Plantar flexion intact   Lab Results  Component Value Date   WBC 10.7 (H) 10/07/2024   HGB 9.8 (L) 10/07/2024   HCT 30.6 (L) 10/07/2024   MCV 87.2 10/07/2024   PLT 216 10/07/2024     Assessment/Plan:  1 Day Post-Op   - Expected postop acute blood loss anemia - Up with PT/OT - DVT ppx - SCDs, ambulation, resume warfarin, will start lovenox  bridge - WBAT operative extremity - Pain control - Discharge planning - will determine over the next few days  Evelyn [name] 10/07/2024, 4:43 PM

## 2024-10-07 NOTE — Progress Notes (Signed)
 PHARMACY - ANTICOAGULATION CONSULT NOTE  Pharmacy Consult for warfarin Indication: atrial fibrillation/Hx VTE  Allergies  Allergen Reactions   Tegretol [Carbamazepine] Rash   Patient Measurements: Height: 5' 5 (165.1 cm) Weight: 78.2 kg (172 lb 6.4 oz) IBW/kg (Calculated) : 57 HEPARIN DW (KG): 73.3  Vital Signs: Temp: 97.5 F (36.4 C) (10/25 0514) Temp Source: Oral (10/25 0514) BP: 155/58 (10/25 0514) Pulse Rate: 89 (10/25 0514)  Labs: Recent Labs    10/05/24 1555 10/05/24 1622 10/05/24 1622 10/05/24 1645 10/06/24 0046 10/07/24 0156  HGB  --  11.3*   < > 11.9* 10.5* 9.8*  HCT  --  36.0   < > 35.0* 33.1* 30.6*  PLT  --  297  --   --  283 216  APTT 28  --   --   --   --   --   LABPROT 19.3*  --   --   --  16.8* 14.8  INR 1.5*  --   --   --  1.3* 1.1  CREATININE  --  0.78  --  0.70 0.71  --    < > = values in this interval not displayed.   Estimated Creatinine Clearance: 60.9 mL/min (by C-G formula based on SCr of 0.71 mg/dL).  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, but unable to be removed, being treated by Dr Darcy at [facility]   Elevated troponin 08/22/2024   Headache(784.0)    History of echocardiogram 03/11/2009   nl EF 60-65%   History of recurrent UTIs    feels like may have one now   PE (pulmonary embolism) 12/15/2003   Peripheral vascular disease    dvt leg rt   Seizures (HCC)     Medications:  Scheduled:   acetaminophen   500 mg Oral Q6H   docusate sodium  100 mg Oral BID   lamoTRIgine   200 mg Oral BID   lamoTRIgine    50 mg Oral BID   mirtazapine   3.75-7.5 mg Oral QHS   Infusions:   sodium chloride  75 mL/hr at 10/07/24 9370   Assessment: Patient is a 66 YOF admitted for right femur fracture s/p OR intervention by orthopedics 10/24. Patient on warfarin PTA for Hx VTE/afib. Last dose warfarin 10/23. Initial INR on presentation 10/23 was 1.5. Patient's INR was previously therapeutic at INR 2.0 x2 visits on regimen of warfarin 7.5mg  tab, 1 tablet daily except for 1/2 tablet on Saturdays and Sundays (total weekly dose 45 mg). Pharmacy consulted to dose warfarin; ok to restart per orthopedics.   Hgb mostly stable 10.5 > 9.8; plt WNL. No bridging per orthopedics.   Goal of Therapy:  INR 2-3 Monitor platelets by anticoagulation protocol: Yes   Plan:  -Give warfarin 5mg  x1 today. -Monitor INR, CBC daily.   Maurilio Patten, PharmD PGY1 Pharmacy Resident [facility] 10/07/2024 8:16 AM

## 2024-10-07 NOTE — Evaluation (Signed)
 Occupational Therapy Evaluation Patient Details Name: Evelyn [name] MRN: [id number] DOB: [birth year] Today's Date: 10/07/2024   History of Present Illness   78 yo with displaced intertrochanteric fracture of the R femur after a fall. underwent surgical repair 10/06/24 with no restrictions / wbat.  PMH includes a-fib, PAD, focal epilepsy, memory impairment , DVT, PE and disequalibrium syndrome     Clinical Impressions Prior to admission patient was independent with with adls and iadls.  She has a history of memory impairment and disequilibrium syndrome.  During the evaluation patient had difficulty with motor planning and needed moderate cues for hand placement for sit to stand activity.  Following one step commands with mobility / adls with repetition.  Currently max a with lowerbody adls and transitional movement.  Patient will benefit from continued inpatient follow up therapy, <3 hours/day  and will benefit form OT for adl training.      If plan is discharge home, recommend the following:   A lot of help with walking and/or transfers;A lot of help with bathing/dressing/bathroom;Direct supervision/assist for medications management;Assist for transportation;Help with stairs or ramp for entrance     Functional Status Assessment   Patient has had a recent decline in their functional status and demonstrates the ability to make significant improvements in function in a reasonable and predictable amount of time.     Equipment Recommendations   Other (comment) (TBA)     Recommendations for Other Services         Precautions/Restrictions   Precautions Precautions: Fall Recall of Precautions/Restrictions: Intact Precaution/Restrictions Comments: WBAT Restrictions Weight Bearing Restrictions Per Provider Order: No Other Position/Activity Restrictions: No restrictions     Mobility Bed Mobility               General bed mobility comments: pt in chair upon  arrival    Transfers Overall transfer level: Needs assistance Equipment used: Rolling walker (2 wheels) Transfers: Sit to/from Stand                    Balance Overall balance assessment: Needs assistance Sitting-balance support: Feet supported Sitting balance-Leahy Scale: Fair                                     ADL either performed or assessed with clinical judgement   ADL Overall ADL's : Needs assistance/impaired Eating/Feeding: Set up   Grooming: Wash/dry hands;Oral care;Set up;Sitting   Upper Body Bathing: Minimal assistance;Sitting   Lower Body Bathing: Maximal assistance;Sit to/from stand   Upper Body Dressing : Minimal assistance;Sitting   Lower Body Dressing: Maximal assistance;Sit to/from stand   Toilet Transfer: Maximal assistance;BSC/3in1   Toileting- Clothing Manipulation and Hygiene: Maximal assistance;Sit to/from stand       Functional mobility during ADLs: Maximal assistance General ADL Comments: decreased ability to reach BLE/ feet; decreased motor planning; decreased ability with transitional movements     Vision Baseline Vision/History: 0 No visual deficits Vision Assessment?: No apparent visual deficits     Perception Perception: Within Functional Limits       Praxis Praxis: Not tested       Pertinent Vitals/Pain Pain Assessment Pain Assessment: 0-10 Pain Score: 4  Pain Location: Right hip Pain Descriptors / Indicators: Discomfort, Grimacing     Extremity/Trunk Assessment Upper Extremity Assessment Upper Extremity Assessment: Overall WFL for tasks assessed   Lower Extremity Assessment Lower Extremity Assessment: Defer to PT evaluation  Cervical / Trunk Assessment Cervical / Trunk Assessment: Normal   Communication Communication Communication: No apparent difficulties   Cognition Arousal: Alert Behavior During Therapy: WFL for tasks assessed/performed Cognition: History of cognitive impairments              OT - Cognition Comments: patient demonstrated difficulty with motor planning sit to stand and also demonstrates memory impairment                 Following commands: Impaired Following commands impaired: Only follows one step commands consistently (needing cues with seqence of sit to stand)     Cueing  General Comments   Cueing Techniques: Verbal cues;Tactile cues  Husband present for assessment   Exercises     Shoulder Instructions      Home Living Family/patient expects to be discharged to:: Private residence Living Arrangements: Spouse/significant other Available Help at Discharge: Family Type of Home: House Home Access: Stairs to enter Secretary/administrator of Steps: 5   Home Layout: One level     Bathroom Shower/Tub: Producer, Television/film/video: Standard Bathroom Accessibility: Yes   Home Equipment: Rollator (4 wheels)          Prior Functioning/Environment Prior Level of Function : Independent/Modified Independent;History of Falls (last six months)                    OT Problem List: Decreased strength;Decreased activity tolerance;Impaired balance (sitting and/or standing);Decreased cognition;Pain   OT Treatment/Interventions:        OT Goals(Current goals can be found in the care plan section)   Acute Rehab OT Goals Patient Stated Goal: To not have pain OT Goal Formulation: With patient/family Time For Goal Achievement: 10/21/24 Potential to Achieve Goals: Good   OT Frequency:  Min 2X/week    Co-evaluation              AM-PAC OT 6 Clicks Daily Activity     Outcome Measure Help from another person eating meals?: None Help from another person taking care of personal grooming?: A Little Help from another person toileting, which includes using toliet, bedpan, or urinal?: A Lot Help from another person bathing (including washing, rinsing, drying)?: A Lot Help from another person to put on and taking off  regular upper body clothing?: A Little Help from another person to put on and taking off regular lower body clothing?: A Lot 6 Click Score: 16   End of Session Equipment Utilized During Treatment: Gait belt;Rolling walker (2 wheels) Nurse Communication: Mobility status  Activity Tolerance: Patient limited by pain Patient left: in chair;with call bell/phone within reach;with chair alarm set;with family/visitor present  OT Visit Diagnosis: Unsteadiness on feet (R26.81);Muscle weakness (generalized) (M62.81);Other symptoms and signs involving cognitive function;Pain                Time: 8892-8864 OT Time Calculation (min): 28 min Charges:  OT General Charges $OT Visit: 1 Visit OT Evaluation $OT Eval Moderate Complexity: 1 Mod OT Treatments $Self Care/Home Management : 8-22 mins  Inocente Browner OTR/L   Inocente SHAUNNA Browner 10/07/2024, 12:08 PM

## 2024-10-07 NOTE — Progress Notes (Signed)
 Progress Note   Patient: Evelyn [name] FMW:[id number] DOB: [birth year] DOA: 10/05/2024     2 DOS: the patient was seen and examined on 10/07/2024   Brief hospital course: AINSLEY DEAKINS is a 78 y.o. female with medical history significant of A-fib, PAD, focal epilepsy, memory impairment, DVT, PE, migraines presenting after fall at home. Found to have comminuted, mildly displaced intertrochanteric right femur fracture. CT pelvis showed the same comminuted, mildly displaced intertrochanteric right femur fracture. Admitted to [facility] service with orthopedic surgery evaluation.  Assessment and Plan: Fall S/p Femur fracture S/p ORIF 10/06/24. Continue pain control. Weight bearing as tolerated and will follow with ortho in 2 weeks for staple removal. PT/ OT for dc planning. Coumadin  restarted from tonight.   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  Priorly diagnosed with disequilibrium syndrome.  Previously followed with [facility], now follows with [facility]. Had workup in 2020 12/2020 with normal VNG and MRI brain with no ancillary findings suggesting NPH.  Follow-up CTs have shown stable ventriculomegaly. Continue to follow-up with neurology outpatient   A-fib Holding warfarin for ortho procedure.   PAD Holding warfarin as above   Focal epilepsy Continue home Lamictal    History of DVT and PE Hold warfarin until after surgery.     Out of bed to chair. Incentive spirometry. Nursing supportive care. Fall, aspiration precautions. Diet:  Diet Orders (From admission, onward)     Start     Ordered   10/06/24 1536  Diet Heart Room service appropriate? Yes; Fluid consistency: Thin  Diet effective now       Question Answer Comment  Room service appropriate? Yes   Fluid consistency: Thin      10/06/24 1535           DVT prophylaxis: SCDs Start:  10/06/24 1536 Place TED hose Start: 10/06/24 1536 SCDs Start: 10/05/24 1734 warfarin (COUMADIN ) tablet 5 mg  Level of care: Med-Surg   Code Status: Full Code  Subjective: Patient is seen and examined today morning. She is sitting in chair. Has some right hip pain. Husband at bedside. She is working with PT/ OT.SABRA  Physical Exam: Vitals:   10/06/24 2132 10/07/24 0024 10/07/24 0514 10/07/24 1141  BP: (!) 130/55 (!) 120/51 (!) 155/58 123/61  Pulse: 87 82 89 95  Resp:    18  Temp: 98.2 F (36.8 C) 98.3 F (36.8 C) (!) 97.5 F (36.4 C) 97.8 F (36.6 C)  TempSrc: Oral Oral Oral Oral  SpO2: 95% 96% 100% 98%  Weight:      Height:        General - Elderly Caucasian female, mild distress due to pain. HEENT - PERRLA, EOMI, atraumatic head, non tender sinuses. Lung - Clear, no rales, rhonchi, wheezes. Heart - S1, S2 heard, no murmurs, rubs, trace pedal edema. Abdomen - Soft, non tender, bowel sounds good Neuro - Alert, awake and oriented x 3, non focal exam. Skin - Warm and dry.  Data Reviewed:      Latest Ref Rng & Units 10/07/2024    1:56 AM 10/06/2024   12:46 AM 10/05/2024    4:45 PM  CBC  WBC 4.0 - 10.5 K/uL 10.7  12.7    Hemoglobin 12.0 - 15.0 g/dL 9.8  89.4  88.0   Hematocrit 36.0 - 46.0 % 30.6  33.1  35.0   Platelets 150 - 400 K/uL 216  283  Latest Ref Rng & Units 10/06/2024   12:46 AM 10/05/2024    4:45 PM 10/05/2024    4:22 PM  BMP  Glucose 70 - 99 mg/dL 876  884  884   BUN 8 - 23 mg/dL 21  24  20    Creatinine 0.44 - 1.00 mg/dL 9.28  9.29  9.21   Sodium 135 - 145 mmol/L 135  138  137   Potassium 3.5 - 5.1 mmol/L 4.0  4.2  4.1   Chloride 98 - 111 mmol/L 101  106  100   CO2 22 - 32 mmol/L 23   24   Calcium 8.9 - 10.3 mg/dL 8.7   9.2    DG HIP UNILAT WITH PELVIS 2-3 VIEWS RIGHT Result Date: 10/06/2024 CLINICAL DATA:  Elective surgery. EXAM: DG HIP (WITH OR WITHOUT PELVIS) 2-3V*R* COMPARISON:  Preoperative imaging FINDINGS: Six fluoroscopic spot views of  the right hip submitted from the operating room. Femoral intramedullary nail with trans trochanteric and distal locking screw fixation traverse proximal femur fracture. Fluoroscopy time 78.3 seconds. Dose 23.5 mGy. IMPRESSION: Intraoperative fluoroscopy during right proximal femur fracture ORIF. Electronically Signed   By: Andrea Gasman M.D.   On: 10/06/2024 16:42   DG C-Arm 1-60 Min-No Report Result Date: 10/06/2024 Fluoroscopy was utilized by the requesting physician.  No radiographic interpretation.   DG FEMUR PORT, MIN 2 VIEWS RIGHT Result Date: 10/05/2024 CLINICAL DATA:  Fall.  Right hip pain. EXAM: RIGHT FEMUR PORTABLE 2 VIEW COMPARISON:  Pelvic CT dated 10/05/2024 FINDINGS: Comminuted and mildly displaced intertrochanteric fracture of the right femur. No dislocation. The bones are osteopenic. The soft tissues are unremarkable. IMPRESSION: Comminuted and mildly displaced intertrochanteric fracture of the right femur. Electronically Signed   By: Vanetta Chou M.D.   On: 10/05/2024 17:18   CT Head Wo Contrast Result Date: 10/05/2024 EXAM: CT HEAD WITHOUT CONTRAST 10/05/2024 03:49:14 PM TECHNIQUE: CT of the head was performed without the administration of intravenous contrast. Automated exposure control, iterative reconstruction, and/or weight based adjustment of the mA/kV was utilized to reduce the radiation dose to as low as reasonably achievable. Examination is somewhat limited due to motion artifact. COMPARISON: 2017. CLINICAL HISTORY: Head trauma, minor (Age >= 65y). Trauma pt, altered and not holding still. FINDINGS: BRAIN AND VENTRICLES: No acute hemorrhage. No evidence of acute infarct. Prominence of the lateral ventricles at a proportion to the degree of sulcal enlargement. There is a borderline callosal angle. Evans index measures approximately 0.42. No extra-axial collection. No mass effect or midline shift. Mild chronic microvascular ischemic changes. ORBITS: No acute abnormality.  SINUSES: No acute abnormality. SOFT TISSUES AND SKULL: No acute soft tissue abnormality. No skull fracture. IMPRESSION: 1. No acute intracranial abnormality. 2. Prominence of the lateral ventricles which could reflect normal pressure hydrocephalus in the appropriate clinical context. 3. Mild chronic microvascular ischemic changes. Electronically signed by: Donnice Mania MD 10/05/2024 04:01 PM EDT RP Workstation: [id number]   CT PELVIS WO CONTRAST Result Date: 10/05/2024 CLINICAL DATA:  Trauma. EXAM: CT PELVIS WITHOUT CONTRAST TECHNIQUE: Multidetector CT imaging of the pelvis was performed following the standard protocol without intravenous contrast. RADIATION DOSE REDUCTION: This exam was performed according to the departmental dose-optimization program which includes automated exposure control, adjustment of the mA and/or kV according to patient size and/or use of iterative reconstruction technique. COMPARISON:  CT abdomen pelvis dated 10/08/2009. FINDINGS: Urinary Tract:  The urinary bladder is unremarkable. Bowel:  No dilated bowel loops.  The appendix is normal. Vascular/Lymphatic: Moderate aortoiliac  atherosclerotic disease. No pelvic adenopathy. Reproductive: The uterus is grossly unremarkable. No suspicious adnexal masses. Other:  None Musculoskeletal: Comminuted and mildly displaced intertrochanteric fracture of the right femur with displaced fracture fragments of the greater and lesser trochanters. No dislocation. The bones are osteopenic. IMPRESSION: 1. Comminuted and mildly displaced intertrochanteric fracture of the right femur. 2.  Aortic Atherosclerosis (ICD10-I70.0). Electronically Signed   By: Vanetta Chou M.D.   On: 10/05/2024 15:59    Family Communication: Discussed with patient, husband at bedside. They understand and agree. All questions answered.  Disposition: Status is: Inpatient Remains inpatient appropriate because: s/p ORIF, PT/ OT  Planned Discharge Destination: Home with  Home Health     Time spent: 45 minutes  Author: Concepcion Riser, MD 10/07/2024 3:28 PM Secure chat 7am to 7pm For on call review [email or web address].

## 2024-10-07 NOTE — Evaluation (Signed)
 Physical Therapy Evaluation Patient Details Name: Evelyn [name] MRN: [id number] DOB: [birth year] Today's Date: 10/07/2024  History of Present Illness  78 yo admitted 10/23 with displaced intertrochanteric fracture of the R femur after a fall. Underwent surgical repair 10/06/24.  PMH includes a-fib, PAD, focal epilepsy, memory impairment , DVT, PE and disequalibrium syndrome  Clinical Impression  Patient is s/p above surgery presenting with functional limitations due to the deficits listed below (see PT Problem List). Reports previously being independent with a rollator. She required significant assistance with bed mobility and transfers today (Max A). Requires heavy encouragement to remain engaged with participation. Reduced insight/awareness. Once upright she was able to ambulate with min assist using RW, demonstrating good UE strength to unload RLE adequately to ambulate slowly in room. Patient will benefit from acute skilled PT to increase their independence and safety with mobility to facilitate discharge. Patient will benefit from continued inpatient follow up therapy, <3 hours/day. I think it would be very challenging for husband to adequately care for her until she regains some functional independence before returning home. Will work with her acutely as tolerated during admission.         If plan is discharge home, recommend the following: Two people to help with walking and/or transfers;A lot of help with bathing/dressing/bathroom;Assistance with cooking/housework;Direct supervision/assist for medications management;Direct supervision/assist for financial management;Assist for transportation;Help with stairs or ramp for entrance;Supervision due to cognitive status   Can travel by private vehicle   No (Likely soon)    Equipment Recommendations Rolling walker (2 wheels)  Recommendations for Other Services       Functional Status Assessment Patient has had a recent decline in their  functional status and demonstrates the ability to make significant improvements in function in a reasonable and predictable amount of time.     Precautions / Restrictions Precautions Precautions: Fall Recall of Precautions/Restrictions: Intact Precaution/Restrictions Comments: WBAT Restrictions Weight Bearing Restrictions Per Provider Order: No Other Position/Activity Restrictions: No restrictions      Mobility  Bed Mobility Overal bed mobility: Needs Assistance Bed Mobility: Rolling, Sidelying to Sit Rolling: Mod assist, Used rails Sidelying to sit: Max assist, HOB elevated       General bed mobility comments: Mod assist to roll, support RLE, and max assist for trunk support to rise to EOB. Cues for technique and rail use.    Transfers Overall transfer level: Needs assistance Equipment used: Rolling walker (2 wheels) Transfers: Sit to/from Stand Sit to Stand: Max assist, From elevated surface           General transfer comment: Required several attempts from EOB with max cues for technique. Eventually completed with max assist WB primarily with LLE. Gradually steadies with bil hands on RW for support. Poor control with descent despite cues, rushes to sit.    Ambulation/Gait Ambulation/Gait assistance: Min assist Gait Distance (Feet): 15 Feet Assistive device: Rolling walker (2 wheels) Gait Pattern/deviations: Step-to pattern, Decreased stance time - right, Decreased weight shift to right, Antalgic Gait velocity: dec Gait velocity interpretation: <1.31 ft/sec, indicative of household ambulator   General Gait Details: Educated on safe AD use with RW. Cues for walker placement, step length and BIL UE use for leverage and WB tolerance. Good UE strength to adequately unload RLE as needed. Min assist for RW control and initially for balance. Slow and guarded but without buckling.  Stairs            Wheelchair Mobility     Tilt Bed  Modified Rankin (Stroke  Patients Only)       Balance Overall balance assessment: Needs assistance Sitting-balance support: Feet supported Sitting balance-Leahy Scale: Fair     Standing balance support: Bilateral upper extremity supported, Reliant on assistive device for balance Standing balance-Leahy Scale: Poor                               Pertinent Vitals/Pain Pain Assessment Pain Location: Right hip Pain Descriptors / Indicators: Discomfort, Grimacing    Home Living Family/patient expects to be discharged to:: Private residence Living Arrangements: Spouse/significant other Available Help at Discharge: Family Type of Home: House Home Access: Stairs to enter Entrance Stairs-Rails: Right (garage has rail Rt (front porch has bil)) Entrance Stairs-Number of Steps: 5   Home Layout: One level Home Equipment: Rollator (4 wheels);BSC/3in1      Prior Function Prior Level of Function : Independent/Modified Independent;History of Falls (last six months)             Mobility Comments: Reports using rollator at baseline. Fell when mobilizing without rollator. ADLs Comments: reports ind     Extremity/Trunk Assessment   Upper Extremity Assessment Upper Extremity Assessment: Defer to OT evaluation    Lower Extremity Assessment Lower Extremity Assessment: RLE deficits/detail (Reoprts hx of BIL peripheral neuropathy.) RLE Deficits / Details: post op guarding and weakness as expected.    Cervical / Trunk Assessment Cervical / Trunk Assessment: Normal  Communication   Communication Communication: No apparent difficulties    Cognition Arousal: Alert Behavior During Therapy: Anxious   PT - Cognitive impairments: No family/caregiver present to determine baseline, Awareness, Memory, Attention, Initiation, Sequencing, Problem solving, Safety/Judgement                       PT - Cognition Comments: Fidgeting with multiple lines, needs cues to avoid. States she had been up  moving around with nursing this morning but later stated she hadn't. Following commands: Impaired Following commands impaired: Only follows one step commands consistently     Cueing Cueing Techniques: Verbal cues, Tactile cues, Gestural cues     General Comments General comments (skin integrity, edema, etc.): Husband present for assessment    Exercises General Exercises - Lower Extremity Ankle Circles/Pumps: AROM, Both, 10 reps, Seated Quad Sets: Strengthening, Both, 10 reps, Seated   Assessment/Plan    PT Assessment Patient needs continued PT services  PT Problem List Decreased strength;Decreased range of motion;Decreased activity tolerance;Decreased balance;Decreased mobility;Decreased knowledge of use of DME;Decreased safety awareness;Decreased knowledge of precautions;Impaired sensation;Pain;Decreased cognition;Decreased coordination       PT Treatment Interventions DME instruction;Gait training;Stair training;Functional mobility training;Therapeutic activities;Therapeutic exercise;Balance training;Neuromuscular re-education;Cognitive remediation;Patient/family education;Modalities;Wheelchair mobility training    PT Goals (Current goals can be found in the Care Plan section)  Acute Rehab PT Goals Patient Stated Goal: Go home PT Goal Formulation: With patient Time For Goal Achievement: 10/21/24 Potential to Achieve Goals: Good    Frequency Min 2X/week     Co-evaluation               AM-PAC PT 6 Clicks Mobility  Outcome Measure Help needed turning from your back to your side while in a flat bed without using bedrails?: A Lot Help needed moving from lying on your back to sitting on the side of a flat bed without using bedrails?: A Lot Help needed moving to and from a bed to a chair (including a wheelchair)?: A Lot  Help needed standing up from a chair using your arms (e.g., wheelchair or bedside chair)?: A Lot Help needed to walk in hospital room?: A Little Help  needed climbing 3-5 steps with a railing? : Total 6 Click Score: 12    End of Session Equipment Utilized During Treatment: Gait belt Activity Tolerance: Patient limited by pain (anxious) Patient left: in chair;with call bell/phone within reach;with chair alarm set;with nursing/sitter in room Nurse Communication: Mobility status (+2 to stand, min assist for gait) PT Visit Diagnosis: Unsteadiness on feet (R26.81);Other abnormalities of gait and mobility (R26.89);Muscle weakness (generalized) (M62.81);History of falling (Z91.81);Difficulty in walking, not elsewhere classified (R26.2);Other symptoms and signs involving the nervous system (R29.898);Pain Pain - Right/Left: Right Pain - part of body: Hip    Time: 9045-8969 PT Time Calculation (min) (ACUTE ONLY): 36 min   Charges:   PT Evaluation $PT Eval Low Complexity: 1 Low PT Treatments $Therapeutic Activity: 8-22 mins PT General Charges $$ ACUTE PT VISIT: 1 Visit         Leontine Roads, PT, DPT [facility]  Rehabilitation Services Physical Therapist Office: [phone] Website: [email or web address]   Leontine GORMAN Roads 10/07/2024, 2:03 PM

## 2024-10-07 NOTE — Progress Notes (Signed)
 PHARMACY - ANTICOAGULATION CONSULT NOTE  Pharmacy Consult for warfarin + enoxaparin  Indication: atrial fibrillation/Hx VTE  Allergies  Allergen Reactions   Tegretol [Carbamazepine] Rash   Patient Measurements: Height: 5' 5 (165.1 cm) Weight: 78.2 kg (172 lb 6.4 oz) IBW/kg (Calculated) : 57 HEPARIN DW (KG): 73.3  Vital Signs: Temp: 97.1 F (36.2 C) (10/25 1547) Temp Source: Oral (10/25 1141) BP: 130/57 (10/25 1547) Pulse Rate: 95 (10/25 1547)  Labs: Recent Labs    10/05/24 1555 10/05/24 1622 10/05/24 1622 10/05/24 1645 10/06/24 0046 10/07/24 0156  HGB  --  11.3*   < > 11.9* 10.5* 9.8*  HCT  --  36.0   < > 35.0* 33.1* 30.6*  PLT  --  297  --   --  283 216  APTT 28  --   --   --   --   --   LABPROT 19.3*  --   --   --  16.8* 14.8  INR 1.5*  --   --   --  1.3* 1.1  CREATININE  --  0.78  --  0.70 0.71  --    < > = values in this interval not displayed.   Estimated Creatinine Clearance: 60.9 mL/min (by C-G formula based on SCr of 0.71 mg/dL).  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   Elevated troponin 08/22/2024   Headache(784.0)    History of echocardiogram 03/11/2009   nl EF 60-65%   History of recurrent UTIs    feels like may have one now   PE (pulmonary embolism) 12/15/2003   Peripheral vascular disease    dvt leg rt   Seizures (HCC)     Medications:  Scheduled:   docusate sodium  100 mg Oral BID   lamoTRIgine   200 mg Oral BID   lamoTRIgine   50 mg Oral BID    mirtazapine   3.75-7.5 mg Oral QHS   Warfarin - Pharmacist Dosing Inpatient   Does not apply q1600   Infusions:    Assessment: Patient is a 27 YOF admitted for right femur fracture s/p OR intervention by orthopedics 10/24. Patient on warfarin PTA for Hx VTE/afib. Last dose warfarin 10/23. Initial INR on presentation 10/23 was 1.5. Patient's INR was previously therapeutic at INR 2.0 x2 visits on regimen of warfarin 7.5mg  tab, 1 tablet daily except for 1/2 tablet on Saturdays and Sundays (total weekly dose 45 mg). Pharmacy consulted to dose warfarin; ok to restart per orthopedics.   Pharmacy consulted to add VTE ppx dose enoxaparin  as well.  Goal of Therapy:  INR 2-3 Monitor platelets by anticoagulation protocol: Yes   Plan:  Enoxaparin  40mg  SQ daily  Ozell Jamaica, PharmD, BCPS, [facility] Clinical Pharmacist [phone] Please check AMION for all [facility] Pharmacy numbers 10/07/2024

## 2024-10-08 DIAGNOSIS — Z86711 Personal history of pulmonary embolism: Secondary | ICD-10-CM | POA: Diagnosis not present

## 2024-10-08 DIAGNOSIS — R413 Other amnesia: Secondary | ICD-10-CM | POA: Diagnosis not present

## 2024-10-08 DIAGNOSIS — Z86718 Personal history of other venous thrombosis and embolism: Secondary | ICD-10-CM | POA: Diagnosis not present

## 2024-10-08 DIAGNOSIS — S72141A Displaced intertrochanteric fracture of right femur, initial encounter for closed fracture: Secondary | ICD-10-CM | POA: Diagnosis not present

## 2024-10-08 LAB — CBC
HCT: 26.2 % — ABNORMAL LOW (ref 36.0–46.0)
Hemoglobin: 8.4 g/dL — ABNORMAL LOW (ref 12.0–15.0)
MCH: 27.6 pg (ref 26.0–34.0)
MCHC: 32.1 g/dL (ref 30.0–36.0)
MCV: 86.2 fL (ref 80.0–100.0)
Platelets: 214 K/uL (ref 150–400)
RBC: 3.04 MIL/uL — ABNORMAL LOW (ref 3.87–5.11)
RDW: 14.8 % (ref 11.5–15.5)
WBC: 13.2 K/uL — ABNORMAL HIGH (ref 4.0–10.5)
nRBC: 0 % (ref 0.0–0.2)

## 2024-10-08 LAB — PROTIME-INR
INR: 1.1 (ref 0.8–1.2)
Prothrombin Time: 14.4 s (ref 11.4–15.2)

## 2024-10-08 MED ORDER — WARFARIN SODIUM 7.5 MG PO TABS
7.5000 mg | ORAL_TABLET | Freq: Once | ORAL | Status: AC
Start: 2024-10-08 — End: 2024-10-08
  Administered 2024-10-08: 7.5 mg via ORAL
  Filled 2024-10-08: qty 1

## 2024-10-08 NOTE — Plan of Care (Signed)

## 2024-10-08 NOTE — Progress Notes (Signed)
 Patient ID: Evelyn [name], female   DOB: 04/26/1946, 78 y.o.   MRN: [id number] Patient is status post internal fixation for intertrochanteric right hip fracture.  Patient states she has trouble getting out of bed but when she is walking she is doing better.  Recommend continuing working with exercises for the right lower extremity.

## 2024-10-08 NOTE — Progress Notes (Signed)
 PHARMACY - ANTICOAGULATION CONSULT NOTE  Pharmacy Consult for warfarin + enoxaparin  Indication: atrial fibrillation/Hx VTE  Allergies  Allergen Reactions   Tegretol [Carbamazepine] Rash   Patient Measurements: Height: 5' 5 (165.1 cm) Weight: 78.2 kg (172 lb 6.4 oz) IBW/kg (Calculated) : 57 HEPARIN DW (KG): 73.3  Vital Signs: Temp: 97.9 F (36.6 C) (10/26 0509) Temp Source: Oral (10/26 0052) BP: 146/65 (10/26 0509) Pulse Rate: 91 (10/26 0509)  Labs: Recent Labs    10/05/24 1555 10/05/24 1622 10/05/24 1622 10/05/24 1645 10/06/24 0046 10/07/24 0156 10/08/24 0528  HGB  --  11.3*   < > 11.9* 10.5* 9.8* 8.4*  HCT  --  36.0   < > 35.0* 33.1* 30.6* 26.2*  PLT  --  297   < >  --  283 216 214  APTT 28  --   --   --   --   --   --   LABPROT 19.3*  --   --   --  16.8* 14.8 14.4  INR 1.5*  --   --   --  1.3* 1.1 1.1  CREATININE  --  0.78  --  0.70 0.71  --   --    < > = values in this interval not displayed.   Estimated Creatinine Clearance: 60.9 mL/min (by C-G formula based on SCr of 0.71 mg/dL).  Medications:  Scheduled:   docusate sodium  100 mg Oral BID   enoxaparin  (LOVENOX ) injection  40 mg Subcutaneous Q24H   lamoTRIgine   200 mg Oral BID   lamoTRIgine   50 mg Oral BID   mirtazapine   3.75-7.5 mg Oral QHS   Warfarin - Pharmacist Dosing Inpatient   Does not apply q1600   Assessment: Patient is a 14 YOF admitted for right femur fracture s/p OR intervention by orthopedics 10/24. Patient on warfarin PTA for Hx VTE/afib. Last dose warfarin 10/23. Initial INR on presentation 10/23 was 1.5. Patient's INR was previously therapeutic at INR 2.0 x2 visits on regimen of warfarin 7.5mg  tab, 1 tablet daily except for 1/2 tablet on Saturdays and Sundays (total weekly dose 45 mg). Pharmacy consulted to dose warfarin; ok to restart per orthopedics.   Pharmacy consulted to add VTE ppx dose enoxaparin  as well.  INR this AM 1.1. Received 5mg  yesterday. Only missed dose 10/24 for surgery  since last dose PTA was 10/23.   Goal of Therapy:  INR 2-3 Monitor platelets by anticoagulation protocol: Yes   Plan:  Warfarin 7.5 mg x1 today Continue enoxaparin  40mg  SQ daily Monitor INR, CBC daily while inpatient  Maurilio Patten, PharmD PGY1 Pharmacy Resident [facility] 10/08/2024 8:51 AM

## 2024-10-08 NOTE — Progress Notes (Signed)
 Progress Note   Patient: Evelyn [name] FMW:[id number] DOB: 1946-09-02 DOA: 10/05/2024     3 DOS: the patient was seen and examined on 10/08/2024   Brief hospital course: Evelyn [name] is a 78 y.o. female with medical history significant of A-fib, PAD, focal epilepsy, memory impairment, DVT, PE, migraines presenting after fall at home. Found to have comminuted, mildly displaced intertrochanteric right femur fracture. CT pelvis showed the same comminuted, mildly displaced intertrochanteric right femur fracture. Admitted to [facility] service with orthopedic surgery evaluation.  Assessment and Plan: Fall S/p Femur fracture S/p ORIF 10/06/24. Continue pain control. Weight bearing as tolerated and will follow with ortho in 2 weeks for staple removal. PT/ OT working with her, patient and husband wish to go home with [facility]. Coumadin  restarted yesterday evening.  Acute post op blood loss anemia Hb stable at 8.4.  Continue to monitor hemoglobin daily she is on Coumadin , transfuse for hemoglobin less than 7.   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  Priorly diagnosed with disequilibrium syndrome.  Previously followed with [facility], now follows with [facility]. Had workup in 2020 12/2020 with normal VNG and MRI brain with no ancillary findings suggesting NPH.  Follow-up CTs have shown stable ventriculomegaly. Continue to follow-up with neurology outpatient   A-fib Restarted Coumadin .   PAD Coumadin    Focal epilepsy Continue home Lamictal    History of DVT and PE Coumadin  therapy.   Out of bed to chair. Incentive spirometry. Nursing supportive care. Fall, aspiration precautions. Diet:  Diet Orders (From admission, onward)     Start     Ordered   10/06/24 1536  Diet Heart Room service appropriate? Yes; Fluid consistency: Thin  Diet effective now        Question Answer Comment  Room service appropriate? Yes   Fluid consistency: Thin      10/06/24 1535           DVT prophylaxis: enoxaparin  (LOVENOX ) injection 40 mg Start: 10/07/24 1800 SCDs Start: 10/06/24 1536 Place TED hose Start: 10/06/24 1536 SCDs Start: 10/05/24 1734 warfarin (COUMADIN ) tablet 7.5 mg  Level of care: Med-Surg   Code Status: Full Code  Subjective: Patient is seen and examined today morning. She is sitting in chair, walked with PT. Denies pain. Husband at bedside. Eating fair.  Physical Exam: Vitals:   10/08/24 0052 10/08/24 0509 10/08/24 0858 10/08/24 1100  BP: (!) 159/67 (!) 146/65 (!) 131/54 132/61  Pulse: 94 91 100 86  Resp: 18 18 20 20   Temp: 98 F (36.7 C) 97.9 F (36.6 C) 98.5 F (36.9 C) 98.4 F (36.9 C)  TempSrc: Oral     SpO2: 94% 94% 97% 98%  Weight:      Height:        General - Elderly Caucasian female, no acute distress. HEENT - PERRLA, EOMI, atraumatic head, non tender sinuses. Lung - Clear, no rales, rhonchi, wheezes. Heart - S1, S2 heard, no murmurs, rubs, trace pedal edema. Abdomen - Soft, non tender, bowel sounds good Neuro - Alert, awake and oriented x 3, non focal exam. Skin - Warm and dry. Right hip dressing.  Data Reviewed:      Latest Ref Rng & Units 10/08/2024    5:28 AM 10/07/2024    1:56 AM 10/06/2024   12:46 AM  CBC  WBC 4.0 - 10.5 K/uL 13.2  10.7  12.7   Hemoglobin 12.0 - 15.0 g/dL 8.4  9.8  10.5   Hematocrit 36.0 - 46.0 % 26.2  30.6  33.1   Platelets 150 - 400 K/uL 214  216  283       Latest Ref Rng & Units 10/06/2024   12:46 AM 10/05/2024    4:45 PM 10/05/2024    4:22 PM  BMP  Glucose 70 - 99 mg/dL 876  884  884   BUN 8 - 23 mg/dL 21  24  20    Creatinine 0.44 - 1.00 mg/dL 9.28  9.29  9.21   Sodium 135 - 145 mmol/L 135  138  137   Potassium 3.5 - 5.1 mmol/L 4.0  4.2  4.1   Chloride 98 - 111 mmol/L 101  106  100   CO2 22 - 32 mmol/L 23   24   Calcium 8.9 - 10.3 mg/dL 8.7   9.2    No results  found.   Family Communication: Discussed with patient, husband at bedside. They understand and agree. All questions answered.  Disposition: Status is: Inpatient Remains inpatient appropriate because: s/p ORIF, PT/ OT  Planned Discharge Destination: Home with Home Health     Time spent: 43 minutes  Author: Concepcion Riser, MD 10/08/2024 3:15 PM Secure chat 7am to 7pm For on call review [email or web address].

## 2024-10-08 NOTE — Progress Notes (Signed)
 Mobility Specialist: Progress Note   10/08/24 1500  Mobility  Activity Ambulated with assistance  Level of Assistance Minimal assist, patient does 75% or more  Assistive Device Front wheel walker  Distance Ambulated (ft) 30 ft  Activity Response Tolerated well  Mobility Referral Yes  Mobility visit 1 Mobility  Mobility Specialist Start Time (ACUTE ONLY) [id number]  Mobility Specialist Stop Time (ACUTE ONLY) 0955  Mobility Specialist Time Calculation (min) (ACUTE ONLY) 29 min    Pt received in bed, agreeable to mobility session. Husband present and helpful. ModA for bed mobility to assist RLE off the bed and scoot EOB. Heavy modA for successful STS. Took two attempts to stand - pt very anxious about falling. CGA for ambulation. Ambulated to the door, then around the bed to the recliner chair. Pt and husband very pleased with progress. Agreed to sit up in chair for awhile. Left in chair with all needs met, call bell in reach.   Ileana Lute Mobility Specialist Please contact via SecureChat or Rehab office at [phone]

## 2024-10-08 NOTE — TOC Initial Note (Signed)
 Transition of Care [facility]) - Initial/Assessment Note    Patient Details  Name: Evelyn [name] MRN: [id number] Date of Birth: 1946-04-07  Transition of Care [facility]) CM/SW Contact:    Lauraine FORBES Saa, LCSWA Phone Number: 10/08/2024, 12:49 PM  Clinical Narrative:                  12:50 PM CSW introduced self and role to patient and patient's spouse, Evelyn [name]. CSW informed patient and patient's spouse of therapy's recommendation of patient discharging to SNF. Patient's spouse expressed preference in patient discharging home with [facility] vs SNF as he works from home and resides with patient (per chart review, patient has a memory impairment). CSW made medical team aware. Per chart review, patient does not have SNF/HH/DME history. Patient has a PCP and insurance. Patient's preferred pharmacy's are [facility] and [facility] [phone] [place]. TOC will continue to follow.  Expected Discharge Plan: Home w Home Health Services Barriers to Discharge: Continued Medical Work up   Patient Goals and CMS Choice            Expected Discharge Plan and Services In-house Referral: Clinical Social Work Discharge Planning Services: CM Consult Post Acute Care Choice: Home Health Living arrangements for the past 2 months: Single Family Home                                      Prior Living Arrangements/Services Living arrangements for the past 2 months: Single Family Home Lives with:: Spouse Patient language and need for interpreter reviewed:: Yes        Need for Family Participation in Patient Care: Yes (Comment) Care giver support system in place?: Yes (comment)   Criminal Activity/Legal Involvement Pertinent to Current Situation/Hospitalization: No - Comment as needed  Activities of Daily Living   ADL Screening (condition at time of admission) Independently performs ADLs?: Yes (appropriate for developmental age) Is the patient deaf or have difficulty hearing?: No Does the  patient have difficulty seeing, even when wearing glasses/contacts?: No Does the patient have difficulty concentrating, remembering, or making decisions?: No  Permission Sought/Granted Permission sought to share information with : Family Supports, Oceanographer granted to share information with : Yes, Verbal Permission Granted  Share Information with NAME: Evelyn [name]  Permission granted to share info w AGENCY: HH  Permission granted to share info w Relationship: Spouse  Permission granted to share info w Contact Information: [phone]  Emotional Assessment       Orientation: : Oriented to Self, Oriented to Place, Oriented to  Time, Oriented to Situation Alcohol / Substance Use: Not Applicable Psych Involvement: No (comment)  Admission diagnosis:  Closed fracture of right hip, initial encounter (HCC) [S72.001A] Closed comminuted intertrochanteric fracture of proximal end of right femur (HCC) [S72.141A] Patient Active Problem List   Diagnosis Date Noted   Displaced intertrochanteric fracture of right femur, initial encounter for closed fracture (HCC) 10/05/2024   History of pulmonary embolus (PE) 10/05/2024   Migraine 10/05/2024   Closed comminuted intertrochanteric fracture of proximal end of right femur (HCC) 10/05/2024   Headache 01/26/2024   Peripheral vascular disease 01/26/2024   Memory impairment 09/17/2021   Cerebral ventriculomegaly 11/11/2020   Atrial fibrillation (HCC) 09/27/2020   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 [place]  [address] Phone: [phone] Fax: [phone]     Social Drivers of Health (SDOH) Social History: SDOH Screenings   Food Insecurity: No Food Insecurity (10/05/2024)  Housing: Low Risk  (10/05/2024)  Transportation Needs: No Transportation Needs (10/05/2024)  Utilities: Not At Risk (10/05/2024)  Social Connections: Moderately Integrated (10/05/2024)  Tobacco Use: Low Risk  (10/06/2024)   SDOH Interventions:     Readmission Risk Interventions     No data to display

## 2024-10-09 ENCOUNTER — Other Ambulatory Visit (HOSPITAL_COMMUNITY): Payer: Self-pay

## 2024-10-09 ENCOUNTER — Telehealth (HOSPITAL_COMMUNITY): Payer: Self-pay

## 2024-10-09 ENCOUNTER — Encounter (HOSPITAL_COMMUNITY): Payer: Self-pay | Admitting: Orthopaedic Surgery

## 2024-10-09 DIAGNOSIS — Z86711 Personal history of pulmonary embolism: Secondary | ICD-10-CM | POA: Diagnosis not present

## 2024-10-09 DIAGNOSIS — R791 Abnormal coagulation profile: Secondary | ICD-10-CM

## 2024-10-09 DIAGNOSIS — R413 Other amnesia: Secondary | ICD-10-CM | POA: Diagnosis not present

## 2024-10-09 DIAGNOSIS — Z86718 Personal history of other venous thrombosis and embolism: Secondary | ICD-10-CM | POA: Diagnosis not present

## 2024-10-09 DIAGNOSIS — S72141A Displaced intertrochanteric fracture of right femur, initial encounter for closed fracture: Secondary | ICD-10-CM | POA: Diagnosis not present

## 2024-10-09 LAB — CBC
HCT: 26.6 % — ABNORMAL LOW (ref 36.0–46.0)
Hemoglobin: 8.5 g/dL — ABNORMAL LOW (ref 12.0–15.0)
MCH: 27.8 pg (ref 26.0–34.0)
MCHC: 32 g/dL (ref 30.0–36.0)
MCV: 86.9 fL (ref 80.0–100.0)
Platelets: 238 K/uL (ref 150–400)
RBC: 3.06 MIL/uL — ABNORMAL LOW (ref 3.87–5.11)
RDW: 14.9 % (ref 11.5–15.5)
WBC: 9.8 K/uL (ref 4.0–10.5)
nRBC: 0 % (ref 0.0–0.2)

## 2024-10-09 LAB — PROTIME-INR
INR: 1.1 (ref 0.8–1.2)
Prothrombin Time: 15 s (ref 11.4–15.2)

## 2024-10-09 MED ORDER — WARFARIN SODIUM 10 MG PO TABS
10.0000 mg | ORAL_TABLET | Freq: Once | ORAL | Status: AC
  Administered 2024-10-09: 10 mg via ORAL
  Filled 2024-10-09: qty 1

## 2024-10-09 MED ORDER — WARFARIN SODIUM 7.5 MG PO TABS
7.5000 mg | ORAL_TABLET | Freq: Once | ORAL | Status: DC
  Filled 2024-10-09: qty 1

## 2024-10-09 NOTE — Progress Notes (Signed)
 Mobility Specialist: Progress Note   10/09/24 1600  Mobility  Activity Pivoted/transferred to/from [facility]  Level of Assistance Minimal assist, patient does 75% or more  Assistive Device Front wheel walker  Activity Response Tolerated well  Mobility Referral Yes  Mobility visit 1 Mobility  Mobility Specialist Start Time (ACUTE ONLY) 1555  Mobility Specialist Stop Time (ACUTE ONLY) 1608  Mobility Specialist Time Calculation (min) (ACUTE ONLY) 13 min    Pt received in bed, requesting assistance to [facility]. ModA for bed mobility to assist RLE off bed and scooting. ModA for STS with good hand placement. CGA for stand pivot to [facility]. Void successful. MS assisted with pericare while pt stood with CGA. Returned pt back to bed. ModA for sit>supine. Left in bed with all needs met, call bell in reach.   Ileana Lute Mobility Specialist Please contact via SecureChat or Rehab office at [phone]

## 2024-10-09 NOTE — Care Management Important Message (Signed)
 Important Message  Patient Details  Name: Evelyn [name] MRN: [id number] Date of Birth: 1946/08/30   Important Message Given:  Yes - Medicare IM     Claretta Deed 10/09/2024, 3:27 PM

## 2024-10-09 NOTE — Plan of Care (Signed)

## 2024-10-09 NOTE — Progress Notes (Signed)
 PHARMACY - ANTICOAGULATION CONSULT NOTE  Pharmacy Consult for warfarin + enoxaparin  Indication: atrial fibrillation/Hx VTE  Allergies  Allergen Reactions   Tegretol [Carbamazepine] Rash   Patient Measurements: Height: 5' 5 (165.1 cm) Weight: 78.2 kg (172 lb 6.4 oz) IBW/kg (Calculated) : 57 HEPARIN DW (KG): 73.3  Vital Signs: Temp: 98.4 F (36.9 C) (10/27 0810) Temp Source: Oral (10/27 0810) BP: 149/62 (10/27 0615) Pulse Rate: 91 (10/27 0615)  Labs: Recent Labs    10/07/24 0156 10/08/24 0528 10/09/24 0438  HGB 9.8* 8.4* 8.5*  HCT 30.6* 26.2* 26.6*  PLT 216 214 238  LABPROT 14.8 14.4 15.0  INR 1.1 1.1 1.1   Estimated Creatinine Clearance: 60.9 mL/min (by C-G formula based on SCr of 0.71 mg/dL).  Medications:  Scheduled:   docusate sodium  100 mg Oral BID   enoxaparin  (LOVENOX ) injection  40 mg Subcutaneous Q24H   lamoTRIgine   200 mg Oral BID   lamoTRIgine   50 mg Oral BID   mirtazapine   3.75-7.5 mg Oral QHS   warfarin  7.5 mg Oral ONCE-1600   Warfarin - Pharmacist Dosing Inpatient   Does not apply q1600   Assessment: Patient is a 81 YOF admitted for right femur fracture s/p OR intervention by orthopedics 10/24. Patient on warfarin PTA for Hx VTE/afib. Last dose warfarin 10/23. Initial INR on presentation 10/23 was 1.5. Patient's INR was previously therapeutic at INR 2.0 x2 visits on regimen of warfarin 7.5mg  tab, 1 tablet daily except for 1/2 tablet on Saturdays and Sundays (total weekly dose 45 mg). Pharmacy consulted to dose warfarin; ok to restart per orthopedics.   Pharmacy consulted to add VTE ppx dose enoxaparin  as well. Only missed dose 10/24 for surgery since last dose PTA was 10/23.  Patient received phytonadione 10 mg  IV x 1 on 10/23.  INR this AM is 1.1 again.   Goal of Therapy:  INR 2-3 Monitor platelets by anticoagulation protocol: Yes   Plan:  Warfarin 7.5 mg x1 today Continue enoxaparin  40mg  SQ daily Monitor daily INR, CBC, clinical course,  s/sx of bleed, PO intake/diet, Drug-Drug Interactions    Thank you for allowing pharmacy to be a part of this patient's care.   Bascom JAYSON Louder, PharmD 10/09/2024 8:15 AM  **Pharmacist phone directory can be found on [email or web address] listed under [facility]**

## 2024-10-09 NOTE — Telephone Encounter (Signed)
 Pharmacy Patient Advocate Encounter  Insurance verification completed.    The patient is insured through [employer]. Patient has Medicare and is not eligible for a copay card, but may be able to apply for patient assistance or Medicare RX Payment Plan (Patient Must reach out to their plan, if eligible for payment plan), if available.    Ran test claim for Eliquis 5mg  and the current 30 day co-pay is $0.   This test claim was processed through [facility]- copay amounts may vary at other pharmacies due to [employer], or as the patient moves through the different stages of their insurance plan.

## 2024-10-09 NOTE — Progress Notes (Signed)
 Progress Note   Evelyn [name]: Evelyn [name] FMW:[id number] DOB: Sep 07, 1946 DOA: 10/05/2024     4 DOS: the Evelyn [name] was seen and examined on 10/09/2024   Brief hospital course: Evelyn [name] is a 78 y.o. female with medical history significant of A-fib, PAD, focal epilepsy, memory impairment, DVT, PE, migraines presenting after fall at home. Found to have comminuted, mildly displaced intertrochanteric right femur fracture. CT pelvis showed the same comminuted, mildly displaced intertrochanteric right femur fracture. Admitted to [facility] service with orthopedic surgery evaluation.  Assessment and Plan: Fall S/p Femur fracture S/p ORIF 10/06/24. Continue pain control. Weight bearing as tolerated and will follow with ortho in 2 weeks for staple removal. PT/ OT working with her, Evelyn [name] and husband wish to go home with [facility]. Evelyn [name] is on Lovenox , Coumadin .  Acute post op blood loss anemia Hb stable at 8.4. Continue to monitor hemoglobin daily Evelyn [name] is on Coumadin , transfuse for hemoglobin less than 7.   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  Priorly diagnosed with disequilibrium syndrome.  Previously followed with [facility], now follows with [facility]. Had workup in 2020 12/2020 with normal VNG and MRI brain with no ancillary findings suggesting NPH.  Follow-up CTs have shown stable ventriculomegaly. Continue to follow-up with neurology outpatient   Subtherapeutic INR A-fib Continue Coumadin . INR 1.1 today.  Follow INR daily and adjust per pharmacy protocol.   PAD Coumadin    Focal epilepsy Continue home Lamictal    History of DVT and PE Last blood clot 2010 per husband. Advised to discuss about new DOAC with PCP. Continue Coumadin  therapy.   Out of bed to chair. Incentive spirometry. Nursing supportive care. Fall, aspiration precautions. Diet:  Diet Orders  (From admission, onward)     Start     Ordered   10/06/24 1536  Diet Heart Room service appropriate? Yes; Fluid consistency: Thin  Diet effective now       Question Answer Comment  Room service appropriate? Yes   Fluid consistency: Thin      10/06/24 1535           DVT prophylaxis: enoxaparin  (LOVENOX ) injection 40 mg Start: 10/07/24 1800 SCDs Start: 10/06/24 1536 Place TED hose Start: 10/06/24 1536 SCDs Start: 10/05/24 1734 warfarin (COUMADIN ) tablet 7.5 mg  Level of care: Med-Surg   Code Status: Full Code  Subjective: Evelyn [name] is seen and examined today morning. Evelyn [name] is lying in bed, feels weak. Husband at bedside. Eating fair.  Physical Exam: Vitals:   10/09/24 0406 10/09/24 0615 10/09/24 0810 10/09/24 1100  BP: (!) 145/73 (!) 149/62 (!) 138/52 132/61  Pulse: 100 91 81 88  Resp: 18     Temp: 98.4 F (36.9 C) 98.6 F (37 C) 98.4 F (36.9 C) 98.1 F (36.7 C)  TempSrc: Oral  Oral Oral  SpO2: 95% 94% 93% 96%  Weight:      Height:        General - Elderly Caucasian female, no acute distress. HEENT - PERRLA, EOMI, atraumatic head, non tender sinuses. Lung - Clear, no rales, rhonchi, wheezes. Heart - S1, S2 heard, no murmurs, rubs, trace pedal edema. Abdomen - Soft, non tender, bowel sounds good Neuro - Alert, awake and oriented x 3, non focal exam. Skin - Warm and dry. Right hip dressing.  Data Reviewed:      Latest Ref Rng & Units 10/09/2024    4:38 AM 10/08/2024    5:28 AM 10/07/2024  1:56 AM  CBC  WBC 4.0 - 10.5 K/uL 9.8  13.2  10.7   Hemoglobin 12.0 - 15.0 g/dL 8.5  8.4  9.8   Hematocrit 36.0 - 46.0 % 26.6  26.2  30.6   Platelets 150 - 400 K/uL 238  214  216       Latest Ref Rng & Units 10/06/2024   12:46 AM 10/05/2024    4:45 PM 10/05/2024    4:22 PM  BMP  Glucose 70 - 99 mg/dL 876  884  884   BUN 8 - 23 mg/dL 21  24  20    Creatinine 0.44 - 1.00 mg/dL 9.28  9.29  9.21   Sodium 135 - 145 mmol/L 135  138  137   Potassium 3.5 - 5.1 mmol/L 4.0   4.2  4.1   Chloride 98 - 111 mmol/L 101  106  100   CO2 22 - 32 mmol/L 23   24   Calcium 8.9 - 10.3 mg/dL 8.7   9.2    No results found.   Family Communication: Discussed with Evelyn [name], husband at bedside. They understand and agree. All questions answered.  Disposition: Status is: Inpatient Remains inpatient appropriate because: s/p ORIF, PT/ OT  Planned Discharge Destination: Home with Home Health     Time spent: 44 minutes  Author: Concepcion Riser, MD 10/09/2024 2:47 PM Secure chat 7am to 7pm For on call review [email or web address].

## 2024-10-09 NOTE — Progress Notes (Signed)
 Mobility Specialist: Progress Note   10/09/24 1500  Mobility  Activity Ambulated with assistance  Level of Assistance Minimal assist, patient does 75% or more  Assistive Device Front wheel walker  Distance Ambulated (ft) 70 ft  Activity Response Tolerated well  Mobility Referral Yes  Mobility visit 1 Mobility  Mobility Specialist Start Time (ACUTE ONLY) 1111  Mobility Specialist Stop Time (ACUTE ONLY) 1136  Mobility Specialist Time Calculation (min) (ACUTE ONLY) 25 min    Pt received in bed, pleasant and agreeable to mobility session. Husband present and helpful. ModA for bed mobility to assist with trunk elevation. Heavy modA for STS with mod verbal cues for hand placement. Pt still very anxious about falling or RW rolling from under her. Educated pt on proper hand placement and planting her feet on the ground to prepare for standing. Took three tries for successful stand attempt. CGA for ambulation, c/o LE muscle fatigue towards end of session. Returned to room and agreed to sit up in the chair for awhile. Left in chair with all needs met, call bell in reach.   Ileana Lute Mobility Specialist Please contact via SecureChat or Rehab office at [phone]

## 2024-10-10 DIAGNOSIS — Z86718 Personal history of other venous thrombosis and embolism: Secondary | ICD-10-CM | POA: Diagnosis not present

## 2024-10-10 DIAGNOSIS — G40109 Localization-related (focal) (partial) symptomatic epilepsy and epileptic syndromes with simple partial seizures, not intractable, without status epilepticus: Secondary | ICD-10-CM | POA: Diagnosis not present

## 2024-10-10 DIAGNOSIS — I4891 Unspecified atrial fibrillation: Secondary | ICD-10-CM | POA: Diagnosis not present

## 2024-10-10 DIAGNOSIS — S72141A Displaced intertrochanteric fracture of right femur, initial encounter for closed fracture: Secondary | ICD-10-CM | POA: Diagnosis not present

## 2024-10-10 LAB — CBC
HCT: 25.8 % — ABNORMAL LOW (ref 36.0–46.0)
Hemoglobin: 8.2 g/dL — ABNORMAL LOW (ref 12.0–15.0)
MCH: 27.6 pg (ref 26.0–34.0)
MCHC: 31.8 g/dL (ref 30.0–36.0)
MCV: 86.9 fL (ref 80.0–100.0)
Platelets: 248 K/uL (ref 150–400)
RBC: 2.97 MIL/uL — ABNORMAL LOW (ref 3.87–5.11)
RDW: 14.8 % (ref 11.5–15.5)
WBC: 9.6 K/uL (ref 4.0–10.5)
nRBC: 0 % (ref 0.0–0.2)

## 2024-10-10 LAB — PROTIME-INR
INR: 1.3 — ABNORMAL HIGH (ref 0.8–1.2)
Prothrombin Time: 16.9 s — ABNORMAL HIGH (ref 11.4–15.2)

## 2024-10-10 MED ORDER — DOCUSATE SODIUM 100 MG PO CAPS: 100.0000 mg | ORAL_CAPSULE | Freq: Two times a day (BID) | ORAL | 0 refills | Status: AC

## 2024-10-10 MED ORDER — POLYETHYLENE GLYCOL 3350 17 G PO PACK: 17.0000 g | PACK | Freq: Every day | ORAL | 0 refills | Status: AC | PRN

## 2024-10-10 MED ORDER — WARFARIN SODIUM 10 MG PO TABS
10.0000 mg | ORAL_TABLET | Freq: Once | ORAL | Status: DC
Start: 2024-10-10 — End: 2024-10-10
  Filled 2024-10-10: qty 1

## 2024-10-10 MED ORDER — APIXABAN 5 MG PO TABS: 5.0000 mg | ORAL_TABLET | Freq: Two times a day (BID) | ORAL | 3 refills | Status: AC

## 2024-10-10 NOTE — Plan of Care (Signed)
  Problem: Education: Goal: Knowledge of General Education information will improve Description: Including pain rating scale, medication(s)/side effects and non-pharmacologic comfort measures Outcome: Completed/Met   Problem: Health Behavior/Discharge Planning: Goal: Ability to manage health-related needs will improve Outcome: Completed/Met   Problem: Clinical Measurements: Goal: Ability to maintain clinical measurements within normal limits will improve Outcome: Completed/Met Goal: Will remain free from infection Outcome: Completed/Met Goal: Diagnostic test results will improve Outcome: Completed/Met Goal: Respiratory complications will improve Outcome: Completed/Met Goal: Cardiovascular complication will be avoided Outcome: Completed/Met   Problem: Activity: Goal: Risk for activity intolerance will decrease Outcome: Completed/Met   Problem: Nutrition: Goal: Adequate nutrition will be maintained Outcome: Completed/Met   Problem: Coping: Goal: Level of anxiety will decrease Outcome: Completed/Met   Problem: Elimination: Goal: Will not experience complications related to bowel motility Outcome: Completed/Met Goal: Will not experience complications related to urinary retention Outcome: Completed/Met   Problem: Pain Managment: Goal: General experience of comfort will improve and/or be controlled Outcome: Completed/Met   Problem: Safety: Goal: Ability to remain free from injury will improve Outcome: Completed/Met   Problem: Skin Integrity: Goal: Risk for impaired skin integrity will decrease Outcome: Completed/Met   Problem: Education: Goal: Verbalization of understanding the information provided (i.e., activity precautions, restrictions, etc) will improve Outcome: Completed/Met Goal: Individualized Educational Video(s) Outcome: Completed/Met   Problem: Activity: Goal: Ability to ambulate and perform ADLs will improve Outcome: Completed/Met   Problem: Clinical  Measurements: Goal: Postoperative complications will be avoided or minimized Outcome: Completed/Met   Problem: Self-Concept: Goal: Ability to maintain and perform role responsibilities to the fullest extent possible will improve Outcome: Completed/Met   Problem: Pain Management: Goal: Pain level will decrease Outcome: Completed/Met

## 2024-10-10 NOTE — Progress Notes (Signed)
 PHARMACY - ANTICOAGULATION CONSULT NOTE  Pharmacy Consult for warfarin + enoxaparin  Indication: atrial fibrillation/Hx VTE  Allergies  Allergen Reactions   Tegretol [Carbamazepine] Rash   Patient Measurements: Height: 5' 5 (165.1 cm) Weight: 78.2 kg (172 lb 6.4 oz) IBW/kg (Calculated) : 57 HEPARIN DW (KG): 73.3  Vital Signs: Temp: 97.7 F (36.5 C) (10/28 0434) Temp Source: Oral (10/28 0434) BP: 92/61 (10/28 0434) Pulse Rate: 71 (10/28 0434)  Labs: Recent Labs    10/08/24 0528 10/09/24 0438 10/10/24 0439  HGB 8.4* 8.5* 8.2*  HCT 26.2* 26.6* 25.8*  PLT 214 238 248  LABPROT 14.4 15.0 16.9*  INR 1.1 1.1 1.3*   Estimated Creatinine Clearance: 60.9 mL/min (by C-G formula based on SCr of 0.71 mg/dL).   Assessment: Patient is a 84 YOF admitted for right femur fracture s/p OR intervention by orthopedics 10/24. Patient on warfarin PTA for Hx VTE/afib. Last dose warfarin 10/23. Initial INR on presentation 10/23 was 1.5. Patient's INR was previously therapeutic at INR 2.0 x2 visits on regimen of warfarin 7.5mg  tab, 1 tablet daily except for 1/2 tablet on Saturdays and Sundays (total weekly dose 45 mg). Pharmacy consulted to dose warfarin; ok to restart per orthopedics.  Pharmacy consulted to add VTE ppx dose enoxaparin  as well. Patient received phytonadione 10 mg  IV x 1 on 10/23.  INR up to 1.3 after avg 7.5mg /d x3d. Hgb stable post-op. PO intake not charted, on regular diet. No new DDI. Apixaban copay is $0, team suggests patient speak with PCP about switching to apixaban.    Goal of Therapy:  INR 2-3 Monitor platelets by anticoagulation protocol: Yes   Plan:  Warfarin 10 mg x1 today Stop enoxaparin  40mg  SQ daily when INR greater tha n2  Monitor daily INR, CBC, s/sx of bleed, PO intake/diet, Drug-Drug Interactions  Thank you for allowing pharmacy to be a part of this patient's care.   Jinnie Door, PharmD, BCPS, BCCP Clinical Pharmacist  Please check AMION for all [facility]  Pharmacy phone numbers After 10:00 PM, call Main Pharmacy [phone]

## 2024-10-10 NOTE — Discharge Summary (Signed)
 Physician Discharge Summary   Patient: Evelyn [name] MRN: [id number] DOB: 09/08/1946  Admit date:     10/05/2024  Discharge date: {dischdate:26783}  Discharge Physician: Concepcion Riser   PCP: Avva, Ravisankar, MD   Recommendations at discharge:  {Tip this will not be part of the note when signed- Example include specific recommendations for outpatient follow-up, pending tests to follow-up on. (Optional):26781}  ***  Discharge Diagnoses: Principal Problem:   Displaced intertrochanteric fracture of right femur, initial encounter for closed fracture (HCC) Active Problems:   History of DVT (deep vein thrombosis)   Atrial fibrillation (HCC)   History of pulmonary embolus (PE)   Focal epilepsy (HCC)   Memory impairment   Migraine   Closed comminuted intertrochanteric fracture of proximal end of right femur (HCC)  Resolved Problems:   Personal history of DVT (deep vein thrombosis)  Hospital Course: No notes on file  Assessment and Plan: No notes have been filed under this hospital service. Service: Hospitalist     {Tip this will not be part of the note when signed Body mass index is 28.69 kg/m. , ,  (Optional):26781}  {(NOTE) Pain control PDMP Statment (Optional):26782} Consultants: *** Procedures performed: ***  Disposition: {Plan; Disposition:26390} Diet recommendation:  Discharge Diet Orders (From admission, onward)     Start     Ordered   10/10/24 0000  Diet - low sodium heart healthy        10/10/24 1135           {Diet_Plan:26776} DISCHARGE MEDICATION: Allergies as of 10/10/2024       Reactions   Tegretol [carbamazepine] Rash        Medication List     STOP taking these medications    warfarin 7.5 MG tablet Commonly known as: COUMADIN        TAKE these medications    acetaminophen  500 MG tablet Commonly known as: TYLENOL  Take 500 mg by mouth every 6 (six) hours as needed. What changed: reasons to take this   apixaban 5 MG  Tabs tablet Commonly known as: ELIQUIS Take 1 tablet (5 mg total) by mouth 2 (two) times daily.   ascorbic acid 1000 MG tablet Commonly known as: VITAMIN C Take 1,000 mg by mouth daily.   B-12 2000 MCG Tabs Take 1 tablet by mouth daily.   Biotin 1 MG Caps Take 1 mg by mouth daily.   CALCIUM CITRATE + D PO Take 1 tablet by mouth 2 (two) times daily with a meal.   cetirizine 10 MG tablet Commonly known as: ZYRTEC Take 10 mg by mouth daily.   cholecalciferol 25 MCG (1000 UNIT) tablet Commonly known as: VITAMIN D3 Take 2,000 Units by mouth daily.   Coenzyme Q10 50 MG Caps Take 1 capsule by mouth every morning.   docusate sodium 100 MG capsule Commonly known as: COLACE Take 1 capsule (100 mg total) by mouth 2 (two) times daily.   lamoTRIgine  100 MG tablet Commonly known as: LAMICTAL  Take 0.5 tablets (50 mg total) by mouth 2 (two) times daily. What changed: additional instructions   lamoTRIgine  200 MG tablet Commonly known as: LAMICTAL  Take 1 tablet (200 mg total) by mouth 2 (two) times daily. What changed: additional instructions   mirtazapine  7.5 MG tablet Commonly known as: REMERON  Take 7.5 mg by mouth at bedtime.   multivitamin with minerals Tabs tablet Take 1 tablet by mouth daily.   Omega 3 1000 MG Caps Take 1 capsule by mouth every morning.   oxyCODONE  5 MG  immediate release tablet Commonly known as: Oxy IR/ROXICODONE  Take 1-2 tablets (5-10 mg total) by mouth every 8 (eight) hours as needed for severe pain (pain score 7-10).   Pataday 0.7 % Soln Generic drug: Olopatadine HCl Place 1 drop into both eyes daily as needed (allergies).   polyethylene glycol 17 g packet Commonly known as: MIRALAX  / GLYCOLAX  Take 17 g by mouth daily as needed for mild constipation.   Systane 0.4-0.3 % Soln Generic drug: Polyethyl Glycol-Propyl Glycol Place 1 drop into both eyes 2 (two) times daily as needed (dry eyes).   triamcinolone cream 0.1 % Commonly known as:  KENALOG Apply topically 2 (two) times daily.               Durable Medical Equipment  (From admission, onward)           Start     Ordered   10/10/24 1018  For home use only DME Bedside commode  Once       Comments: Please provide 3:1 - patient unable to ambulate to bathroom facilities  Question:  Patient needs a bedside commode to treat with the following condition  Answer:  Femur fracture, right (HCC)   10/10/24 1018   10/10/24 1017  For home use only DME Walker rolling  Once       Question Answer Comment  Walker: With 5 Inch Wheels   Patient needs a walker to treat with the following condition Femur fracture, right (HCC)      10/10/24 1018              Discharge Care Instructions  (From admission, onward)           Start     Ordered   10/10/24 0000  Leave dressing on - Keep it clean, dry, and intact until clinic visit        10/10/24 1135   10/06/24 0000  Weight bearing as tolerated        10/06/24 1212            Follow-up Information     Jule Ronal CROME, PA-C Follow up in 2 week(s).   Specialty: Orthopedic Surgery Why: For suture removal, For wound re-check Contact information: [address]  [place] [address] [phone]                Discharge Exam: Evelyn [name]   10/05/24 1951 10/06/24 1138  Weight: 78.2 kg 78.2 kg   ***  Condition at discharge: {DC Condition:26389}  The results of significant diagnostics from this hospitalization (including imaging, microbiology, ancillary and laboratory) are listed below for reference.   Imaging Studies: DG HIP UNILAT WITH PELVIS 2-3 VIEWS RIGHT Result Date: 10/06/2024 CLINICAL DATA:  Elective surgery. EXAM: DG HIP (WITH OR WITHOUT PELVIS) 2-3V*R* COMPARISON:  Preoperative imaging FINDINGS: Six fluoroscopic spot views of the right hip submitted from the operating room. Femoral intramedullary nail with trans trochanteric and distal locking screw fixation traverse proximal femur  fracture. Fluoroscopy time 78.3 seconds. Dose 23.5 mGy. IMPRESSION: Intraoperative fluoroscopy during right proximal femur fracture ORIF. Electronically Signed   By: Andrea Gasman M.D.   On: 10/06/2024 16:42   DG C-Arm 1-60 Min-No Report Result Date: 10/06/2024 Fluoroscopy was utilized by the requesting physician.  No radiographic interpretation.   DG FEMUR PORT, MIN 2 VIEWS RIGHT Result Date: 10/05/2024 CLINICAL DATA:  Fall.  Right hip pain. EXAM: RIGHT FEMUR PORTABLE 2 VIEW COMPARISON:  Pelvic CT dated 10/05/2024 FINDINGS: Comminuted and mildly displaced intertrochanteric fracture  of the right femur. No dislocation. The bones are osteopenic. The soft tissues are unremarkable. IMPRESSION: Comminuted and mildly displaced intertrochanteric fracture of the right femur. Electronically Signed   By: Vanetta Chou M.D.   On: 10/05/2024 17:18   CT Head Wo Contrast Result Date: 10/05/2024 EXAM: CT HEAD WITHOUT CONTRAST 10/05/2024 03:49:14 PM TECHNIQUE: CT of the head was performed without the administration of intravenous contrast. Automated exposure control, iterative reconstruction, and/or weight based adjustment of the mA/kV was utilized to reduce the radiation dose to as low as reasonably achievable. Examination is somewhat limited due to motion artifact. COMPARISON: 2017. CLINICAL HISTORY: Head trauma, minor (Age >= 65y). Trauma pt, altered and not holding still. FINDINGS: BRAIN AND VENTRICLES: No acute hemorrhage. No evidence of acute infarct. Prominence of the lateral ventricles at a proportion to the degree of sulcal enlargement. There is a borderline callosal angle. Evans index measures approximately 0.42. No extra-axial collection. No mass effect or midline shift. Mild chronic microvascular ischemic changes. ORBITS: No acute abnormality. SINUSES: No acute abnormality. SOFT TISSUES AND SKULL: No acute soft tissue abnormality. No skull fracture. IMPRESSION: 1. No acute intracranial abnormality. 2.  Prominence of the lateral ventricles which could reflect normal pressure hydrocephalus in the appropriate clinical context. 3. Mild chronic microvascular ischemic changes. Electronically signed by: Donnice Mania MD 10/05/2024 04:01 PM EDT RP Workstation: [id number]   CT PELVIS WO CONTRAST Result Date: 10/05/2024 CLINICAL DATA:  Trauma. EXAM: CT PELVIS WITHOUT CONTRAST TECHNIQUE: Multidetector CT imaging of the pelvis was performed following the standard protocol without intravenous contrast. RADIATION DOSE REDUCTION: This exam was performed according to the departmental dose-optimization program which includes automated exposure control, adjustment of the mA and/or kV according to patient size and/or use of iterative reconstruction technique. COMPARISON:  CT abdomen pelvis dated 10/08/2009. FINDINGS: Urinary Tract:  The urinary bladder is unremarkable. Bowel:  No dilated bowel loops.  The appendix is normal. Vascular/Lymphatic: Moderate aortoiliac atherosclerotic disease. No pelvic adenopathy. Reproductive: The uterus is grossly unremarkable. No suspicious adnexal masses. Other:  None Musculoskeletal: Comminuted and mildly displaced intertrochanteric fracture of the right femur with displaced fracture fragments of the greater and lesser trochanters. No dislocation. The bones are osteopenic. IMPRESSION: 1. Comminuted and mildly displaced intertrochanteric fracture of the right femur. 2.  Aortic Atherosclerosis (ICD10-I70.0). Electronically Signed   By: Vanetta Chou M.D.   On: 10/05/2024 15:59   LONG TERM MONITOR (3-14 DAYS) Result Date: 09/25/2024 Patch Wear Time:  13 days and 5 hours HR 59 - 200, average 79 bpm. 33 nonsustained SVT (longest 21 beats) No atrial fibrillation detected. Rare supraventricular ectopy. Rare ventricular ectopy. No sustained arrhythmias. No symptom trigger episodes. [facility] Cardiac Electrophysiology   Microbiology: Results for orders placed or performed during the  hospital encounter of 10/05/24  Surgical pcr screen     Status: None   Collection Time: 10/06/24  3:43 AM  Result Value Ref Range Status   MRSA, PCR NEGATIVE NEGATIVE Final   Staphylococcus aureus NEGATIVE NEGATIVE Final    Comment: (NOTE) The Xpert SA Assay (FDA approved for NASAL specimens in patients 36 years of age and older), is one component of a comprehensive surveillance program. It is not intended to diagnose infection nor to guide or monitor treatment. Performed at [facility], [address] [place], [place] [address]     Labs: CBC: Recent Labs  Lab 10/06/24 0046 10/07/24 0156 10/08/24 0528 10/09/24 0438 10/10/24 0439  WBC 12.7* 10.7* 13.2* 9.8 9.6  HGB 10.5* 9.8* 8.4* 8.5* 8.2*  HCT 33.1* 30.6* 26.2* 26.6* 25.8*  MCV 85.5 87.2 86.2 86.9 86.9  PLT 283 216 214 238 248   Basic Metabolic Panel: Recent Labs  Lab 10/05/24 1622 10/05/24 1645 10/06/24 0046  NA 137 138 135  K 4.1 4.2 4.0  CL 100 106 101  CO2 24  --  23  GLUCOSE 115* 115* 123*  BUN 20 24* 21  CREATININE 0.78 0.70 0.71  CALCIUM 9.2  --  8.7*   Liver Function Tests: Recent Labs  Lab 10/05/24 1622  AST 25  ALT 11  ALKPHOS 127*  BILITOT 0.4  PROT 7.3  ALBUMIN 3.4*   CBG: No results for input(s): GLUCAP in the last 168 hours.  Discharge time spent: {LESS THAN/GREATER UYJW:73611} 30 minutes.  Signed: Concepcion Riser, MD [facility] 10/10/2024

## 2024-10-10 NOTE — Progress Notes (Signed)
 Physical Therapy Treatment Patient Details Name: Evelyn [name] MRN: [id number] DOB: 03-Aug-1946 Today's Date: 10/10/2024   History of Present Illness 78 yo admitted 10/23 with displaced intertrochanteric fracture of the R femur after a fall. Underwent surgical repair 10/06/24.  PMH includes a-fib, PAD, focal epilepsy, memory impairment , DVT, PE and disequalibrium syndrome    PT Comments  Patient progressing and now planning home with spouse assist and HHPT.  She still needed mod A for sit to stand and multiple attempts and was unable to lift L foot to access step when attempting to practice for home entry.  Spouse states has a portable ramp and transport wheelchair.  Feel stable for home with spouse assistance and HHPT.   If plan is discharge home, recommend the following: A lot of help with walking and/or transfers;Assistance with cooking/housework;A lot of help with bathing/dressing/bathroom;Assist for transportation;Help with stairs or ramp for entrance   Can travel by private vehicle        Equipment Recommendations  Rolling walker (2 wheels);BSC/3in1    Recommendations for Other Services       Precautions / Restrictions Precautions Precautions: Fall Recall of Precautions/Restrictions: Intact Precaution/Restrictions Comments: WBAT     Mobility  Bed Mobility               General bed mobility comments: in recliner    Transfers Overall transfer level: Needs assistance Equipment used: Rolling walker (2 wheels) Transfers: Sit to/from Stand Sit to Stand: Mod assist           General transfer comment: increased time, cues to scoot to edge of chair, lifting help to stand (two attempts to achieve standing and cues for hand placement)    Ambulation/Gait Ambulation/Gait assistance: Contact guard assist Gait Distance (Feet): 12 Feet Assistive device: Rolling walker (2 wheels) Gait Pattern/deviations: Step-to pattern, Step-through pattern, Decreased stride  length, Antalgic       General Gait Details: cues for turning and backing up to wheelchair to sit to transport for stairs practice and assist for walker proximity   Stairs Stairs: Yes Stairs assistance: Mod assist Stair Management: One rail Right, With walker, Forwards, Backwards Number of Stairs: 1 General stair comments: attempted first with rail and HHA and pt stated unable to lift L foot high enough due to pain on R so attempted backwards with walker and pt unable to get L foot on step lifting up behind her.  Spouse reported they have portable ramp and transport chair to allow home entry   Wheelchair Mobility     Tilt Bed    Modified Rankin (Stroke Patients Only)       Balance Overall balance assessment: Needs assistance   Sitting balance-Leahy Scale: Good     Standing balance support: Bilateral upper extremity supported Standing balance-Leahy Scale: Poor                              Communication Communication Communication: No apparent difficulties  Cognition Arousal: Alert Behavior During Therapy: Anxious   PT - Cognitive impairments: Safety/Judgement, Problem solving                       PT - Cognition Comments: slow processing, mild fear of falling Following commands: Impaired Following commands impaired: Follows multi-step commands with increased time    Cueing    Exercises      General Comments General comments (skin integrity, edema, etc.): spouse present and  states plans for home with HHPT and eager for home; discussed practice with HHPT for stairs at home and use transport w/c and ramp for home entry today; had donned pants and A to pull up upon standing from chair      Pertinent Vitals/Pain Pain Assessment Pain Assessment: Faces Faces Pain Scale: Hurts even more Pain Location: Right hip with mobility Pain Descriptors / Indicators: Discomfort, Grimacing Pain Intervention(s): Monitored during session, Repositioned     Home Living                          Prior Function            PT Goals (current goals can now be found in the care plan section) Progress towards PT goals: Progressing toward goals    Frequency    Min 2X/week      PT Plan      Co-evaluation              AM-PAC PT 6 Clicks Mobility   Outcome Measure  Help needed turning from your back to your side while in a flat bed without using bedrails?: A Lot Help needed moving from lying on your back to sitting on the side of a flat bed without using bedrails?: A Lot Help needed moving to and from a bed to a chair (including a wheelchair)?: A Lot Help needed standing up from a chair using your arms (e.g., wheelchair or bedside chair)?: A Lot Help needed to walk in hospital room?: A Little Help needed climbing 3-5 steps with a railing? : Total 6 Click Score: 12    End of Session Equipment Utilized During Treatment: Gait belt Activity Tolerance: Patient limited by fatigue Patient left: in chair   PT Visit Diagnosis: Other abnormalities of gait and mobility (R26.89);Muscle weakness (generalized) (M62.81);History of falling (Z91.81);Pain Pain - Right/Left: Right Pain - part of body: Hip     Time: 1140-1210 PT Time Calculation (min) (ACUTE ONLY): 30 min  Charges:    $Gait Training: 8-22 mins $Self Care/Home Management: 8-22 PT General Charges $$ ACUTE PT VISIT: 1 Visit                     Micheline Portal, PT Acute Rehabilitation Services [phone] 10/10/2024    Montie Portal 10/10/2024, 12:25 PM

## 2024-10-10 NOTE — Progress Notes (Addendum)
 Transition of Care [facility]) - Inpatient Brief Assessment   Patient Details  Name: Evelyn [name] MRN: [id number] Date of Birth: February 13, 1946  [facility] CM/SW Contact:    Rosaline JONELLE Joe, RN Phone Number: 10/10/2024, 10:20 AM   Clinical Narrative: Patient admitted to the hospital with right femur fracture.  Patient lives with her spouse and plans to return home when stable.  DME at the home includes RW with no wheels and rolator.  I provided the patient with Medicare choice regarding home health and DME and patient did not have a preference.  I called [facility], [facility] services for PT, OT.  HH orders placed - to be co-signed by MD.  [facility] called and requested delivery of RW and 3:1 to the bedside.  Patient husband plans to provide transportation to home by car.  10/10/24-  [employer] was called again to please deliver the RW and 3:1 now to the discharge lounge.  Jermain, CM with [employer] is aware - [phone].   Transition of Care Asessment: Insurance and Status: (P) Insurance coverage has been reviewed Patient has primary care physician: (P) Yes Home environment has been reviewed: (P) from home with spouse Prior level of function:: (P) self Prior/Current Home Services: (P) No current home services (DME at home includes walker without wheels, rolator)   Readmission risk has been reviewed: (P) Yes Transition of care needs: (P) transition of care needs identified, TOC will continue to follow

## 2024-10-12 ENCOUNTER — Telehealth: Payer: Self-pay | Admitting: Orthopaedic Surgery

## 2024-10-12 NOTE — Telephone Encounter (Signed)
 Bernarda with [facility] called needing verbal delay start of care for tomorrow 10/13/24  [phone]

## 2024-10-13 NOTE — Telephone Encounter (Signed)
 Gave OK via email with [facility]. Number in message didn't allow me to speak with therapist that called.

## 2024-10-16 ENCOUNTER — Encounter: Payer: Self-pay | Admitting: Radiology

## 2024-10-20 ENCOUNTER — Ambulatory Visit

## 2024-10-24 ENCOUNTER — Other Ambulatory Visit: Payer: Self-pay

## 2024-10-24 ENCOUNTER — Ambulatory Visit: Admitting: Physician Assistant

## 2024-10-24 DIAGNOSIS — M25551 Pain in right hip: Secondary | ICD-10-CM

## 2024-10-24 NOTE — Progress Notes (Signed)
 Post-Op Visit Note   Patient: Evelyn [name]           Date of Birth: [birth year]           MRN: [id number] Visit Date: 10/24/2024 PCP: Janey Santos, MD   Assessment & Plan:  Chief Complaint:  Chief Complaint  Patient presents with   Right Hip - Pain   Visit Diagnoses:  1. Pain in right hip     Plan: Patient is a pleasant 78 year old female who comes in today 2-1/2 weeks status post right hip IM nail for intertrochanteric femur fracture.  She has been doing okay.  She has been getting home health PT and is ambulating with a walker.  She has been taking oxycodone  and Tylenol  for pain.  She is now on Eliquis instead of Coumadin  for DVT prophylaxis.  Examination of her right hip reveals well-healed surgical incisions without complication.  Calves are soft nontender.  She is neurovascular intact distally.  Today, staples were removed and Steri-Strips applied.  She will continue with her home health PT.  She will continue with her Eliquis per hospitalist.  I will have her touch base with her cardiologist or PCP for further management as it sounds like she has been on Coumadin  for 10 years prior and has now switched to Eliquis indefinitely for history of DVTs.  She will follow-up in 4 weeks for repeat evaluation and x-rays of the right hip.  Call with concerns or questions at any time.  Follow-Up Instructions: Return in about 4 weeks (around 11/21/2024) for with xu.   Orders:  Orders Placed This Encounter  Procedures   XR FEMUR, MIN 2 VIEWS RIGHT   No orders of the defined types were placed in this encounter.   Imaging: XR FEMUR, MIN 2 VIEWS RIGHT Result Date: 10/24/2024 X-rays demonstrate stable alignment of the fracture without hardware complication   PMFS History: Patient Active Problem List   Diagnosis Date Noted   Displaced intertrochanteric fracture of right femur, initial encounter for closed fracture (HCC) 10/05/2024   History of pulmonary embolus (PE) 10/05/2024    Migraine 10/05/2024   Closed comminuted intertrochanteric fracture of proximal end of right femur (HCC) 10/05/2024   Headache 01/26/2024   Peripheral vascular disease 01/26/2024   Memory impairment 09/17/2021   Cerebral ventriculomegaly 11/11/2020   Atrial fibrillation (HCC) 09/27/2020   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   Focal epilepsy (HCC) 12/24/2014   Chronic anticoagulation 07/13/2014   History of DVT (deep vein thrombosis)    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, but unable to be removed, being [facility]   Elevated troponin 08/22/2024   Headache(784.0)    History of echocardiogram 03/11/2009   nl EF 60-65%   History of recurrent UTIs    feels like may have one now   PE (pulmonary embolism) 12/15/2003   Peripheral vascular disease    dvt leg rt   Seizures (HCC)     Family History  Problem Relation Age of Onset   Diabetes Mother    Stomach cancer Mother  Stroke Father    Esophageal cancer Neg Hx    Colon cancer Neg Hx    Liver disease Neg Hx    Pancreatic cancer Neg Hx     Past Surgical History:  Procedure Laterality Date   arm surgery Right    INSERTION OF VENA CAVA FILTER  10   INTRAMEDULLARY (IM) NAIL INTERTROCHANTERIC Right 10/06/2024   Procedure: FIXATION, FRACTURE, INTERTROCHANTERIC, WITH INTRAMEDULLARY ROD;  Surgeon: Jerri Kay HERO, MD;  Location: [facility] OR;  Service: Orthopedics;  Laterality: Right;   KNEE SURGERY Left 96   kneecap fx   ORIF HUMERUS FRACTURE Right 08/01/2013   Procedure: NON-UNION REPAIR RIGHT PROXIMAL  HUMERUS FRACTURE;  Surgeon: Ozell VEAR Bruch, MD;  Location: [facility] OR;  Service: Orthopedics;  Laterality: Right;   Social History   Occupational History   Not on file  Tobacco Use   Smoking status: Never   Smokeless tobacco: Never  Substance and Sexual Activity   Alcohol use: No   Drug use: No   Sexual activity: Not on file

## 2024-11-03 ENCOUNTER — Encounter: Payer: Self-pay | Admitting: Orthopaedic Surgery

## 2024-11-03 MED ORDER — HYDROCODONE-ACETAMINOPHEN 5-325 MG PO TABS: 1.0000 | ORAL_TABLET | Freq: Every day | ORAL | 0 refills | Status: DC | PRN

## 2024-11-21 ENCOUNTER — Ambulatory Visit: Admitting: Orthopaedic Surgery

## 2024-11-21 ENCOUNTER — Other Ambulatory Visit: Payer: Self-pay

## 2024-11-21 DIAGNOSIS — S72141A Displaced intertrochanteric fracture of right femur, initial encounter for closed fracture: Secondary | ICD-10-CM

## 2024-11-21 DIAGNOSIS — M25551 Pain in right hip: Secondary | ICD-10-CM

## 2024-11-21 NOTE — Progress Notes (Signed)
 Post-Op Visit Note   Patient: Evelyn [name]           Date of Birth: 1946/10/31           MRN: [id number] Visit Date: 11/21/2024 PCP: Janey Santos, MD   Assessment & Plan:  Chief Complaint:  Chief Complaint  Patient presents with   Right Hip - Follow-up    IM intertroch fracture 10/06/2024   Visit Diagnoses:  1. Displaced intertrochanteric fracture of right femur, initial encounter for closed fracture (HCC)   2. Pain in right hip     Plan: History of Present Illness Evelyn [name] is a 78 year old female who presents for follow-up after hip fracture surgery. She is accompanied by her husband.    She is six weeks post hip fracture surgery. She attends physical therapy and has hip pain with exertion and during therapy that is usually controlled with Tylenol .  She is worried about walking without her walker. She can walk more normally on some days but relies on the walker due to fear of falling again. Her therapist is working with her on exercises and supported walking to help her transition off the walker.  Results RADIOLOGY Hip X-ray: Healed fracture with intact CM implant.  Assessment and Plan Routine healing of right intertrochanteric femur fracture Six weeks post-surgery with healing progress on X-rays. Pain manageable with Tylenol . Full recovery expected in one year. Psychological factors may affect walker transition. - Continue physical therapy for strength and mobility. - Use Tylenol  for pain as needed. - Encouraged gradual transition from walker to independent walking as confidence improves.  Follow-Up Instructions: Return if symptoms worsen or fail to improve.   Orders:  Orders Placed This Encounter  Procedures   XR HIP UNILAT W OR W/O PELVIS 2-3 VIEWS RIGHT   No orders of the defined types were placed in this encounter.   Imaging: XR HIP UNILAT W OR W/O PELVIS 2-3 VIEWS RIGHT Result Date: 11/21/2024 Xrays show a healed right  intertrochanteric femur fracture with intact CMN.   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   Chronic anticoagulation 07/13/2014   History of DVT (deep vein thrombosis)    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, but unable to be removed, being treated by Dr Darcy at [facility]   Elevated troponin 08/22/2024   Headache(784.0)    History of echocardiogram 03/11/2009   nl EF 60-65%   History of recurrent UTIs    feels like may have one now   PE (pulmonary embolism) 12/15/2003   Peripheral vascular disease  dvt leg rt   Seizures (HCC)     Family History  Problem Relation Age of Onset   Diabetes Mother    Stomach cancer Mother    Stroke Father    Esophageal cancer Neg Hx    Colon cancer Neg Hx    Liver disease Neg Hx    Pancreatic cancer Neg Hx     Past Surgical History:  Procedure Laterality Date   arm surgery Right    INSERTION OF VENA CAVA FILTER  10   INTRAMEDULLARY (IM) NAIL INTERTROCHANTERIC Right 10/06/2024   Procedure:  FIXATION, FRACTURE, INTERTROCHANTERIC, WITH INTRAMEDULLARY ROD;  Surgeon: Jerri Kay HERO, MD;  Location: [facility] OR;  Service: Orthopedics;  Laterality: Right;   KNEE SURGERY Left 96   kneecap fx   ORIF HUMERUS FRACTURE Right 08/01/2013   Procedure: NON-UNION REPAIR RIGHT PROXIMAL HUMERUS FRACTURE;  Surgeon: Ozell VEAR Bruch, MD;  Location: [facility] OR;  Service: Orthopedics;  Laterality: Right;   Social History   Occupational History   Not on file  Tobacco Use   Smoking status: Never   Smokeless tobacco: Never  Substance and Sexual Activity   Alcohol use: No   Drug use: No   Sexual activity: Not on file

## 2024-12-08 ENCOUNTER — Telehealth: Payer: Self-pay | Admitting: Orthopaedic Surgery

## 2024-12-08 NOTE — Telephone Encounter (Signed)
 Debbie from [facility] called saying that pt missed an apt due to sickness and needs to do the discharge apt next week. Call back number is [phone]

## 2025-01-15 ENCOUNTER — Encounter: Payer: Self-pay | Admitting: Orthopaedic Surgery

## 2025-01-15 ENCOUNTER — Other Ambulatory Visit: Payer: Self-pay | Admitting: Physician Assistant

## 2025-01-15 MED ORDER — HYDROCODONE-ACETAMINOPHEN 5-325 MG PO TABS: 1.0000 | ORAL_TABLET | Freq: Every day | ORAL | 0 refills | Status: AC | PRN

## 2025-01-16 ENCOUNTER — Other Ambulatory Visit: Payer: Self-pay

## 2025-01-16 DIAGNOSIS — M25551 Pain in right hip: Secondary | ICD-10-CM

## 2025-01-16 DIAGNOSIS — S72141A Displaced intertrochanteric fracture of right femur, initial encounter for closed fracture: Secondary | ICD-10-CM

## 2025-01-16 NOTE — Telephone Encounter (Signed)
 PT Order placed

## 2025-03-08 ENCOUNTER — Ambulatory Visit (HOSPITAL_COMMUNITY): Admitting: Internal Medicine

## 2025-03-29 ENCOUNTER — Ambulatory Visit: Admitting: Neurology
